# Patient Record
Sex: Female | Born: 1980 | Race: Black or African American | Hispanic: No | Marital: Single | Smoking: Former smoker
Health system: Southern US, Community
[De-identification: ages and names within clinical notes are randomized; demographics above are authoritative.]

## PROBLEM LIST (undated history)

## (undated) DIAGNOSIS — C801 Malignant (primary) neoplasm, unspecified: Secondary | ICD-10-CM

## (undated) DIAGNOSIS — N938 Other specified abnormal uterine and vaginal bleeding: Secondary | ICD-10-CM

## (undated) DIAGNOSIS — D649 Anemia, unspecified: Secondary | ICD-10-CM

## (undated) DIAGNOSIS — R1900 Intra-abdominal and pelvic swelling, mass and lump, unspecified site: Secondary | ICD-10-CM

## (undated) DIAGNOSIS — F32A Depression, unspecified: Secondary | ICD-10-CM

## (undated) DIAGNOSIS — R590 Localized enlarged lymph nodes: Secondary | ICD-10-CM

## (undated) HISTORY — PX: TONSILLECTOMY: SUR1361

---

## 2014-03-04 ENCOUNTER — Emergency Department (HOSPITAL_COMMUNITY)
Admission: EM | Admit: 2014-03-04 | Discharge: 2014-03-04 | Disposition: A | Discharge status: Home / Self Care | Payer: Managed Care, Other (non HMO) | Attending: Emergency Medicine | Admitting: Emergency Medicine

## 2014-03-04 ENCOUNTER — Emergency Department (HOSPITAL_COMMUNITY): Payer: Managed Care, Other (non HMO)

## 2014-03-04 ENCOUNTER — Encounter (HOSPITAL_COMMUNITY): Payer: Self-pay | Admitting: Emergency Medicine

## 2014-03-04 DIAGNOSIS — Z720 Tobacco use: Secondary | ICD-10-CM | POA: Diagnosis not present

## 2014-03-04 DIAGNOSIS — S39012A Strain of muscle, fascia and tendon of lower back, initial encounter: Secondary | ICD-10-CM | POA: Insufficient documentation

## 2014-03-04 DIAGNOSIS — W500XXA Accidental hit or strike by another person, initial encounter: Secondary | ICD-10-CM | POA: Diagnosis not present

## 2014-03-04 DIAGNOSIS — R52 Pain, unspecified: Secondary | ICD-10-CM

## 2014-03-04 DIAGNOSIS — S3992XA Unspecified injury of lower back, initial encounter: Secondary | ICD-10-CM | POA: Diagnosis present

## 2014-03-04 DIAGNOSIS — Y9361 Activity, american tackle football: Secondary | ICD-10-CM | POA: Diagnosis not present

## 2014-03-04 DIAGNOSIS — Y92321 Football field as the place of occurrence of the external cause: Secondary | ICD-10-CM | POA: Insufficient documentation

## 2014-03-04 MED ORDER — CYCLOBENZAPRINE HCL 10 MG PO TABS: 10.0000 mg | ORAL_TABLET | Freq: Two times a day (BID) | ORAL | Status: DC | PRN

## 2014-03-04 NOTE — ED Provider Notes (Signed)
CSN: [id number]     Arrival date & time 03/04/14  0148 History   First MD Initiated Contact with Patient 03/04/14 [phone]     Chief Complaint  Patient presents with  . Back Pain      HPI Patient states that she was on the sideline football game with her sons yesterday.  She was struck and knocked down.  She initially had no pain.  About an hour or 2 later she developed some burning sensation in her low back with some pain going down right leg.  She denies any weakness or bowel or bladder incontinence. History reviewed. No pertinent past medical history. Past Surgical History  Procedure Laterality Date  . Cesarean section     No family history on file. History  Substance Use Topics  . Smoking status: Current Every Day Smoker -- 0.50 packs/day  . Smokeless tobacco: Not on file  . Alcohol Use: No   OB History   Grav Para Term Preterm Abortions TAB SAB Ect Mult Living                 Review of Systems  All other systems reviewed and are negative  Allergies  Review of patient's allergies indicates no known allergies.  Home Medications   Prior to Admission medications   Medication Sig Start Date End Date Taking? Authorizing Provider  cetirizine (ZYRTEC) 10 MG tablet Take 10 mg by mouth daily as needed for allergies.   Yes Historical Provider, MD  ibuprofen (ADVIL,MOTRIN) 200 MG tablet Take 800 mg by mouth every 6 (six) hours as needed for moderate pain.   Yes Historical Provider, MD  cyclobenzaprine (FLEXERIL) 10 MG tablet Take 1 tablet (10 mg total) by mouth 2 (two) times daily as needed for muscle spasms. 03/04/14   Dot Lanes, MD   BP 128/84  Pulse 64  Temp(Src) 98.4 F (36.9 C) (Oral)  Resp 16  SpO2 100%  LMP 02/18/2014 Physical Exam Physical Exam  Nursing note and vitals reviewed. Constitutional: She is oriented to person, place, and time. She appears well-developed and well-nourished. No distress.  HENT:  Head: Normocephalic and atraumatic.  Eyes: Pupils are  equal, round, and reactive to light.  Neck: Normal range of motion.  Cardiovascular: Normal rate and intact distal pulses.   Pulmonary/Chest: No respiratory distress.  Abdominal: Normal appearance. She exhibits no distension.  Musculoskeletal: Paraspinous muscular tenderness noted in the lumbar region. Neurological: She is alert and oriented to person, place, and time. No cranial nerve deficit.  no weakness in lower extremities.  Straight leg raises are negative. Skin: Skin is warm and dry. No rash noted.  Psychiatric: She has a normal mood and affect. Her behavior is normal.   ED Course  Procedures (including critical care time) Labs Review Labs Reviewed - No data to display  Imaging Review Dg Lumbar Spine Complete  03/04/2014   CLINICAL DATA:  Patient fell straight backwards yesterday. Back pain with radiation down the right leg. Walks with a limp.  EXAM: LUMBAR SPINE - COMPLETE 4+ VIEW  COMPARISON:  None.  FINDINGS: Sacralization of L5. Go normal alignment of the lumbar spine and facet joints. No vertebral compression deformities. Intervertebral disc space heights are preserved. No focal bone lesion or bone destruction. Bone cortex and trabecular architecture appear intact. Intrauterine device demonstrated in the pelvis.  IMPRESSION: Normal alignment of the lumbar spine. No displaced fractures identified.   Electronically Signed   By: Lucienne Capers M.D.   On: 03/04/2014 04:20  MDM   Final diagnoses:  Lumbar strain, initial encounter        Dot Lanes, MD 03/05/14 2102

## 2014-03-04 NOTE — ED Notes (Signed)
MD/PA's made aware of pt needing to be seen.

## 2014-03-04 NOTE — Discharge Instructions (Signed)
Back Pain, Adult Low back pain is very common. About 1 in 5 people have back pain.The cause of low back pain is rarely dangerous. The pain often gets better over time.About half of people with a sudden onset of back pain feel better in just 2 weeks. About 8 in 10 people feel better by 6 weeks.  CAUSES Some common causes of back pain include:  Strain of the muscles or ligaments supporting the spine.  Wear and tear (degeneration) of the spinal discs.  Arthritis.  Direct injury to the back. DIAGNOSIS Most of the time, the direct cause of low back pain is not known.However, back pain can be treated effectively even when the exact cause of the pain is unknown.Answering your caregiver's questions about your overall health and symptoms is one of the most accurate ways to make sure the cause of your pain is not dangerous. If your caregiver needs more information, he or she may order lab work or imaging tests (X-rays or MRIs).However, even if imaging tests show changes in your back, this usually does not require surgery. HOME CARE INSTRUCTIONS For many people, back pain returns.Since low back pain is rarely dangerous, it is often a condition that people can learn to manageon their own.   Remain active. It is stressful on the back to sit or stand in one place. Do not sit, drive, or stand in one place for more than 30 minutes at a time. Take short walks on level surfaces as soon as pain allows.Try to increase the length of time you walk each day.  Do not stay in bed.Resting more than 1 or 2 days can delay your recovery.  Do not avoid exercise or work.Your body is made to move.It is not dangerous to be active, even though your back may hurt.Your back will likely heal faster if you return to being active before your pain is gone.  Pay attention to your body when you bend and lift. Many people have less discomfortwhen lifting if they bend their knees, keep the load close to their bodies,and  avoid twisting. Often, the most comfortable positions are those that put less stress on your recovering back.  Find a comfortable position to sleep. Use a firm mattress and lie on your side with your knees slightly bent. If you lie on your back, put a pillow under your knees.  Only take over-the-counter or prescription medicines as directed by your caregiver. Over-the-counter medicines to reduce pain and inflammation are often the most helpful.Your caregiver may prescribe muscle relaxant drugs.These medicines help dull your pain so you can more quickly return to your normal activities and healthy exercise.  Put ice on the injured area.  Put ice in a plastic bag.  Place a towel between your skin and the bag.  Leave the ice on for 15-20 minutes, 03-04 times a day for the first 2 to 3 days. After that, ice and heat may be alternated to reduce pain and spasms.  Ask your caregiver about trying back exercises and gentle massage. This may be of some benefit.  Avoid feeling anxious or stressed.Stress increases muscle tension and can worsen back pain.It is important to recognize when you are anxious or stressed and learn ways to manage it.Exercise is a great option. SEEK MEDICAL CARE IF:  You have pain that is not relieved with rest or medicine.  You have pain that does not improve in 1 week.  You have new symptoms.  You are generally not feeling well. SEEK   IMMEDIATE MEDICAL CARE IF:   You have pain that radiates from your back into your legs.  You develop new bowel or bladder control problems.  You have unusual weakness or numbness in your arms or legs.  You develop nausea or vomiting.  You develop abdominal pain.  You feel faint. Document Released: 04/27/2005 Document Revised: 10/27/2011 Document Reviewed: 08/29/2013 [employer] Patient Information 2015 [employer]. This information is not intended to replace advice given to you by your health care provider. Make sure you  discuss any questions you have with your health care provider.  

## 2014-03-04 NOTE — ED Notes (Signed)
Pt reports falling backwards yesterday.  Reports low back pain with a "knot".  Pt ambulatory with a limp

## 2014-10-28 ENCOUNTER — Encounter (HOSPITAL_COMMUNITY): Payer: Self-pay | Admitting: *Deleted

## 2014-10-28 ENCOUNTER — Emergency Department (HOSPITAL_COMMUNITY)
Admission: EM | Admit: 2014-10-28 | Discharge: 2014-10-28 | Disposition: A | Discharge status: Home / Self Care | Payer: Managed Care, Other (non HMO) | Attending: Emergency Medicine | Admitting: Emergency Medicine

## 2014-10-28 ENCOUNTER — Emergency Department (HOSPITAL_COMMUNITY): Payer: Managed Care, Other (non HMO)

## 2014-10-28 DIAGNOSIS — Z79899 Other long term (current) drug therapy: Secondary | ICD-10-CM | POA: Insufficient documentation

## 2014-10-28 DIAGNOSIS — S4991XA Unspecified injury of right shoulder and upper arm, initial encounter: Secondary | ICD-10-CM | POA: Insufficient documentation

## 2014-10-28 DIAGNOSIS — Z720 Tobacco use: Secondary | ICD-10-CM | POA: Insufficient documentation

## 2014-10-28 DIAGNOSIS — W51XXXA Accidental striking against or bumped into by another person, initial encounter: Secondary | ICD-10-CM | POA: Insufficient documentation

## 2014-10-28 DIAGNOSIS — M25511 Pain in right shoulder: Secondary | ICD-10-CM

## 2014-10-28 DIAGNOSIS — Y9361 Activity, american tackle football: Secondary | ICD-10-CM | POA: Insufficient documentation

## 2014-10-28 DIAGNOSIS — Y92321 Football field as the place of occurrence of the external cause: Secondary | ICD-10-CM | POA: Insufficient documentation

## 2014-10-28 DIAGNOSIS — Y998 Other external cause status: Secondary | ICD-10-CM | POA: Insufficient documentation

## 2014-10-28 MED ORDER — TRAMADOL HCL 50 MG PO TABS: 50.0000 mg | ORAL_TABLET | Freq: Four times a day (QID) | ORAL | Status: DC | PRN

## 2014-10-28 MED ORDER — DICLOFENAC SODIUM 1 % TD GEL: 2.0000 g | Freq: Four times a day (QID) | TRANSDERMAL | Status: DC

## 2014-10-28 MED ORDER — DIAZEPAM 5 MG PO TABS: 5.0000 mg | ORAL_TABLET | Freq: Two times a day (BID) | ORAL | Status: DC

## 2014-10-28 MED ORDER — KETOROLAC TROMETHAMINE 60 MG/2ML IM SOLN
60.0000 mg | Freq: Once | INTRAMUSCULAR | Status: AC
  Administered 2014-10-28: 60 mg via INTRAMUSCULAR
  Filled 2014-10-28: qty 2

## 2014-10-28 NOTE — ED Provider Notes (Signed)
CSN: [id number]     Arrival date & time 10/28/14  1922 History   First MD Initiated Contact with Patient 10/28/14 2016     Chief Complaint  Patient presents with  . Shoulder Injury     (Consider location/radiation/quality/duration/timing/severity/associated sxs/prior Treatment) HPI Comments: 34 year old female with no sick and past medical history presents to the emergency department for further evaluation of right shoulder pain. Patient reports that she was playing football with her children 2 days ago when she was tackled to the ground. She reports falling on her back; she does not recall whether she fell more on her right side or right shoulder. She states that her shoulder began hurting her yesterday and the pain has been constant. She describes the pain as a throbbing, tightening pain. She will intermittently feel paresthesias down her right arm. She denies any complete numbness or weakness in her right upper extremity. She has been taking Tylenol and ibuprofen without relief of symptoms. No history of prior injury to right shoulder.  Patient is a 34 y.o. female presenting with shoulder injury. The history is provided by the patient. No language interpreter was used.  Shoulder Injury Associated symptoms include arthralgias and myalgias. Pertinent negatives include no numbness or weakness.    History reviewed. No pertinent past medical history. Past Surgical History  Procedure Laterality Date  . Cesarean section     No family history on file. History  Substance Use Topics  . Smoking status: Current Every Day Smoker -- 0.50 packs/day  . Smokeless tobacco: Not on file  . Alcohol Use: No   OB History    No data available      Review of Systems  Musculoskeletal: Positive for myalgias and arthralgias.  Neurological: Negative for weakness and numbness.       Positive for paresthesias  All other systems reviewed and are negative.   Allergies  Review of patient's allergies  indicates no known allergies.  Home Medications   Prior to Admission medications   Medication Sig Start Date End Date Taking? Authorizing Provider  cetirizine (ZYRTEC) 10 MG tablet Take 10 mg by mouth daily as needed for allergies.    Historical Provider, MD  cyclobenzaprine (FLEXERIL) 10 MG tablet Take 1 tablet (10 mg total) by mouth 2 (two) times daily as needed for muscle spasms. 03/04/14   Leonard Schwartz, MD  diazepam (VALIUM) 5 MG tablet Take 1 tablet (5 mg total) by mouth 2 (two) times daily. 10/28/14   Antonietta Breach, PA-C  diclofenac sodium (VOLTAREN) 1 % GEL Apply 2 g topically 4 (four) times daily. 10/28/14   Antonietta Breach, PA-C  ibuprofen (ADVIL,MOTRIN) 200 MG tablet Take 800 mg by mouth every 6 (six) hours as needed for moderate pain.    Historical Provider, MD  traMADol (ULTRAM) 50 MG tablet Take 1 tablet (50 mg total) by mouth every 6 (six) hours as needed. 10/28/14   Antonietta Breach, PA-C   BP 138/82 mmHg  Pulse 93  Temp(Src) 98.4 F (36.9 C) (Oral)  Resp 18  Ht 6' (1.829 m)  Wt 209 lb 9 oz (95.057 kg)  BMI 28.42 kg/m2  SpO2 100%  LMP 10/28/2014   Physical Exam  Constitutional: She is oriented to person, place, and time. She appears well-developed and well-nourished. No distress.  Nontoxic/nonseptic appearing.  HENT:  Head: Normocephalic and atraumatic.  Eyes: Conjunctivae and EOM are normal. No scleral icterus.  Neck: Normal range of motion.  Cardiovascular: Normal rate, regular rhythm and intact distal pulses.  Distal radial pulse 2+ in the right upper extremity. Capillary refill brisk in all digits of right hand.  Pulmonary/Chest: Effort normal. No respiratory distress.  Musculoskeletal:       Right shoulder: She exhibits decreased range of motion (Decreased active range of motion of the right shoulder past 90 secondary to discomfort), tenderness, pain and spasm. She exhibits no bony tenderness, no swelling, no crepitus, no deformity, normal pulse and normal strength.        Cervical back: Normal.       Arms: Tenderness to palpation to posterior right shoulder with spasm. No crepitus or deformity noted. No bony tenderness.  Neurological: She is alert and oriented to person, place, and time. She exhibits normal muscle tone. Coordination normal.  Sensation to light touch intact in bilateral upper extremities. Patient has 5/5 grip strength and strength against resistance in all major muscle groups in the bilateral upper extremities. GCS 15.  Skin: Skin is warm and dry. No rash noted. She is not diaphoretic. No erythema. No pallor.  Psychiatric: She has a normal mood and affect. Her behavior is normal.  Nursing note and vitals reviewed.   ED Course  Procedures (including critical care time) Labs Review Labs Reviewed - No data to display  Imaging Review Dg Shoulder Right  10/28/2014   CLINICAL DATA:  Tackled playing football with right shoulder pain. Initial encounter.  EXAM: RIGHT SHOULDER - 2+ VIEW  COMPARISON:  None.  FINDINGS: There is no evidence of fracture or dislocation. Soft tissues are unremarkable.  IMPRESSION: Negative.   Electronically Signed   By: Monte Fantasia M.D.   On: 10/28/2014 20:01     EKG Interpretation None      MDM   Final diagnoses:  Right shoulder pain    34 year old female presents to the emergency department for further evaluation of right shoulder pain or playing tackle football 2 days ago. Patient is neurovascularly intact. X-ray negative for fracture, dislocation, or bony deformity. Patient exhibits good passive range of motion of her right shoulder joint. There is a muscle spasm appreciated on exam. Symptoms consistent with MSK etiology. Will manage as outpatient with Voltaren gel and Valium. Short course of Tramadol given for pain. Orthopedic referral given should symptoms persist. Return precautions discussed and provided. Patient agreeable to plan with no unaddressed concerns. Patient discharged in good condition.   Filed  Vitals:   10/28/14 1926 10/28/14 2045  BP: 138/82 128/78  Pulse: 93 70  Temp: 98.4 F (36.9 C) 98.4 F (36.9 C)  TempSrc: Oral Oral  Resp: 18 18  Height: 6' (1.829 m)   Weight: 209 lb 9 oz (95.057 kg)   SpO2: 100% 100%     Antonietta Breach, PA-C 10/28/14 2049  Dorie Rank, MD 10/29/14 1226

## 2014-10-28 NOTE — Discharge Instructions (Signed)
Recommend the use of Voltaren gel and Valium as prescribed. You may take tramadol as needed for severe pain. Alternate ice and heat to areas of injury 3-4 times per day for 15-20 minutes each time. Recommend frequent stretching of your right shoulder. Follow-up with orthopedics if symptoms persist.  Shoulder Sprain A shoulder sprain is the result of damage to the tough, fiber-like tissues (ligaments) that help hold your shoulder in place. The ligaments may be stretched or torn. Besides the main shoulder joint (the ball and socket), there are several smaller joints that connect the bones in this area. A sprain usually involves one of those joints. Most often it is the acromioclavicular (or AC) joint. That is the joint that connects the collarbone (clavicle) and the shoulder blade (scapula) at the top point of the shoulder blade (acromion). A shoulder sprain is a mild form of what is called a shoulder separation. Recovering from a shoulder sprain may take some time. For some, pain lingers for several months. Most people recover without long term problems. CAUSES   A shoulder sprain is usually caused by some kind of trauma. This might be:  Falling on an outstretched arm.  Being hit hard on the shoulder.  Twisting the arm.  Shoulder sprains are more likely to occur in people who:  Play sports.  Have balance or coordination problems. SYMPTOMS   Pain when you move your shoulder.  Limited ability to move the shoulder.  Swelling and tenderness on top of the shoulder.  Redness or warmth in the shoulder.  Bruising.  A change in the shape of the shoulder. DIAGNOSIS  Your healthcare provider may:  Ask about your symptoms.  Ask about recent activity that might have caused those symptoms.  Examine your shoulder. You may be asked to do simple exercises to test movement. The other shoulder will be examined for comparison.  Order some tests that provide a look inside the body. They can show  the extent of the injury. The tests could include:  X-rays.  CT (computed tomography) scan.  MRI (magnetic resonance imaging) scan. RISKS AND COMPLICATIONS  Loss of full shoulder motion.  Ongoing shoulder pain. TREATMENT  How long it takes to recover from a shoulder sprain depends on how severe it was. Treatment options may include:  Rest. You should not use the arm or shoulder until it heals.  Ice. For 2 or 3 days after the injury, put an ice pack on the shoulder up to 4 times a day. It should stay on for 15 to 20 minutes each time. Wrap the ice in a towel so it does not touch your skin.  Over-the-counter medicine to relieve pain.  A sling or brace. This will keep the arm still while the shoulder is healing.  Physical therapy or rehabilitation exercises. These will help you regain strength and motion. Ask your healthcare provider when it is OK to begin these exercises.  Surgery. The need for surgery is rare with a sprained shoulder, but some people may need surgery to keep the joint in place and reduce pain. HOME CARE INSTRUCTIONS   Ask your healthcare provider about what you should and should not do while your shoulder heals.  Make sure you know how to apply ice to the correct area of your shoulder.  Talk with your healthcare provider about which medications should be used for pain and swelling.  If rehabilitation therapy will be needed, ask your healthcare provider to refer you to a therapist. If it is  not recommended, then ask about at-home exercises. Find out when exercise should begin. SEEK MEDICAL CARE IF:  Your pain, swelling, or redness at the joint increases. SEEK IMMEDIATE MEDICAL CARE IF:   You have a fever.  You cannot move your arm or shoulder. Document Released: 09/13/2008 Document Revised: 07/20/2011 Document Reviewed: 09/13/2008 [facility] Patient Information 2015 [place], [place]. This information is not intended to replace advice given to you by your health  care provider. Make sure you discuss any questions you have with your health care provider.

## 2014-10-28 NOTE — ED Notes (Signed)
The pt is c/o rt shoulder pain since Friday when she was playing football with her children.  lmp now

## 2014-10-28 NOTE — ED Notes (Signed)
Pt. Left with all belongings and refused wheelchair 

## 2015-01-04 ENCOUNTER — Emergency Department (HOSPITAL_COMMUNITY)
Admission: EM | Admit: 2015-01-04 | Discharge: 2015-01-04 | Disposition: A | Discharge status: Home / Self Care | Payer: Managed Care, Other (non HMO) | Attending: Emergency Medicine | Admitting: Emergency Medicine

## 2015-01-04 ENCOUNTER — Encounter (HOSPITAL_COMMUNITY): Payer: Self-pay | Admitting: Emergency Medicine

## 2015-01-04 ENCOUNTER — Emergency Department (HOSPITAL_COMMUNITY): Payer: Managed Care, Other (non HMO)

## 2015-01-04 DIAGNOSIS — Y9389 Activity, other specified: Secondary | ICD-10-CM | POA: Insufficient documentation

## 2015-01-04 DIAGNOSIS — S93501A Unspecified sprain of right great toe, initial encounter: Secondary | ICD-10-CM

## 2015-01-04 DIAGNOSIS — Z79899 Other long term (current) drug therapy: Secondary | ICD-10-CM | POA: Insufficient documentation

## 2015-01-04 DIAGNOSIS — Z720 Tobacco use: Secondary | ICD-10-CM | POA: Insufficient documentation

## 2015-01-04 DIAGNOSIS — Y9289 Other specified places as the place of occurrence of the external cause: Secondary | ICD-10-CM | POA: Insufficient documentation

## 2015-01-04 DIAGNOSIS — W010XXA Fall on same level from slipping, tripping and stumbling without subsequent striking against object, initial encounter: Secondary | ICD-10-CM | POA: Insufficient documentation

## 2015-01-04 DIAGNOSIS — Y998 Other external cause status: Secondary | ICD-10-CM | POA: Insufficient documentation

## 2015-01-04 MED ORDER — NAPROXEN 500 MG PO TABS: 500.0000 mg | ORAL_TABLET | Freq: Two times a day (BID) | ORAL | Status: DC

## 2015-01-04 MED ORDER — TRAMADOL HCL 50 MG PO TABS: 50.0000 mg | ORAL_TABLET | Freq: Four times a day (QID) | ORAL | Status: DC | PRN

## 2015-01-04 MED ORDER — IBUPROFEN 800 MG PO TABS
800.0000 mg | ORAL_TABLET | Freq: Once | ORAL | Status: AC
  Administered 2015-01-04: 800 mg via ORAL
  Filled 2015-01-04: qty 2

## 2015-01-04 NOTE — ED Notes (Signed)
Pt. tripped and injured her right foot today , presents with pain and swelling , pain increases with movement / walking .

## 2015-01-04 NOTE — ED Provider Notes (Signed)
CSN: [id number]     Arrival date & time 01/04/15  0059 History  This chart was scribed for Delora Fuel, MD by Eustaquio Maize, ED Scribe. This patient was seen in room B16C/B16C and the patient's care was started at 1:38 AM.  Chief Complaint  Patient presents with  . Foot Injury   The history is provided by the patient. No language interpreter was used.     HPI Comments: Caitlin Maddox is a 34 y.o. female who presents to the Emergency Department complaining of sudden onset, constant, 9/10, right great toe pain that occurred earlier today after tripping on an object. Pt is not able to bear weight directly onto right foot. She denies numbness, weakness, tingling, or any other associated symptoms.   History reviewed. No pertinent past medical history. Past Surgical History  Procedure Laterality Date  . Cesarean section     No family history on file. Social History  Substance Use Topics  . Smoking status: Current Every Day Smoker -- 0.00 packs/day    Types: Cigarettes  . Smokeless tobacco: None  . Alcohol Use: No   OB History    No data available     Review of Systems  All other systems reviewed and are negative.  Allergies  Review of patient's allergies indicates no known allergies.  Home Medications   Prior to Admission medications   Medication Sig Start Date End Date Taking? Authorizing Provider  cetirizine (ZYRTEC) 10 MG tablet Take 10 mg by mouth daily as needed for allergies.    Historical Provider, MD  cyclobenzaprine (FLEXERIL) 10 MG tablet Take 1 tablet (10 mg total) by mouth 2 (two) times daily as needed for muscle spasms. 03/04/14   Leonard Schwartz, MD  diazepam (VALIUM) 5 MG tablet Take 1 tablet (5 mg total) by mouth 2 (two) times daily. 10/28/14   Antonietta Breach, PA-C  diclofenac sodium (VOLTAREN) 1 % GEL Apply 2 g topically 4 (four) times daily. 10/28/14   Antonietta Breach, PA-C  ibuprofen (ADVIL,MOTRIN) 200 MG tablet Take 800 mg by mouth every 6 (six) hours as needed for  moderate pain.    Historical Provider, MD  traMADol (ULTRAM) 50 MG tablet Take 1 tablet (50 mg total) by mouth every 6 (six) hours as needed. 10/28/14   Antonietta Breach, PA-C   Triage Vitals: BP 166/107 mmHg  Pulse 85  Temp(Src) 97.6 F (36.4 C) (Oral)  Resp 14  SpO2 100%  LMP 12/16/2014   Physical Exam  Constitutional: She is oriented to person, place, and time. She appears well-developed and well-nourished. No distress.  HENT:  Head: Normocephalic and atraumatic.  Eyes: EOM are normal. Pupils are equal, round, and reactive to light.  Neck: Normal range of motion. Neck supple. No JVD present.  Cardiovascular: Normal rate, regular rhythm and normal heart sounds.  Exam reveals no gallop and no friction rub.   No murmur heard. Pulmonary/Chest: Effort normal and breath sounds normal. She has no wheezes. She has no rales. She exhibits no tenderness.  Abdominal: Soft. Bowel sounds are normal. She exhibits no distension and no mass. There is no tenderness.  Musculoskeletal: Normal range of motion. She exhibits tenderness. She exhibits no edema.  Moderate tenderness right first MTP joint No swelling  Lymphadenopathy:    She has no cervical adenopathy.  Neurological: She is alert and oriented to person, place, and time. No cranial nerve deficit. She exhibits normal muscle tone. Coordination normal.  Skin: Skin is warm and dry. No rash noted.  Psychiatric:  She has a normal mood and affect. Her behavior is normal. Judgment and thought content normal.  Nursing note and vitals reviewed.   ED Course  Procedures (including critical care time)  DIAGNOSTIC STUDIES: Oxygen Saturation is 100% on RA, normal by my interpretation.    COORDINATION OF CARE: 1:41 AM-Discussed treatment plan which includes RICE therapy, Rx Naproxen, and crutches with pt at bedside and pt agreed to plan.   Imaging Review Dg Foot 2 Views Right  01/04/2015   CLINICAL DATA:  Right foot pain after injury. Tripped over a root  today. Pain at the first metatarsal phalangeal joint area.  EXAM: RIGHT FOOT - 2 VIEW  COMPARISON:  None.  FINDINGS: No fracture or dislocation. The alignment and joint spaces are maintained. Small well corticated densities about the medial first metatarsal appear chronic. There is no focal soft tissue abnormality.  IMPRESSION: No acute fracture or dislocation of the right foot.   Electronically Signed   By: Jeb Levering M.D.   On: 01/04/2015 01:38   I have personally reviewed and evaluated these images as part of my medical decision-making.   MDM   Final diagnoses:  Fall from slip, trip, or stumble, initial encounter  Sprain of right great toe, initial encounter    Toe injury which clinically appears to be a sprain. X-rays confirm no evidence of fracture. She is placed on a postop shoe and given crutches for comfort and discharged with prescriptions for naproxen and tramadol. Follow up with orthopedics as needed.   I personally performed the services described in this documentation, which was scribed in my presence. The recorded information has been reviewed and is accurate.       Delora Fuel, MD 58/83/25 [id number]

## 2015-01-04 NOTE — Discharge Instructions (Signed)
Wear post-op shoe, use crutches as needed.  Turf Toe Turf toe is a condition of pain at the base of the big toe, located at the ball of the foot. The condition is usually caused from either jamming or extending the toe beyond normal limits (hyperextension). This is the result of pushing off repeatedly when running or jumping. The main problem is pain at the base of the toe, but there may also be stiffness and swelling. The name turf toe comes from the fact that this injury is especially common among athletes who play on hard surfaces, such as artificial turf and basketball courts. Hard surfaces combined with running and jumping makes this a common sports injury. DIAGNOSIS  The diagnosis of turftoeisnotdifficult. It is made by examination. X-rays may be taken to make sure there is nobreak in the bone (fracture). Not doing surgery (conservative treatment) solves the problem most of the time. Conservative treatment includes the following home care instructions. HOME CARE INSTRUCTIONS   Apply ice to the sore area for 15-20 minutes, 03-04 times per day while awake, for the first 4 days. Put the ice in a plastic bag and place a towel between the bag of ice and your skin. Use ice if possible following any activities, even after the first four days.  Keep your leg elevated when possible to lessen swelling and discomfort in the toe.  Use crutches with non-weight bearing on the affected foot for ten days, or as needed for pain. Then you may walk as the pain allows, or as instructed. Start gradually with weight bearing on the affected foot. Shoes with stiff soles will generally be helpful in limiting pain for the first 1 to 2 weeks.  Continue to use crutches or a cane until you can stand on your foot without causing pain.  Only take over-the-counter or prescription medicines for pain, discomfort, or fever as directed by your caregiver. SEEK IMMEDIATE MEDICAL CARE IF:  1. You have an increase in bruising,  swelling, or pain in your toe. 2. Pain relief is not obtained with medications. Turf toe can return, and problems may be slow to improve. This is more common if you return to athletic activities too soon and do not allow the problem to fully recover. Surgery is rarely needed, but in certain cases it may be necessary. If a bone spur forms and severely limits motion of the toe joint, surgery to remove the spur and improve motion of the big toe may be helpful. Document Released: 10/17/2001 Document Revised: 07/20/2011 Document Reviewed: 10/02/2008 [facility] Patient Information 2015 [place], [place]. This information is not intended to replace advice given to you by your health care provider. Make sure you discuss any questions you have with your health care provider.  Crutch Use Crutches are used to take weight off one of your legs or feet when you stand or walk. It is important to use crutches that fit properly. When fitted properly:  Each crutch should be 2-3 finger widths below the armpit.  Your weight should be supported by your hand, and not by resting the armpit on the crutch.  RISKS AND COMPLICATIONS Damage to the nerves that extend from your armpit to your hand and arm. To prevent this from happening, make sure your crutches fit properly and do not put pressure on your armpit when using them. HOW TO USE YOUR CRUTCHES If you have been instructed to use partial weight bearing, apply (bear) the amount of weight as your health care provider suggests.  Do not bear weight in an amount that causes pain to the area of injury. Walking 3. Step with the crutches. 4. Swing the healthy leg slightly ahead of the crutches. Going Up Steps If there is no handrail: 1. Step up with the healthy leg. 2. Step up with the crutches and injured leg. 3. Continue in this way. If there is a handrail: 1. Hold both crutches in one hand. 2. Place your free hand on the handrail. 3. While putting your weight on your  arms, lift your healthy leg to the step. 4. Bring the crutches and the injured leg up to that step. 5. Continue in this way. Going Down Steps Be very careful, as going down stairs with crutches is very challenging. If there is no handrail: 1. Step down with the injured leg and crutches. 2. Step down with the healthy leg. If there is a handrail: 1. Place your hand on the handrail. 2. Hold both crutches with your free hand. 3. Lower your injured leg and crutch to the step below you. Make sure to keep the crutch tips in the center of the step, never on the edge. 4. Lower your healthy leg to that step. 5. Continue in this way. Standing Up 1. Hold the injured leg forward. 2. Grab the armrest with one hand and the top of the crutches with the other hand. 3. Using these supports, pull yourself up to a standing position. Sitting Down 1. Hold the injured leg forward. 2. Grab the armrest with one hand and the top of the crutches with the other hand. 3. Lower yourself to a sitting position. SEEK MEDICAL CARE IF:  You still feel unsteady on your feet.  You develop new pain, for example in your armpits, back, shoulder, wrist, or hip.  You develop any numbness or tingling. SEEK IMMEDIATE MEDICAL CARE IF: You fall. Document Released: 04/24/2000 Document Revised: 05/02/2013 Document Reviewed: 01/02/2013 [facility] Patient Information 2015 [place], [place]. This information is not intended to replace advice given to you by your health care provider. Make sure you discuss any questions you have with your health care provider.  Naproxen and naproxen sodium oral immediate-release tablets What is this medicine? NAPROXEN (na PROX en) is a non-steroidal anti-inflammatory drug (NSAID). It is used to reduce swelling and to treat pain. This medicine may be used for dental pain, headache, or painful monthly periods. It is also used for painful joint and muscular problems such as arthritis, tendinitis, bursitis,  and gout. This medicine may be used for other purposes; ask your health care provider or pharmacist if you have questions. COMMON BRAND NAME(S): Aflaxen, Aleve, Aleve Arthritis, All Day Relief, Anaprox, Anaprox DS, Naprosyn What should I tell my health care provider before I take this medicine? They need to know if you have any of these conditions: -asthma -cigarette smoker -drink more than 3 alcohol containing drinks a day -heart disease or circulation problems such as heart failure or leg edema (fluid retention) -high blood pressure -kidney disease -liver disease -stomach bleeding or ulcers -an unusual or allergic reaction to naproxen, aspirin, other NSAIDs, other medicines, foods, dyes, or preservatives -pregnant or trying to get pregnant -breast-feeding How should I use this medicine? Take this medicine by mouth with a glass of water. Follow the directions on the prescription label. Take it with food if your stomach gets upset. Try to not lie down for at least 10 minutes after you take it. Take your medicine at regular intervals. Do not take your  medicine more often than directed. Long-term, continuous use may increase the risk of heart attack or stroke. A special MedGuide will be given to you by the pharmacist with each prescription and refill. Be sure to read this information carefully each time. Talk to your pediatrician regarding the use of this medicine in children. Special care may be needed. Overdosage: If you think you have taken too much of this medicine contact a poison control center or emergency room at once. NOTE: This medicine is only for you. Do not share this medicine with others. What if I miss a dose? If you miss a dose, take it as soon as you can. If it is almost time for your next dose, take only that dose. Do not take double or extra doses. What may interact with this medicine? -alcohol -aspirin -cidofovir -diuretics -lithium -methotrexate -other drugs for  inflammation like ketorolac or prednisone -pemetrexed -probenecid -warfarin This list may not describe all possible interactions. Give your health care provider a list of all the medicines, herbs, non-prescription drugs, or dietary supplements you use. Also tell them if you smoke, drink alcohol, or use illegal drugs. Some items may interact with your medicine. What should I watch for while using this medicine? Tell your doctor or health care professional if your pain does not get better. Talk to your doctor before taking another medicine for pain. Do not treat yourself. This medicine does not prevent heart attack or stroke. In fact, this medicine may increase the chance of a heart attack or stroke. The chance may increase with longer use of this medicine and in people who have heart disease. If you take aspirin to prevent heart attack or stroke, talk with your doctor or health care professional. Do not take other medicines that contain aspirin, ibuprofen, or naproxen with this medicine. Side effects such as stomach upset, nausea, or ulcers may be more likely to occur. Many medicines available without a prescription should not be taken with this medicine. This medicine can cause ulcers and bleeding in the stomach and intestines at any time during treatment. Do not smoke cigarettes or drink alcohol. These increase irritation to your stomach and can make it more susceptible to damage from this medicine. Ulcers and bleeding can happen without warning symptoms and can cause death. You may get drowsy or dizzy. Do not drive, use machinery, or do anything that needs mental alertness until you know how this medicine affects you. Do not stand or sit up quickly, especially if you are an older patient. This reduces the risk of dizzy or fainting spells. This medicine can cause you to bleed more easily. Try to avoid damage to your teeth and gums when you brush or floss your teeth. What side effects may I notice from  receiving this medicine? Side effects that you should report to your doctor or health care professional as soon as possible: -black or bloody stools, blood in the urine or vomit -blurred vision -chest pain -difficulty breathing or wheezing -nausea or vomiting -severe stomach pain -skin rash, skin redness, blistering or peeling skin, hives, or itching -slurred speech or weakness on one side of the body -swelling of eyelids, throat, lips -unexplained weight gain or swelling -unusually weak or tired -yellowing of eyes or skin Side effects that usually do not require medical attention (report to your doctor or health care professional if they continue or are bothersome): -constipation -headache -heartburn This list may not describe all possible side effects. Call your doctor for medical advice  about side effects. You may report side effects to FDA at 1-800-FDA-1088. Where should I keep my medicine? Keep out of the reach of children. Store at room temperature between 15 and 30 degrees C (59 and 86 degrees F). Keep container tightly closed. Throw away any unused medicine after the expiration date. NOTE: This sheet is a summary. It may not cover all possible information. If you have questions about this medicine, talk to your doctor, pharmacist, or health care provider.  2015, Elsevier/Gold Standard. (2009-04-29 20:10:16)  Tramadol tablets What is this medicine? TRAMADOL (TRA ma dole) is a pain reliever. It is used to treat moderate to severe pain in adults. This medicine may be used for other purposes; ask your health care provider or pharmacist if you have questions. COMMON BRAND NAME(S): Ultram What should I tell my health care provider before I take this medicine? They need to know if you have any of these conditions: -brain tumor -depression -drug abuse or addiction -head injury -if you frequently drink alcohol containing drinks -kidney disease or trouble passing urine -liver  disease -lung disease, asthma, or breathing problems -seizures or epilepsy -suicidal thoughts, plans, or attempt; a previous suicide attempt by you or a family member -an unusual or allergic reaction to tramadol, codeine, other medicines, foods, dyes, or preservatives -pregnant or trying to get pregnant -breast-feeding How should I use this medicine? Take this medicine by mouth with a full glass of water. Follow the directions on the prescription label. If the medicine upsets your stomach, take it with food or milk. Do not take more medicine than you are told to take. Talk to your pediatrician regarding the use of this medicine in children. Special care may be needed. Overdosage: If you think you have taken too much of this medicine contact a poison control center or emergency room at once. NOTE: This medicine is only for you. Do not share this medicine with others. What if I miss a dose? If you miss a dose, take it as soon as you can. If it is almost time for your next dose, take only that dose. Do not take double or extra doses. What may interact with this medicine? Do not take this medicine with any of the following medications: -MAOIs like Carbex, Eldepryl, Marplan, Nardil, and Parnate This medicine may also interact with the following medications: -alcohol or medicines that contain alcohol -antihistamines -benzodiazepines -bupropion -carbamazepine or oxcarbazepine -clozapine -cyclobenzaprine -digoxin -furazolidone -linezolid -medicines for depression, anxiety, or psychotic disturbances -medicines for migraine headache like almotriptan, eletriptan, frovatriptan, naratriptan, rizatriptan, sumatriptan, zolmitriptan -medicines for pain like pentazocine, buprenorphine, butorphanol, meperidine, nalbuphine, and propoxyphene -medicines for sleep -muscle relaxants -naltrexone -phenobarbital -phenothiazines like perphenazine, thioridazine, chlorpromazine, mesoridazine, fluphenazine,  prochlorperazine, promazine, and trifluoperazine -procarbazine -warfarin This list may not describe all possible interactions. Give your health care provider a list of all the medicines, herbs, non-prescription drugs, or dietary supplements you use. Also tell them if you smoke, drink alcohol, or use illegal drugs. Some items may interact with your medicine. What should I watch for while using this medicine? Tell your doctor or health care professional if your pain does not go away, if it gets worse, or if you have new or a different type of pain. You may develop tolerance to the medicine. Tolerance means that you will need a higher dose of the medicine for pain relief. Tolerance is normal and is expected if you take this medicine for a long time. Do not suddenly stop taking your medicine because  you may develop a severe reaction. Your body becomes used to the medicine. This does NOT mean you are addicted. Addiction is a behavior related to getting and using a drug for a non-medical reason. If you have pain, you have a medical reason to take pain medicine. Your doctor will tell you how much medicine to take. If your doctor wants you to stop the medicine, the dose will be slowly lowered over time to avoid any side effects. You may get drowsy or dizzy. Do not drive, use machinery, or do anything that needs mental alertness until you know how this medicine affects you. Do not stand or sit up quickly, especially if you are an older patient. This reduces the risk of dizzy or fainting spells. Alcohol can increase or decrease the effects of this medicine. Avoid alcoholic drinks. You may have constipation. Try to have a bowel movement at least every 2 to 3 days. If you do not have a bowel movement for 3 days, call your doctor or health care professional. Your mouth may get dry. Chewing sugarless gum or sucking hard candy, and drinking plenty of water may help. Contact your doctor if the problem does not go away or  is severe. What side effects may I notice from receiving this medicine? Side effects that you should report to your doctor or health care professional as soon as possible: -allergic reactions like skin rash, itching or hives, swelling of the face, lips, or tongue -breathing difficulties, wheezing -confusion -itching -light headedness or fainting spells -redness, blistering, peeling or loosening of the skin, including inside the mouth -seizures Side effects that usually do not require medical attention (report to your doctor or health care professional if they continue or are bothersome): -constipation -dizziness -drowsiness -headache -nausea, vomiting This list may not describe all possible side effects. Call your doctor for medical advice about side effects. You may report side effects to FDA at 1-800-FDA-1088. Where should I keep my medicine? Keep out of the reach of children. Store at room temperature between 15 and 30 degrees C (59 and 86 degrees F). Keep container tightly closed. Throw away any unused medicine after the expiration date. NOTE: This sheet is a summary. It may not cover all possible information. If you have questions about this medicine, talk to your doctor, pharmacist, or health care provider.  2015, Elsevier/Gold Standard. (2010-01-08 11:55:44)

## 2015-10-02 ENCOUNTER — Emergency Department (HOSPITAL_COMMUNITY)
Admission: EM | Admit: 2015-10-02 | Discharge: 2015-10-02 | Disposition: A | Discharge status: Home / Self Care | Payer: Managed Care, Other (non HMO) | Attending: Emergency Medicine | Admitting: Emergency Medicine

## 2015-10-02 ENCOUNTER — Encounter (HOSPITAL_COMMUNITY): Payer: Self-pay | Admitting: Emergency Medicine

## 2015-10-02 DIAGNOSIS — G479 Sleep disorder, unspecified: Secondary | ICD-10-CM | POA: Insufficient documentation

## 2015-10-02 DIAGNOSIS — K0889 Other specified disorders of teeth and supporting structures: Secondary | ICD-10-CM

## 2015-10-02 DIAGNOSIS — Z79899 Other long term (current) drug therapy: Secondary | ICD-10-CM | POA: Insufficient documentation

## 2015-10-02 DIAGNOSIS — F17210 Nicotine dependence, cigarettes, uncomplicated: Secondary | ICD-10-CM | POA: Insufficient documentation

## 2015-10-02 MED ORDER — PENICILLIN V POTASSIUM 500 MG PO TABS: 500.0000 mg | ORAL_TABLET | Freq: Four times a day (QID) | ORAL | Status: DC

## 2015-10-02 NOTE — ED Provider Notes (Signed)
CSN: [id number]     Arrival date & time 10/02/15  [id number] History   First MD Initiated Contact with Patient 10/02/15 [phone]     Chief Complaint  Patient presents with  . Dental Pain     (Consider location/radiation/quality/duration/timing/severity/associated sxs/prior Treatment) HPI Comments: Patient presents to the emergency department with chief complaint of right upper dental pain. She states that one of her molars broke a couple weeks ago. She states that she began having pain this morning. She states pain is severe. She is tried taking Tylenol and ibuprofen with no relief. She denies any fevers. She does not have a dentist. There are no modifying factors.  The history is provided by the patient. No language interpreter was used.    History reviewed. No pertinent past medical history. Past Surgical History  Procedure Laterality Date  . Cesarean section     No family history on file. Social History  Substance Use Topics  . Smoking status: Current Every Day Smoker -- 0.50 packs/day    Types: Cigarettes  . Smokeless tobacco: None  . Alcohol Use: No   OB History    No data available     Review of Systems  Constitutional: Negative for fever and chills.  HENT: Positive for dental problem. Negative for drooling.   Neurological: Negative for speech difficulty.  Psychiatric/Behavioral: Positive for sleep disturbance.      Allergies  Review of patient's allergies indicates no known allergies.  Home Medications   Prior to Admission medications   Medication Sig Start Date End Date Taking? Authorizing Provider  cetirizine (ZYRTEC) 10 MG tablet Take 10 mg by mouth daily as needed for allergies.    Historical Provider, MD  diclofenac sodium (VOLTAREN) 1 % GEL Apply 2 g topically 4 (four) times daily. 10/28/14   Antonietta Breach, PA-C  naproxen (NAPROSYN) 500 MG tablet Take 1 tablet (500 mg total) by mouth 2 (two) times daily. [id number]   Delora Fuel, MD  traMADol (ULTRAM) 50 MG tablet Take  1 tablet (50 mg total) by mouth every 6 (six) hours as needed. [id number]   Delora Fuel, MD   BP 99991111 mmHg  Pulse 86  Temp(Src) 98.6 F (37 C) (Oral)  Resp 18  Ht 6' (1.829 m)  Wt 96.616 kg  BMI 28.88 kg/m2  SpO2 98%  LMP 09/06/2015 Physical Exam Physical Exam  Constitutional: Pt appears well-developed and well-nourished.  HENT:  Head: Normocephalic.  Right Ear: Tympanic membrane, external ear and ear canal normal.  Left Ear: Tympanic membrane, external ear and ear canal normal.  Nose: Nose normal. Right sinus exhibits no maxillary sinus tenderness and no frontal sinus tenderness. Left sinus exhibits no maxillary sinus tenderness and no frontal sinus tenderness.  Mouth/Throat: Uvula is midline, oropharynx is clear and moist and mucous membranes are normal. No oral lesions. No uvula swelling or lacerations. No oropharyngeal exudate, posterior oropharyngeal edema, posterior oropharyngeal erythema or tonsillar abscesses.  Poor dentition No gingival swelling, fluctuance or induration No gross abscess  No sublingual edema, tenderness to palpation, or sign of Ludwig's angina, or deep space infection Pain at right upper premolar  Eyes: Conjunctivae are normal. Pupils are equal, round, and reactive to light. Right eye exhibits no discharge. Left eye exhibits no discharge.  Neck: Normal range of motion. Neck supple.  No stridor Handling secretions without difficulty No nuchal rigidity No cervical lymphadenopathy Cardiovascular: Normal rate, regular rhythm and normal heart sounds.   Pulmonary/Chest: Effort normal. No respiratory distress.  Equal chest rise  Abdominal: Soft. Bowel sounds are normal. Pt exhibits no distension. There is no tenderness.  Lymphadenopathy: Pt has no cervical adenopathy.  Neurological: Pt is alert and oriented x 4  Skin: Skin is warm and dry.  Psychiatric: Pt has a normal mood and affect.  Nursing note and vitals reviewed.   ED Course  Dental Date/Time:  10/02/2015 9:03 AM Performed by: Montine Circle Authorized by: Montine Circle Consent: Verbal consent obtained. Risks and benefits: risks, benefits and alternatives were discussed Consent given by: patient Patient understanding: patient states understanding of the procedure being performed Patient consent: the patient's understanding of the procedure matches consent given Procedure consent: procedure consent matches procedure scheduled Relevant documents: relevant documents present and verified Test results: test results available and properly labeled Site marked: the operative site was marked Imaging studies: imaging studies available Required items: required blood products, implants, devices, and special equipment available Patient identity confirmed: verbally with patient Preparation: Patient was prepped and draped in the usual sterile fashion. Local anesthesia used: yes Anesthesia: local infiltration Local anesthetic: bupivacaine 0.25% with epinephrine Patient sedated: no   (including critical care time)   MDM   Final diagnoses:  Pain, dental    Patient with dentalgia.  No abscess requiring immediate incision and drainage.  Exam not concerning for Ludwig's angina or pharyngeal abscess.  Will treat with penicillin and dental block. Pt instructed to follow-up with dentist.  Discussed return precautions. Pt safe for discharge.     Montine Circle, PA-C 10/02/15 [id number]  Tanna Furry, MD 10/14/15 Berniece Salines

## 2015-10-02 NOTE — ED Notes (Signed)
Patient states dental pain R upper molar.  Patient states broken tooth for a while, and has been taking tylenol and ibuprofen with no relief.   Patient states no dentist.

## 2015-10-02 NOTE — Discharge Instructions (Signed)

## 2015-10-09 DIAGNOSIS — G5690 Unspecified mononeuropathy of unspecified upper limb: Secondary | ICD-10-CM | POA: Insufficient documentation

## 2017-02-14 ENCOUNTER — Encounter (HOSPITAL_COMMUNITY): Payer: Self-pay | Admitting: *Deleted

## 2017-02-14 ENCOUNTER — Emergency Department (HOSPITAL_COMMUNITY): Payer: Self-pay

## 2017-02-14 ENCOUNTER — Emergency Department (HOSPITAL_COMMUNITY)
Admission: EM | Admit: 2017-02-14 | Discharge: 2017-02-15 | Disposition: A | Discharge status: Home / Self Care | Payer: Self-pay | Attending: Physician Assistant | Admitting: Physician Assistant

## 2017-02-14 DIAGNOSIS — M25562 Pain in left knee: Secondary | ICD-10-CM | POA: Insufficient documentation

## 2017-02-14 DIAGNOSIS — F17210 Nicotine dependence, cigarettes, uncomplicated: Secondary | ICD-10-CM | POA: Insufficient documentation

## 2017-02-14 DIAGNOSIS — K047 Periapical abscess without sinus: Secondary | ICD-10-CM | POA: Insufficient documentation

## 2017-02-14 DIAGNOSIS — M25561 Pain in right knee: Secondary | ICD-10-CM | POA: Insufficient documentation

## 2017-02-14 DIAGNOSIS — Z79899 Other long term (current) drug therapy: Secondary | ICD-10-CM | POA: Insufficient documentation

## 2017-02-14 LAB — I-STAT CHEM 8, ED
BUN: 6 mg/dL (ref 6–20)
Calcium, Ion: 1.16 mmol/L (ref 1.15–1.40)
Chloride: 106 mmol/L (ref 101–111)
Creatinine, Ser: 0.8 mg/dL (ref 0.44–1.00)
Glucose, Bld: 99 mg/dL (ref 65–99)
HCT: 30 % — ABNORMAL LOW (ref 36.0–46.0)
Hemoglobin: 10.2 g/dL — ABNORMAL LOW (ref 12.0–15.0)
Potassium: 3.6 mmol/L (ref 3.5–5.1)
Sodium: 140 mmol/L (ref 135–145)
TCO2: 25 mmol/L (ref 22–32)

## 2017-02-14 MED ORDER — IOPAMIDOL (ISOVUE-300) INJECTION 61%
INTRAVENOUS | Status: AC
  Administered 2017-02-14: 75 mL via INTRAVENOUS
  Filled 2017-02-14: qty 75

## 2017-02-14 MED ORDER — HYDROCODONE-ACETAMINOPHEN 5-325 MG PO TABS
1.0000 | ORAL_TABLET | Freq: Once | ORAL | Status: AC
  Administered 2017-02-14: 1 via ORAL
  Filled 2017-02-14: qty 1

## 2017-02-14 MED ORDER — CLINDAMYCIN HCL 150 MG PO CAPS
300.0000 mg | ORAL_CAPSULE | Freq: Once | ORAL | Status: AC
  Administered 2017-02-15: 300 mg via ORAL
  Filled 2017-02-14: qty 2

## 2017-02-14 NOTE — ED Triage Notes (Signed)
Pt tripping and falling Friday night. Having bilateral knee pain and right shoulder pain since. Is ambulatory at triage. Reports waking up this am with right side facial swelling and sinus congestion. Denies recent dental pain. Airway intact at triage.

## 2017-02-14 NOTE — Discharge Instructions (Signed)
Take antibiotics as directed. Please take all of your antibiotics until finished.  You can take Tylenol or Ibuprofen as directed for pain. You can take pain medication for severe or breakthrough pain.   You can apply warm compresses to the area.   You will need to follow-up with the referred dentist or one from the dental resource guide. Call and arrange an appointment.   The exam and treatment you received today has been provided on an emergency basis only. This is not a substitute for complete medical or dental care. If your problem worsens or new symptoms (problems) appear, and you are unable to arrange prompt follow-up care with your dentist, call or return to this location. If you do not have a dentist, please follow-up with one on the list provided  CALL YOUR DENTIST OR RETURN IMMEDIATELY IF you develop a fever, rash, difficulty breathing or swallowing, neck or facial swelling, or other potentially serious concerns.

## 2017-02-14 NOTE — ED Provider Notes (Signed)
[facility] DEPT Provider Note   CSN: [id number] Arrival date & time: 02/14/17  [place]     History   Chief Complaint Chief Complaint  Patient presents with  . Fall  . Facial Swelling    HPI Caitlin Maddox is a 36 y.o. female who presents with facial swelling that began this morning. Patient also reports that she had a mechanical fall 2 days ago and has had some persistent bilateral knee pain. Patient reports that she was walking down her front porch, when she missed a step causing her to fall forward landing on her right knees. Patient states that she did not hit her face, hit her head. She denies any loss of consciousness. Patient states that she was able to get up immediately after the incident. Patient states that she has been able to ambulate since the incident and airway underneath without clicks. Patient states that this morning, she woke up and started noticing some right-sided facial swelling, nasal congestion, watery discharge from her eyes. Patient states that she has taken Tylenol with no improvement in symptoms. Patient states that she is still able to drink, eat and swallow without any difficulty. She denies any oral angioedema. She denies any difficulty breathing. Patient states that she has no history of allergies and has not been exposed to any new or unusual foods. Patient states that she has overall poor dentition but denies any dental pain. Patient denies any fever, chest pain, difficulty breathing, trouble swallowing, neck swelling, oral angioedema, nausea/vomiting.  The history is provided by the patient.    History reviewed. No pertinent past medical history.  There are no active problems to display for this patient.   Past Surgical History:  Procedure Laterality Date  . CESAREAN SECTION      OB History    No data available       Home Medications    Prior to Admission medications   Medication Sig Start Date End Date Taking? Authorizing Provider    cetirizine (ZYRTEC) 10 MG tablet Take 10 mg by mouth daily as needed for allergies.    [provider]  clindamycin (CLEOCIN) 150 MG capsule Take 3 capsules (450 mg total) by mouth every 6 (six) hours. 02/14/17 02/21/17  Volanda Napoleon, PA-C  diclofenac sodium (VOLTAREN) 1 % GEL Apply 2 g topically 4 (four) times daily. 10/28/14   Antonietta Breach, PA-C  HYDROcodone-acetaminophen (NORCO/VICODIN) 5-325 MG tablet Take 1-2 tablets by mouth every 6 (six) hours as needed. 02/15/17   Volanda Napoleon, PA-C  naproxen (NAPROSYN) 500 MG tablet Take 1 tablet (500 mg total) by mouth 2 (two) times daily. 12/20/55   Delora Fuel, MD  penicillin v potassium (VEETID) 500 MG tablet Take 1 tablet (500 mg total) by mouth 4 (four) times daily. 10/02/15   Montine Circle, PA-C  traMADol (ULTRAM) 50 MG tablet Take 1 tablet (50 mg total) by mouth every 6 (six) hours as needed. 2/62/03   Delora Fuel, MD    Family History History reviewed. No pertinent family history.  Social History Social History  Substance Use Topics  . Smoking status: Current Every Day Smoker    Packs/day: 0.50    Types: Cigarettes  . Smokeless tobacco: Not on file  . Alcohol use No     Allergies   Patient has no known allergies.   Review of Systems Review of Systems  Constitutional: Negative for chills and fever.  HENT: Positive for facial swelling. Negative for dental problem and trouble swallowing.  Respiratory: Negative for cough and shortness of breath.   Cardiovascular: Negative for chest pain.  Gastrointestinal: Negative for abdominal pain, nausea and vomiting.  Musculoskeletal: Negative for neck pain.  Neurological: Negative for weakness and numbness.     Physical Exam Updated Vital Signs BP (!) 134/91   Pulse 76   Temp 98.4 F (36.9 C) (Oral)   Resp 16   Ht 6\' 1"  (1.854 m)   Wt 97.5 kg (215 lb)   LMP 02/14/2017   SpO2 98%   BMI 28.37 kg/m   Physical Exam  Constitutional: She is oriented to person,  place, and time. She appears well-developed and well-nourished.  HENT:  Head: Normocephalic and atraumatic.    Mouth/Throat: Oropharynx is clear and moist and mucous membranes are normal.  Right-sided facial swelling noted to the right cheek. No overlying warmth, erythema. Poor dentition chronically. She has some diffuse gum swelling but no gingival erythema. No discrete collection of fluctuance. No neck swelling. No trismus. Uvula is midline. No evidence of peritonsillar abscess.  Eyes: Pupils are equal, round, and reactive to light. Conjunctivae, EOM and lids are normal.  Neck: Full passive range of motion without pain.  Cardiovascular: Normal rate, regular rhythm, normal heart sounds and normal pulses.  Exam reveals no gallop and no friction rub.   No murmur heard. Pulmonary/Chest: Effort normal and breath sounds normal.  No evidence of respiratory distress. Able to speak in full sentences without difficulty.  Musculoskeletal: Normal range of motion.  Tenderness palpation to anterior aspect of the right knee. No overlying warmth, erythema, tenderness, ecchymosis. Negative anterior and posterior drawer test. No instability noted on varus or valgus stress. Tenderness palpation to anterior aspect of the left knee. No overlying warmth, erythema, tenderness, ecchymosis. Negative anterior and posterior drawer test. No instability noted on varus or valgus stress.  Neurological: She is alert and oriented to person, place, and time.  Skin: Skin is warm and dry. Capillary refill takes less than 2 seconds.  Psychiatric: She has a normal mood and affect. Her speech is normal.  Nursing note and vitals reviewed.    ED Treatments / Results  Labs (all labs ordered are listed, but only abnormal results are displayed) Labs Reviewed  I-STAT CHEM 8, ED - Abnormal; Notable for the following:       Result Value   Hemoglobin 10.2 (*)    HCT 30.0 (*)    All other components within normal limits    EKG   EKG Interpretation None       Radiology Ct Maxillofacial W Contrast  Result Date: 02/14/2017 CLINICAL DATA:  Right infraorbital facial swelling EXAM: CT MAXILLOFACIAL WITH CONTRAST TECHNIQUE: Multidetector CT imaging of the maxillofacial structures was performed with intravenous contrast. Multiplanar CT image reconstructions were also generated. CONTRAST:  39mL ISOVUE-300 IOPAMIDOL (ISOVUE-300) INJECTION 61% COMPARISON:  None. FINDINGS: Osseous: --Complex facial fracture types: No LeFort, zygomaticomaxillary complex or nasoorbitoethmoidal fracture. --Simple fracture types: None. --Mandible, hard palate and teeth: There is a periapical lucency at the root of tooth 4, which also as a large carie at its surface. There are numerous dental caries throughout the mouth. The cortex at the root of tooth 4 is discontinuous. There are similar findings to a lesser degree at the root of tooth 5. Orbits: The globes appear intact. Normal appearance of the intra- and extraconal fat. Symmetric extraocular muscles. Sinuses: No fluid levels or advanced mucosal thickening. Soft tissues: There is inflammatory fat infiltration of the right facial soft tissues, directly overlying  the periapical lucency described above. No abscess or drainable fluid collection. Limited intracranial: Normal. IMPRESSION: 1. Odontogenic cellulitis of the lower right face secondary to periapical lucency of tooth 4 with anterior osseous dehiscence. 2. No abscess or drainable fluid collection. 3. Extensive dental disease with numerous caries and periapical lucencies. Electronically Signed   By: Ulyses Jarred M.D.   On: 02/14/2017 23:38    Procedures Procedures (including critical care time)  Medications Ordered in ED Medications  HYDROcodone-acetaminophen (NORCO/VICODIN) 5-325 MG per tablet 1 tablet (1 tablet Oral Given 02/14/17 2258)  iopamidol (ISOVUE-300) 61 % injection (75 mLs Intravenous Contrast Given 02/14/17 2316)  clindamycin (CLEOCIN)  capsule 300 mg (300 mg Oral Given 02/15/17 0024)     Initial Impression / Assessment and Plan / ED Course  I have reviewed the triage vital signs and the nursing notes.  Pertinent labs & imaging results that were available during my care of the patient were reviewed by me and considered in my medical decision making (see chart for details).     36 year old female who presents with facial swelling that began this morning. Also presents with bilateral knee pain after a fall 2 days ago. Has been able to work. No fevers, oral angioedema, difficulty breathing. Patient is afebrile, non-toxic appearing, sitting comfortably on examination table. Vital signs reviewed and stable. No evidence of respiratory distress. Consider dental abscess. Low suspicion for cellulitis given appearance. Do not suspect symptoms are a result of trauma of falling given duration. History/physical exam are not concerning for PTA or Ludwig angina. Will obtain CT maxillofacial for evaluation. Low suspicion for fracture vs dislocation of bilateral knees. Given that patinet has been ambulating and bearing weight, do not suspect sprain. Offerred XR evaluation of bilateral knees. Patient declines at this time. Analgesics provided in the department.  CT reviewed. Patient has a periapical dental abscess. Discussed results with patients. Will plan to start on antibiotics. Will provide outpatient referral for dentists for patient to follow-up with. Vitals stable. Provided patient with a list of clinic resources to use if he does not have a PCP. Instructed to call them today to arrange follow-up in the next 24-48 hours. Strict return precautions discussed. Patient expresses understanding and agreement to plan.   Final Clinical Impressions(s) / ED Diagnoses   Final diagnoses:  Dental abscess    New Prescriptions Discharge Medication List as of 02/15/2017 12:01 AM    START taking these medications   Details  clindamycin (CLEOCIN) 150 MG  capsule Take 3 capsules (450 mg total) by mouth every 6 (six) hours., Starting Sun 02/14/2017, Until Sun 02/21/2017, Print    HYDROcodone-acetaminophen (NORCO/VICODIN) 5-325 MG tablet Take 1-2 tablets by mouth every 6 (six) hours as needed., Starting Mon 02/15/2017, Print         Volanda Napoleon, PA-C 02/15/17 2323    Macarthur Critchley, MD 02/17/17 [phone]

## 2017-02-15 MED ORDER — CLINDAMYCIN HCL 150 MG PO CAPS: 450.0000 mg | ORAL_CAPSULE | Freq: Four times a day (QID) | ORAL | 0 refills | Status: AC

## 2017-02-15 MED ORDER — HYDROCODONE-ACETAMINOPHEN 5-325 MG PO TABS: 1.0000 | ORAL_TABLET | Freq: Four times a day (QID) | ORAL | 0 refills | Status: DC | PRN

## 2017-02-16 ENCOUNTER — Emergency Department (HOSPITAL_COMMUNITY)
Admission: EM | Admit: 2017-02-16 | Discharge: 2017-02-16 | Disposition: A | Discharge status: Home / Self Care | Payer: Self-pay | Attending: Emergency Medicine | Admitting: Emergency Medicine

## 2017-02-16 ENCOUNTER — Emergency Department (HOSPITAL_COMMUNITY): Payer: Self-pay

## 2017-02-16 ENCOUNTER — Encounter (HOSPITAL_COMMUNITY): Payer: Self-pay | Admitting: Emergency Medicine

## 2017-02-16 DIAGNOSIS — L03211 Cellulitis of face: Secondary | ICD-10-CM | POA: Insufficient documentation

## 2017-02-16 DIAGNOSIS — F17210 Nicotine dependence, cigarettes, uncomplicated: Secondary | ICD-10-CM | POA: Insufficient documentation

## 2017-02-16 LAB — CBC WITH DIFFERENTIAL/PLATELET
Basophils Absolute: 0 10*3/uL (ref 0.0–0.1)
Basophils Relative: 0 %
Eosinophils Absolute: 0.1 10*3/uL (ref 0.0–0.7)
Eosinophils Relative: 1 %
HCT: 26.9 % — ABNORMAL LOW (ref 36.0–46.0)
Hemoglobin: 8.3 g/dL — ABNORMAL LOW (ref 12.0–15.0)
Lymphocytes Relative: 28 %
Lymphs Abs: 2.6 10*3/uL (ref 0.7–4.0)
MCH: 23.6 pg — ABNORMAL LOW (ref 26.0–34.0)
MCHC: 30.9 g/dL (ref 30.0–36.0)
MCV: 76.4 fL — ABNORMAL LOW (ref 78.0–100.0)
Monocytes Absolute: 0.6 10*3/uL (ref 0.1–1.0)
Monocytes Relative: 7 %
Neutro Abs: 6.1 10*3/uL (ref 1.7–7.7)
Neutrophils Relative %: 64 %
Platelets: 163 10*3/uL (ref 150–400)
RBC: 3.52 MIL/uL — ABNORMAL LOW (ref 3.87–5.11)
RDW: 19.6 % — ABNORMAL HIGH (ref 11.5–15.5)
WBC: 9.5 10*3/uL (ref 4.0–10.5)

## 2017-02-16 LAB — RAPID HIV SCREEN (HIV 1/2 AB+AG)
HIV 1/2 Antibodies: NONREACTIVE
HIV-1 P24 Antigen - HIV24: NONREACTIVE

## 2017-02-16 LAB — I-STAT CG4 LACTIC ACID, ED: Lactic Acid, Venous: 0.92 mmol/L (ref 0.5–1.9)

## 2017-02-16 LAB — BASIC METABOLIC PANEL
Anion gap: 8 (ref 5–15)
BUN: 7 mg/dL (ref 6–20)
CO2: 24 mmol/L (ref 22–32)
Calcium: 8.7 mg/dL — ABNORMAL LOW (ref 8.9–10.3)
Chloride: 103 mmol/L (ref 101–111)
Creatinine, Ser: 0.8 mg/dL (ref 0.44–1.00)
GFR calc Af Amer: >60 mL/min (ref >60)
GFR calc non Af Amer: >60 mL/min (ref >60)
Glucose, Bld: 81 mg/dL (ref 65–99)
Potassium: 4 mmol/L (ref 3.5–5.1)
Sodium: 135 mmol/L (ref 135–145)

## 2017-02-16 MED ORDER — MORPHINE SULFATE (PF) 4 MG/ML IV SOLN
4.0000 mg | Freq: Once | INTRAVENOUS | Status: AC
  Administered 2017-02-16: 4 mg via INTRAVENOUS
  Filled 2017-02-16: qty 1

## 2017-02-16 MED ORDER — HYDROCODONE-ACETAMINOPHEN 5-325 MG PO TABS: 1.0000 | ORAL_TABLET | Freq: Four times a day (QID) | ORAL | 0 refills | Status: DC | PRN

## 2017-02-16 MED ORDER — PIPERACILLIN-TAZOBACTAM 3.375 G IVPB 30 MIN
3.3750 g | Freq: Once | INTRAVENOUS | Status: AC
  Administered 2017-02-16: 3.375 g via INTRAVENOUS
  Filled 2017-02-16: qty 50

## 2017-02-16 MED ORDER — DEXAMETHASONE SODIUM PHOSPHATE 10 MG/ML IJ SOLN
4.0000 mg | Freq: Once | INTRAMUSCULAR | Status: AC
  Administered 2017-02-16: 4 mg via INTRAVENOUS
  Filled 2017-02-16: qty 1

## 2017-02-16 MED ORDER — AMOXICILLIN-POT CLAVULANATE 875-125 MG PO TABS: 1.0000 | ORAL_TABLET | Freq: Two times a day (BID) | ORAL | 0 refills | Status: DC

## 2017-02-16 MED ORDER — IOPAMIDOL (ISOVUE-300) INJECTION 61%
INTRAVENOUS | Status: AC
  Filled 2017-02-16: qty 75

## 2017-02-16 NOTE — Discharge Instructions (Signed)
It was my pleasure taking care of you today!   Please take all of your antibiotics until finished!  Follow up with dentistry. Please see attached guide if you do not have a dentist.   Return to ER immediately for fever, difficulty breathing, worsening symptoms, any additional concerns.

## 2017-02-16 NOTE — ED Triage Notes (Signed)
Pt st's she was seen here 2 days ago with a dental abscess and was put on antibiotics for same.  Pt st's right side of face is more swollen and now pain is radiating into right ear and neck

## 2017-02-16 NOTE — ED Provider Notes (Signed)
[facility] DEPT Provider Note   CSN: [id number] Arrival date & time: 02/16/17  1517     History   Chief Complaint Chief Complaint  Patient presents with  . Abscess    HPI Caitlin Maddox is a 36 y.o. female.  The history is provided by medical records and the patient. No language interpreter was used.   Caitlin Maddox is a 36 y.o. female with no pertinent PMH who presents to the Emergency Department complaining of facial swelling and dental pain x 3 days. She was seen in the emergency department where CT was obtained showing odontogenic cellulitis of the right lower face with no abscess or drainable fluid collection. She was started on clindamycin which she has been taking 3 times daily as directed since ER discharge. She states that this morning she awoke and felt like the swelling had worsened. She described the sensation as a "golfball in my throat". She had been taking pain medication with adequate relief but has now run out. No other medication prior to arrival for symptoms. He shouldn't denies fever, chills, difficulty swallowing, cough, shortness of breath.   History reviewed. No pertinent past medical history.  There are no active problems to display for this patient.   Past Surgical History:  Procedure Laterality Date  . CESAREAN SECTION      OB History    No data available       Home Medications    Prior to Admission medications   Medication Sig Start Date End Date Taking? Authorizing Provider  cetirizine (ZYRTEC) 10 MG tablet Take 10 mg by mouth daily as needed for allergies.   Yes [provider]  clindamycin (CLEOCIN) 150 MG capsule Take 3 capsules (450 mg total) by mouth every 6 (six) hours. 02/14/17 02/21/17 Yes Providence Lanius A, PA-C  amoxicillin-clavulanate (AUGMENTIN) 875-125 MG tablet Take 1 tablet by mouth every 12 (twelve) hours. 02/16/17   Ward, Ozella Almond, PA-C  diclofenac sodium (VOLTAREN) 1 % GEL Apply 2 g topically 4 (four) times  daily. Patient not taking: Reported on 02/16/2017 10/28/14   Antonietta Breach, PA-C  HYDROcodone-acetaminophen (NORCO) 5-325 MG tablet Take 1 tablet by mouth every 6 (six) hours as needed for moderate pain. 02/16/17   Ward, Ozella Almond, PA-C  naproxen (NAPROSYN) 500 MG tablet Take 1 tablet (500 mg total) by mouth 2 (two) times daily. Patient not taking: Reported on 99/07/5699 7/79/39   Delora Fuel, MD  penicillin v potassium (VEETID) 500 MG tablet Take 1 tablet (500 mg total) by mouth 4 (four) times daily. Patient not taking: Reported on 02/16/2017 10/02/15   Montine Circle, PA-C  traMADol (ULTRAM) 50 MG tablet Take 1 tablet (50 mg total) by mouth every 6 (six) hours as needed. Patient not taking: Reported on 03/0/0923 3/00/76   Delora Fuel, MD    Family History No family history on file.  Social History Social History  Substance Use Topics  . Smoking status: Current Every Day Smoker    Packs/day: 0.50    Types: Cigarettes  . Smokeless tobacco: Never Used  . Alcohol use No     Allergies   Patient has no known allergies.   Review of Systems Review of Systems  HENT: Positive for dental problem and facial swelling.   Skin: Positive for color change (redness to face).  All other systems reviewed and are negative.    Physical Exam Updated Vital Signs BP (!) 133/95   Pulse 69   Temp 98.2 F (36.8 C) (Oral)  Resp 15   Ht 6\' 1"  (1.854 m)   Wt 97.5 kg (215 lb)   LMP 02/14/2017   SpO2 100%   BMI 28.37 kg/m   Physical Exam  Constitutional: She is oriented to person, place, and time. She appears well-developed and well-nourished. No distress.  HENT:  Head: Normocephalic.  + facial swelling and erythema to right check. Poor oral dentition. Pain along lower right gum line. Tenderness to right sublingual palate. Airway patent. Midline uvula. No trismus. OP moist and clear.  Neck: Neck supple.  No tenderness.  Cardiovascular: Normal rate, regular rhythm and normal heart sounds.    No murmur heard. Pulmonary/Chest: Effort normal and breath sounds normal. No respiratory distress.  Abdominal: Soft. She exhibits no distension. There is no tenderness.  Neurological: She is alert and oriented to person, place, and time.  Skin: Skin is warm and dry.  Nursing note and vitals reviewed.    ED Treatments / Results  Labs (all labs ordered are listed, but only abnormal results are displayed) Labs Reviewed  CBC WITH DIFFERENTIAL/PLATELET - Abnormal; Notable for the following:       Result Value   RBC 3.52 (*)    Hemoglobin 8.3 (*)    HCT 26.9 (*)    MCV 76.4 (*)    MCH 23.6 (*)    RDW 19.6 (*)    All other components within normal limits  BASIC METABOLIC PANEL - Abnormal; Notable for the following:    Calcium 8.7 (*)    All other components within normal limits  RAPID HIV SCREEN (HIV 1/2 AB+AG)  I-STAT CG4 LACTIC ACID, ED    EKG  EKG Interpretation None       Radiology Ct Soft Tissue Neck W Contrast  Result Date: 02/16/2017 CLINICAL DATA:  Lower facial pain and swelling on the right despite antibiotics. EXAM: CT NECK WITH CONTRAST TECHNIQUE: Multidetector CT imaging of the neck was performed using the standard protocol following the bolus administration of intravenous contrast. CONTRAST:  75 cc Isovue 300 intravenous COMPARISON:  Face CT 02/14/2017 FINDINGS: Pharynx and larynx: Negative for mass or swelling. Lingual tonsil thickening. Salivary glands: Negative for inflammation or mass. Thyroid: Normal Lymph nodes: Mild symmetric nodal prominence which appears reactive Vascular: Major vessels are patent. Limited intracranial: Negative Visualized orbits: Negative Mastoids and visualized paranasal sinuses: Clear Skeleton: The teeth are diffusely devitalized with large erosions and diffuse periapical lucency. No acute or aggressive finding. Upper chest: Negative Other: Subcutaneous fat stranding in the right face has migrated somewhat inferiorly compared to prior. No  abscess or progressive swelling. No floor of mouth inflammation or soft tissue gas. IMPRESSION: Re- demonstrated right facial cellulitis without progression or abscess. No floor of mouth or other deep space involvement. Electronically Signed   By: Monte Fantasia M.D.   On: 02/16/2017 20:52   Ct Maxillofacial W Contrast  Result Date: 02/14/2017 CLINICAL DATA:  Right infraorbital facial swelling EXAM: CT MAXILLOFACIAL WITH CONTRAST TECHNIQUE: Multidetector CT imaging of the maxillofacial structures was performed with intravenous contrast. Multiplanar CT image reconstructions were also generated. CONTRAST:  21mL ISOVUE-300 IOPAMIDOL (ISOVUE-300) INJECTION 61% COMPARISON:  None. FINDINGS: Osseous: --Complex facial fracture types: No LeFort, zygomaticomaxillary complex or nasoorbitoethmoidal fracture. --Simple fracture types: None. --Mandible, hard palate and teeth: There is a periapical lucency at the root of tooth 4, which also as a large carie at its surface. There are numerous dental caries throughout the mouth. The cortex at the root of tooth 4 is discontinuous. There are  similar findings to a lesser degree at the root of tooth 5. Orbits: The globes appear intact. Normal appearance of the intra- and extraconal fat. Symmetric extraocular muscles. Sinuses: No fluid levels or advanced mucosal thickening. Soft tissues: There is inflammatory fat infiltration of the right facial soft tissues, directly overlying the periapical lucency described above. No abscess or drainable fluid collection. Limited intracranial: Normal. IMPRESSION: 1. Odontogenic cellulitis of the lower right face secondary to periapical lucency of tooth 4 with anterior osseous dehiscence. 2. No abscess or drainable fluid collection. 3. Extensive dental disease with numerous caries and periapical lucencies. Electronically Signed   By: Ulyses Jarred M.D.   On: 02/14/2017 23:38    Procedures Procedures (including critical care time)  Medications  Ordered in ED Medications  iopamidol (ISOVUE-300) 61 % injection (not administered)  morphine 4 MG/ML injection 4 mg (4 mg Intravenous Given 02/16/17 1936)  piperacillin-tazobactam (ZOSYN) IVPB 3.375 g (0 g Intravenous Stopped 02/16/17 2012)  dexamethasone (DECADRON) injection 4 mg (4 mg Intravenous Given 02/16/17 2130)     Initial Impression / Assessment and Plan / ED Course  I have reviewed the triage vital signs and the nursing notes.  Pertinent labs & imaging results that were available during my care of the patient were reviewed by me and considered in my medical decision making (see chart for details).    Caitlin Maddox is a 36 y.o. female who presents to ED for persistent facial swelling. Patient was seen in ED on 10/07 and started on clindamycin for dental infection causing facial cellulitis. Despite medication compliance, she awoke this morning feeling more swollen. Given subjective worsening along with sublingual tenderness on exam, CT soft tissue was obtained to assess for ludwig's / deep space infection. CT shows right facial cellulitis without progression or abscess. No deep space infection. Airway is patent and she is managing secretions well. Tolerating PO without difficulty. Dose of IV zosyn given in ED and will switch ABX to Augmentin. Stressed importance of dentistry follow up. Discussed return precautions at length including development of fever, oral swelling, difficulty breathing/swallowing, worsening symptoms or any additional concerns. All questions answered.   Patient discussed with Dr. Darl Householder who agrees with treatment plan.    Final Clinical Impressions(s) / ED Diagnoses   Final diagnoses:  Facial cellulitis    New Prescriptions New Prescriptions   AMOXICILLIN-CLAVULANATE (AUGMENTIN) 875-125 MG TABLET    Take 1 tablet by mouth every 12 (twelve) hours.   HYDROCODONE-ACETAMINOPHEN (NORCO) 5-325 MG TABLET    Take 1 tablet by mouth every 6 (six) hours as needed for moderate  pain.     Ward, Ozella Almond, PA-C 02/16/17 2229    Drenda Freeze, MD 02/16/17 [phone]

## 2017-11-24 ENCOUNTER — Encounter (HOSPITAL_COMMUNITY): Payer: Self-pay

## 2017-11-24 ENCOUNTER — Emergency Department (HOSPITAL_COMMUNITY)
Admission: EM | Admit: 2017-11-24 | Discharge: 2017-11-25 | Disposition: A | Discharge status: Home / Self Care | Payer: Self-pay | Attending: Emergency Medicine | Admitting: Emergency Medicine

## 2017-11-24 ENCOUNTER — Emergency Department (HOSPITAL_COMMUNITY): Payer: Self-pay

## 2017-11-24 DIAGNOSIS — Y999 Unspecified external cause status: Secondary | ICD-10-CM | POA: Insufficient documentation

## 2017-11-24 DIAGNOSIS — S9031XA Contusion of right foot, initial encounter: Secondary | ICD-10-CM | POA: Insufficient documentation

## 2017-11-24 DIAGNOSIS — W2203XA Walked into furniture, initial encounter: Secondary | ICD-10-CM | POA: Insufficient documentation

## 2017-11-24 DIAGNOSIS — Y92009 Unspecified place in unspecified non-institutional (private) residence as the place of occurrence of the external cause: Secondary | ICD-10-CM | POA: Insufficient documentation

## 2017-11-24 DIAGNOSIS — Z79899 Other long term (current) drug therapy: Secondary | ICD-10-CM | POA: Insufficient documentation

## 2017-11-24 DIAGNOSIS — F17210 Nicotine dependence, cigarettes, uncomplicated: Secondary | ICD-10-CM | POA: Insufficient documentation

## 2017-11-24 DIAGNOSIS — Y939 Activity, unspecified: Secondary | ICD-10-CM | POA: Insufficient documentation

## 2017-11-24 MED ORDER — KETOROLAC TROMETHAMINE 30 MG/ML IJ SOLN
30.0000 mg | Freq: Once | INTRAMUSCULAR | Status: AC
  Administered 2017-11-24: 30 mg via INTRAMUSCULAR
  Filled 2017-11-24: qty 1

## 2017-11-24 NOTE — ED Triage Notes (Signed)
Pt reports that she has had R foot pain for the past two days, worse with pressure, pain is on top near big toe, denies injury, denies hx of GOUT

## 2017-11-24 NOTE — ED Provider Notes (Signed)
[facility] EMERGENCY DEPARTMENT Provider Note   CSN: [id number] Arrival date & time: 11/24/17  1949     History   Chief Complaint Chief Complaint  Patient presents with  . Foot Pain    HPI Caitlin Maddox is a 37 y.o. female who presents to ED for evaluation of 2-day history of right great toe MTP pain.  States that symptoms began after she bumped her foot on something at home.  She is having pain with movement and walking on the foot.  No prior history of similar symptoms.  No improvement with ibuprofen and Tylenol.  No history of gout.  Known history of infected joint.  Denies any drainage from area, fever, prior fracture, dislocations or procedures in the area.  HPI  History reviewed. No pertinent past medical history.  There are no active problems to display for this patient.   Past Surgical History:  Procedure Laterality Date  . CESAREAN SECTION       OB History   None      Home Medications    Prior to Admission medications   Medication Sig Start Date End Date Taking? Authorizing Provider  amoxicillin-clavulanate (AUGMENTIN) 875-125 MG tablet Take 1 tablet by mouth every 12 (twelve) hours. 02/16/17   Ward, Ozella Almond, PA-C  cetirizine (ZYRTEC) 10 MG tablet Take 10 mg by mouth daily as needed for allergies.    [provider]  diclofenac sodium (VOLTAREN) 1 % GEL Apply 2 g topically 4 (four) times daily. Patient not taking: Reported on 02/16/2017 10/28/14   Antonietta Breach, PA-C  HYDROcodone-acetaminophen (NORCO) 5-325 MG tablet Take 1 tablet by mouth every 6 (six) hours as needed for moderate pain. 02/16/17   Ward, Ozella Almond, PA-C  naproxen (NAPROSYN) 500 MG tablet Take 1 tablet (500 mg total) by mouth 2 (two) times daily. 11/25/17   Jaxsun Ciampi, PA-C  penicillin v potassium (VEETID) 500 MG tablet Take 1 tablet (500 mg total) by mouth 4 (four) times daily. Patient not taking: Reported on 02/16/2017 10/02/15   Montine Circle, PA-C    traMADol (ULTRAM) 50 MG tablet Take 1 tablet (50 mg total) by mouth every 6 (six) hours as needed. 11/25/17   Delia Heady, PA-C    Family History No family history on file.  Social History Social History   Tobacco Use  . Smoking status: Current Every Day Smoker    Packs/day: 0.50    Types: Cigarettes  . Smokeless tobacco: Never Used  Substance Use Topics  . Alcohol use: No  . Drug use: No     Allergies   Patient has no known allergies.   Review of Systems Review of Systems  Constitutional: Negative for chills and fever.  Musculoskeletal: Positive for arthralgias and gait problem. Negative for joint swelling and myalgias.  Skin: Negative for wound.  Neurological: Negative for weakness and numbness.     Physical Exam Updated Vital Signs BP 107/82 (BP Location: Right Arm)   Pulse 81   Temp 98 F (36.7 C) (Oral)   Resp 20   LMP 11/22/2017   SpO2 99%   Physical Exam  Constitutional: She appears well-developed and well-nourished. No distress.  HENT:  Head: Normocephalic and atraumatic.  Eyes: Conjunctivae and EOM are normal. No scleral icterus.  Neck: Normal range of motion.  Pulmonary/Chest: Effort normal. No respiratory distress.  Musculoskeletal: She exhibits tenderness. She exhibits no edema or deformity.  Skin abrasion noted to right great toe MTP joint area.  No warmth or  erythema of joint noted.  Full passive range of motion of the joint without difficulty.  2+ DP pulse noted.  Full active range of motion of major of digits noted.  Able to perform strength against resistance of right great toe.  Neurological: She is alert.  Skin: No rash noted. She is not diaphoretic.  Psychiatric: She has a normal mood and affect.  Nursing note and vitals reviewed.    ED Treatments / Results  Labs (all labs ordered are listed, but only abnormal results are displayed) Labs Reviewed - No data to display  EKG None  Radiology Dg Foot Complete Right  Result Date:  11/24/2017 CLINICAL DATA:  Injury of the medial right foot 2 days ago, unable to bear weight. EXAM: RIGHT FOOT COMPLETE - 3+ VIEW COMPARISON:  01/04/2015 FINDINGS: Fragmented spurring of the medial head of the first metatarsal, not appreciably changed from 01/04/2015. Small adjacent bony bunion. No fracture is identified. Lisfranc joint alignment normal. Small os peroneus. IMPRESSION: 1. Small bony bunion with chronic bony spur like fragmentation medially along the first metatarsal head. No acute findings. If pain persists despite conservative therapy, MRI may be warranted for further characterization. Electronically Signed   By: Van Clines M.D.   On: 11/24/2017 21:28    Procedures Procedures (including critical care time)  Medications Ordered in ED Medications  ketorolac (TORADOL) 30 MG/ML injection 30 mg (30 mg Intramuscular Given 11/24/17 2343)     Initial Impression / Assessment and Plan / ED Course  I have reviewed the triage vital signs and the nursing notes.  Pertinent labs & imaging results that were available during my care of the patient were reviewed by me and considered in my medical decision making (see chart for details).     37 year old female presents to ED for evaluation of 2-day history of right great toe MTP pain.  Symptoms began after she bumped her foot on something at home.  No improvement with ibuprofen and Tylenol.  On physical exam there is a skin abrasion noted to the MTP joint area of the right great toe.  Reports pain with range of motion although able to perform.  Area is neurovascularly intact.  X-ray shows small bony bunion with no acute findings.  Suggested additional imaging if conservative measures do not improve the symptoms.  Doubt infectious or vascular cause of symptoms.  Will give anti-inflammatories, short course of pain medication, Ace wrap and follow-up with foot specialist for further evaluation if symptoms persist.  Advised to return to ED for any  severe or worsening symptoms. [place] PMP reviewed with no discrepancies.  Portions of this note were generated with [employer]. Dictation errors may occur despite best attempts at proofreading.  Final Clinical Impressions(s) / ED Diagnoses   Final diagnoses:  Contusion of right foot, initial encounter    ED Discharge Orders        Ordered    naproxen (NAPROSYN) 500 MG tablet  2 times daily     11/25/17 0006    traMADol (ULTRAM) 50 MG tablet  Every 6 hours PRN     11/25/17 0006       Delia Heady, PA-C 11/25/17 0006    Fredia Sorrow, MD 11/25/17 0105

## 2017-11-25 MED ORDER — NAPROXEN 500 MG PO TABS: 500.0000 mg | ORAL_TABLET | Freq: Two times a day (BID) | ORAL | 0 refills | Status: DC

## 2017-11-25 MED ORDER — TRAMADOL HCL 50 MG PO TABS: 50.0000 mg | ORAL_TABLET | Freq: Four times a day (QID) | ORAL | 0 refills | Status: DC | PRN

## 2017-11-25 NOTE — Discharge Instructions (Addendum)
Return to ED for worsening symptoms including increased redness or warmth of your joint, fever, additional injuries, numbness in your leg.

## 2018-06-11 ENCOUNTER — Other Ambulatory Visit: Payer: Self-pay

## 2018-06-11 ENCOUNTER — Emergency Department (HOSPITAL_COMMUNITY)
Admission: EM | Admit: 2018-06-11 | Discharge: 2018-06-11 | Disposition: A | Discharge status: Home / Self Care | Payer: Self-pay | Attending: Emergency Medicine | Admitting: Emergency Medicine

## 2018-06-11 ENCOUNTER — Encounter (HOSPITAL_COMMUNITY): Payer: Self-pay | Admitting: *Deleted

## 2018-06-11 DIAGNOSIS — M79601 Pain in right arm: Secondary | ICD-10-CM | POA: Insufficient documentation

## 2018-06-11 DIAGNOSIS — Z79899 Other long term (current) drug therapy: Secondary | ICD-10-CM | POA: Insufficient documentation

## 2018-06-11 DIAGNOSIS — F17210 Nicotine dependence, cigarettes, uncomplicated: Secondary | ICD-10-CM | POA: Insufficient documentation

## 2018-06-11 MED ORDER — NAPROXEN 500 MG PO TABS: 500.0000 mg | ORAL_TABLET | Freq: Two times a day (BID) | ORAL | 0 refills | Status: DC

## 2018-06-11 NOTE — ED Provider Notes (Signed)
[facility] EMERGENCY DEPARTMENT Provider Note   CSN: [id number] Arrival date & time: 06/11/18  9563     History   Chief Complaint Chief Complaint  Patient presents with  . Arm Pain    HPI Caitlin Maddox is a 38 y.o. female.  HPI   Patient is a 38 year old female with no significant past medical history presenting for right arm pain.  Reports that the pain is primarily in her anterior right shoulder as well as her posterior right elbow.  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  She does not recall any particular overuse injury, however does report that she does a lot of lifting daily.  She denies any erythema or edema of the arm.  Denies fever or chills.  She denies any numbness.  She does report that she has pain radiating down the arm to the fourth and fifth digits of the right hand.  She reports that she had to leave work due to her symptoms.  She did take an Aleve this morning prior to leaving work.  History reviewed. No pertinent past medical history.  There are no active problems to display for this patient.   Past Surgical History:  Procedure Laterality Date  . CESAREAN SECTION       OB History   No obstetric history on file.      Home Medications    Prior to Admission medications   Medication Sig Start Date End Date Taking? Authorizing Provider  amoxicillin-clavulanate (AUGMENTIN) 875-125 MG tablet Take 1 tablet by mouth every 12 (twelve) hours. 02/16/17   Ward, Ozella Almond, PA-C  cetirizine (ZYRTEC) 10 MG tablet Take 10 mg by mouth daily as needed for allergies.    [provider]  diclofenac sodium (VOLTAREN) 1 % GEL Apply 2 g topically 4 (four) times daily. Patient not taking: Reported on 02/16/2017 10/28/14   Antonietta Breach, PA-C  HYDROcodone-acetaminophen (NORCO) 5-325 MG tablet Take 1 tablet by mouth every 6 (six) hours as needed for moderate pain. 02/16/17   Ward, Ozella Almond, PA-C  naproxen (NAPROSYN)  500 MG tablet Take 1 tablet (500 mg total) by mouth 2 (two) times daily. 11/25/17   Khatri, Hina, PA-C  penicillin v potassium (VEETID) 500 MG tablet Take 1 tablet (500 mg total) by mouth 4 (four) times daily. Patient not taking: Reported on 02/16/2017 10/02/15   Montine Circle, PA-C  traMADol (ULTRAM) 50 MG tablet Take 1 tablet (50 mg total) by mouth every 6 (six) hours as needed. 11/25/17   Delia Heady, PA-C    Family History History reviewed. No pertinent family history.  Social History Social History   Tobacco Use  . Smoking status: Current Every Day Smoker    Packs/day: 0.50    Types: Cigarettes  . Smokeless tobacco: Never Used  Substance Use Topics  . Alcohol use: No  . Drug use: No     Allergies   Clindamycin/lincomycin   Review of Systems Review of Systems  Musculoskeletal: Positive for arthralgias and myalgias. Negative for back pain and joint swelling.  Skin: Negative for color change and wound.  Neurological: Negative for weakness and numbness.     Physical Exam Updated Vital Signs BP (!) 144/93 (BP Location: Right Arm)   Pulse 72   Temp 98.4 F (36.9 C) (Oral)   Resp 20   Ht 6' (1.829 m)   Wt 95.3 kg   LMP 06/11/2018   SpO2 99%   BMI  28.48 kg/m   Physical Exam Vitals signs and nursing note reviewed.  Constitutional:      General: She is not in acute distress.    Appearance: She is well-developed. She is not diaphoretic.     Comments: Sitting comfortably in bed.  HENT:     Head: Normocephalic and atraumatic.  Eyes:     General:        Right eye: No discharge.        Left eye: No discharge.     Conjunctiva/sclera: Conjunctivae normal.     Comments: EOMs normal to gross examination.  Neck:     Musculoskeletal: Normal range of motion.  Cardiovascular:     Rate and Rhythm: Normal rate and regular rhythm.     Comments: Intact, 2+ radial pulse. Pulmonary:     Comments: Patient converses comfortably without audible wheeze or stridor. Abdominal:      General: There is no distension.  Musculoskeletal:     Comments: Right shoulder with tenderness to palpation over biceps tendon insertion sites and AC joint. Decreased ROM throughout due to pain. Positive empty can test, positive Neer's. No swelling, erythema or ecchymosis present. No step-off, crepitus, or deformity appreciated. 5/5 muscle strength of RUE. 2+ radial pulse, sensation intact and all compartments soft. Positive Tinel's sign of right cubital tunnel.    Skin:    General: Skin is warm and dry.  Neurological:     Mental Status: She is alert.     Comments: Cranial nerves intact to gross observation. Patient moves extremities without difficulty.  Psychiatric:        Behavior: Behavior normal.        Thought Content: Thought content normal.        Judgment: Judgment normal.      ED Treatments / Results  Labs (all labs ordered are listed, but only abnormal results are displayed) Labs Reviewed - No data to display  EKG None  Radiology No results found.  Procedures Procedures (including critical care time)  Medications Ordered in ED Medications - No data to display   Initial Impression / Assessment and Plan / ED Course  I have reviewed the triage vital signs and the nursing notes.  Pertinent labs & imaging results that were available during my care of the patient were reviewed by me and considered in my medical decision making (see chart for details).     Patient is well-appearing and neurovascularly intact in the right upper extremity.  Differential diagnosis includes AC joint arthropathy, biceps tendinopathy, rotator cuff tendinopathy, cubital tunnel syndrome, cervical radiculopathy.  No specific inciting injury, but symptoms are suggestive of overuse.  Patient has no red flag signs, midline neck tenderness, fevers, chills, or evidence of swelling of the arm.  Suspect that this is overuse injury.  Will treat with anti-inflammatories, rest, range of motion, and  ice.  Patient struck to follow-up with her employee health clinic at work.  Return precautions given for any increasing pain, erythema or edema of the right arm.  Patient is in understanding and agrees with plan of care.  Final Clinical Impressions(s) / ED Diagnoses   Final diagnoses:  Right arm pain    ED Discharge Orders         Ordered    naproxen (NAPROSYN) 500 MG tablet  2 times daily     06/11/18 0941           Albesa Seen, PA-C 06/11/18 0944    Daleen Bo, MD 06/11/18 1900

## 2018-06-11 NOTE — ED Triage Notes (Signed)
PT reports RT arm pain started during the night. Pt did go to work but the arm pain worse at work. Pt is a CNA and gives direct Pt care.

## 2018-06-11 NOTE — Discharge Instructions (Signed)
It was my pleasure taking care of you today!  Naproxen every 12 hours for at least five consecutive days.   You are prescribed naproxen, a non-steroidal anti-inflammatory agent (NSAID) for pain. You may take 500 mg every 12 hours as needed for pain. If still requiring this medication around the clock for acute pain after 10 days, please see your primary healthcare provider.  Women who are pregnant, breastfeeding, or planning on becoming pregnant should not take non-steroidal anti-inflammatories such as Advil and Aleve. Tylenol is a safe over the counter pain reliever in pregnant women.  You may combine this medication with Tylenol, 650 mg every 6 hours, so you are receiving something for pain every 3 hours.  This is not a long-term medication unless under the care and direction of your primary provider. Taking this medication long-term and not under the supervision of a healthcare provider could increase the risk of stomach ulcers, kidney problems, and cardiovascular problems such as high blood pressure.   Wear shoulder sling for no more than 3 days, then begin performing gentle range of motion exercises.   Call the orthopedist listed today or tomorrow to schedule a follow up appointment for recheck of ongoing shoulder pain in 1-2 weeks that can be canceled with a 24-48 hour notice if complete resolution of pain.   Call the orthopedist listed if symptoms are not improved in one week.   Return to the ER for new or worsening symptoms, any additional concerns.  COLD THERAPY DIRECTIONS:  Ice or gel packs can be used to reduce both pain and swelling. Ice is the most helpful within the first 24 to 48 hours after an injury or flareup from overusing a muscle or joint.  Ice is effective, has very few side effects, and is safe for most people to use.   If you expose your skin to cold temperatures for too long or without the proper protection, you can damage your skin or nerves. Watch for signs of skin  damage due to cold.   HOME CARE INSTRUCTIONS  Follow these tips to use ice and cold packs safely.  Place a dry or damp towel between the ice and skin. A damp towel will cool the skin more quickly, so you may need to shorten the time that the ice is used.  For a more rapid response, add gentle compression to the ice.  Ice for no more than 10 to 20 minutes at a time. The bonier the area you are icing, the less time it will take to get the benefits of ice.  Check your skin after 5 minutes to make sure there are no signs of a poor response to cold or skin damage.  Rest 20 minutes or more in between uses.  Once your skin is numb, you can end your treatment. You can test numbness by very lightly touching your skin. The touch should be so light that you do not see the skin dimple from the pressure of your fingertip. When using ice, most people will feel these normal sensations in this order: cold, burning, aching, and numbness.    Please also follow-up with your employee health clinic to discuss any further time off if needed.

## 2018-10-28 ENCOUNTER — Other Ambulatory Visit: Payer: Self-pay

## 2018-10-28 ENCOUNTER — Emergency Department (HOSPITAL_COMMUNITY)
Admission: EM | Admit: 2018-10-28 | Discharge: 2018-10-28 | Disposition: A | Discharge status: Home / Self Care | Payer: Self-pay | Attending: Emergency Medicine | Admitting: Emergency Medicine

## 2018-10-28 ENCOUNTER — Emergency Department (HOSPITAL_COMMUNITY): Payer: Self-pay

## 2018-10-28 ENCOUNTER — Encounter (HOSPITAL_COMMUNITY): Payer: Self-pay | Admitting: Emergency Medicine

## 2018-10-28 DIAGNOSIS — Y9301 Activity, walking, marching and hiking: Secondary | ICD-10-CM | POA: Insufficient documentation

## 2018-10-28 DIAGNOSIS — Y998 Other external cause status: Secondary | ICD-10-CM | POA: Insufficient documentation

## 2018-10-28 DIAGNOSIS — F17210 Nicotine dependence, cigarettes, uncomplicated: Secondary | ICD-10-CM | POA: Insufficient documentation

## 2018-10-28 DIAGNOSIS — S5001XA Contusion of right elbow, initial encounter: Secondary | ICD-10-CM | POA: Insufficient documentation

## 2018-10-28 DIAGNOSIS — Y9289 Other specified places as the place of occurrence of the external cause: Secondary | ICD-10-CM | POA: Insufficient documentation

## 2018-10-28 DIAGNOSIS — W228XXA Striking against or struck by other objects, initial encounter: Secondary | ICD-10-CM | POA: Insufficient documentation

## 2018-10-28 MED ORDER — IBUPROFEN 400 MG PO TABS
600.0000 mg | ORAL_TABLET | Freq: Once | ORAL | Status: AC
  Administered 2018-10-28: 600 mg via ORAL
  Filled 2018-10-28: qty 1

## 2018-10-28 MED ORDER — IBUPROFEN 600 MG PO TABS: 600.0000 mg | ORAL_TABLET | Freq: Four times a day (QID) | ORAL | 0 refills | Status: DC | PRN

## 2018-10-28 NOTE — ED Notes (Signed)
Pt verbalized understanding of discharge and follow up instructions. Ace bandage placed with ice and sling for comfort. Pt verbalized understanding of device. Pt ambulatory to lobby with steady gait.

## 2018-10-28 NOTE — ED Notes (Signed)
Patient transported to X-ray 

## 2018-10-28 NOTE — ED Triage Notes (Signed)
Pt states she fell down her stairs last night. She states her elbow caught her fall.  Pain is 8/10 on right elbow. Pt endorses  taking tylenol with mild relief.

## 2018-10-28 NOTE — Discharge Instructions (Addendum)
If your pain continues after one week of conservative treatment, please follow up with ortho.

## 2018-10-28 NOTE — ED Provider Notes (Signed)
[facility] EMERGENCY DEPARTMENT Provider Note   CSN: [id number] Arrival date & time: 10/28/18  1041    History   Chief Complaint Chief Complaint  Patient presents with  . Arm Injury    HPI Caitlin Maddox is a 38 y.o. female presenting today stating right elbow pain after mechanical fall last night.  Pt states she was walking down concrete stairs that had metal lipping and her shoe slipped on the step.   She states she hit her right elbow against the brick wall.  No other injury reported.   Pt states her pain is moderate, achy/throbbing, non-radiating, constant, worse with flexion/extension.   Denies numbness, shoulder/wrist pain.     HPI  History reviewed. No pertinent past medical history.  There are no active problems to display for this patient.   Past Surgical History:  Procedure Laterality Date  . CESAREAN SECTION       OB History   No obstetric history on file.      Home Medications    Prior to Admission medications   Medication Sig Start Date End Date Taking? Authorizing Provider  amoxicillin-clavulanate (AUGMENTIN) 875-125 MG tablet Take 1 tablet by mouth every 12 (twelve) hours. 02/16/17   Ward, Ozella Almond, PA-C  cetirizine (ZYRTEC) 10 MG tablet Take 10 mg by mouth daily as needed for allergies.    [provider]  HYDROcodone-acetaminophen (NORCO) 5-325 MG tablet Take 1 tablet by mouth every 6 (six) hours as needed for moderate pain. 02/16/17   Ward, Ozella Almond, PA-C  ibuprofen (ADVIL) 600 MG tablet Take 1 tablet (600 mg total) by mouth every 6 (six) hours as needed. 10/28/18   Santasia Rew K, PA-C  naproxen (NAPROSYN) 500 MG tablet Take 1 tablet (500 mg total) by mouth 2 (two) times daily. 06/11/18   Langston Masker B, PA-C  traMADol (ULTRAM) 50 MG tablet Take 1 tablet (50 mg total) by mouth every 6 (six) hours as needed. 11/25/17   Delia Heady, PA-C    Family History No family history on file.  Social History Social History    Tobacco Use  . Smoking status: Current Every Day Smoker    Packs/day: 0.50    Types: Cigarettes  . Smokeless tobacco: Never Used  Substance Use Topics  . Alcohol use: No  . Drug use: No     Allergies   Clindamycin/lincomycin   Review of Systems Review of Systems  Constitutional: Negative for fever.  Musculoskeletal: Negative for back pain, gait problem and neck pain.       Right elbow pain   Skin: Negative for wound.  Neurological: Negative for numbness.     Physical Exam Updated Vital Signs BP 133/86 (BP Location: Left Arm)   Pulse 89   Temp 98.3 F (36.8 C) (Oral)   Resp 18   Ht 6' (1.829 m)   Wt 97.5 kg   SpO2 100%   BMI 29.16 kg/m   Physical Exam Vitals signs and nursing note reviewed.  Constitutional:      General: She is not in acute distress.    Appearance: Normal appearance. She is not diaphoretic.  HENT:     Head: Normocephalic and atraumatic.  Eyes:     General:        Right eye: No discharge.        Left eye: No discharge.  Pulmonary:     Effort: Pulmonary effort is normal. No respiratory distress.  Musculoskeletal:  General: Swelling (very mild swelling to lateral elbow), tenderness and signs of injury present.     Comments: Pt reports ttp to right radial head w/out apparent deformity;  Pain with flexion of elbow;  No pain with supination/pronation/extension;  No pain to shoulder, humerus, forearm or wrist.   Distal PMS intact;  Skin p/w/d.  No wounds noted.    Skin:    General: Skin is warm.     Capillary Refill: Capillary refill takes less than 2 seconds.  Neurological:     Mental Status: She is alert and oriented to person, place, and time.     Coordination: Coordination normal.  Psychiatric:        Mood and Affect: Mood normal.        Behavior: Behavior normal.      ED Treatments / Results  Labs (all labs ordered are listed, but only abnormal results are displayed) Labs Reviewed - No data to display  EKG None   Radiology Dg Elbow Complete Right  Result Date: 10/28/2018 CLINICAL DATA:  Right elbow pain after falling down stairs last night. EXAM: RIGHT ELBOW - COMPLETE 3+ VIEW COMPARISON:  None. FINDINGS: There is no evidence of fracture, dislocation, or joint effusion. There is no evidence of arthropathy or other focal bone abnormality. Soft tissues are unremarkable. IMPRESSION: Normal examination. Electronically Signed   By: Claudie Revering M.D.   On: 10/28/2018 11:51    Procedures Procedures (including critical care time)  Medications Ordered in ED Medications  ibuprofen (ADVIL) tablet 600 mg (600 mg Oral Given 10/28/18 1122)     Initial Impression / Assessment and Plan / ED Course  I have reviewed the triage vital signs and the nursing notes.  Pertinent labs & imaging results that were available during my care of the patient were reviewed by me and considered in my medical decision making (see chart for details).  Pts xray unremarkable for fracture.   I have provided her with ace bandage, sling and ice pack for comfort.  Will send home with NSAIDS and follow up if pain does not resolve after 1 week of conservative treatment.  Pt understands and is agreeable to plan.   Final Clinical Impressions(s) / ED Diagnoses   Final diagnoses:  Contusion of right elbow, initial encounter    ED Discharge Orders         Ordered    ibuprofen (ADVIL) 600 MG tablet  Every 6 hours PRN     10/28/18 1202           Jazariah Teall K, PA-C 10/28/18 1202    Drenda Freeze, MD 10/28/18 [phone]

## 2019-05-31 ENCOUNTER — Ambulatory Visit
Admission: EM | Admit: 2019-05-31 | Discharge: 2019-05-31 | Disposition: A | Discharge status: Home / Self Care | Payer: Self-pay | Attending: Emergency Medicine | Admitting: Emergency Medicine

## 2019-05-31 DIAGNOSIS — Z8719 Personal history of other diseases of the digestive system: Secondary | ICD-10-CM

## 2019-05-31 DIAGNOSIS — K0889 Other specified disorders of teeth and supporting structures: Secondary | ICD-10-CM

## 2019-05-31 DIAGNOSIS — R220 Localized swelling, mass and lump, head: Secondary | ICD-10-CM

## 2019-05-31 DIAGNOSIS — F17210 Nicotine dependence, cigarettes, uncomplicated: Secondary | ICD-10-CM

## 2019-05-31 DIAGNOSIS — K029 Dental caries, unspecified: Secondary | ICD-10-CM

## 2019-05-31 MED ORDER — IBUPROFEN 600 MG PO TABS: 600.0000 mg | ORAL_TABLET | Freq: Four times a day (QID) | ORAL | 0 refills | Status: DC | PRN

## 2019-05-31 MED ORDER — AMOXICILLIN-POT CLAVULANATE 875-125 MG PO TABS: 1.0000 | ORAL_TABLET | Freq: Two times a day (BID) | ORAL | 0 refills | Status: DC

## 2019-05-31 NOTE — ED Provider Notes (Signed)
[name]    CSN: [id number] Arrival date & time: 05/31/19  [id number]      History   Chief Complaint Chief Complaint  Patient presents with  . Facial Swelling    HPI Caitlin Maddox is a 39 y.o. female with history of dental decay, caries, dental abscess presenting for right cheek swelling since yesterday.  Patient did note pain in affected area since Monday.  Patient denies tooth pain, pain with chewing, fever, change in vision or hearing, sinus pressure, nasal congestion or rhinorrhea.  Has taken Tylenol with some relief.  Does not currently have a dentist.   History reviewed. No pertinent past medical history.  There are no problems to display for this patient.   Past Surgical History:  Procedure Laterality Date  . CESAREAN SECTION      OB History   No obstetric history on file.      Home Medications    Prior to Admission medications   Medication Sig Start Date End Date Taking? Authorizing Provider  amoxicillin-clavulanate (AUGMENTIN) 875-125 MG tablet Take 1 tablet by mouth every 12 (twelve) hours. 05/31/19   Hall-Potvin, [place], PA-C  ibuprofen (ADVIL) 600 MG tablet Take 1 tablet (600 mg total) by mouth every 6 (six) hours as needed. 05/31/19   Hall-Potvin, [place], PA-C    Family History History reviewed. No pertinent family history.  Social History Social History   Tobacco Use  . Smoking status: Current Every Day Smoker    Packs/day: 0.50    Types: Cigarettes  . Smokeless tobacco: Never Used  Substance Use Topics  . Alcohol use: No  . Drug use: No     Allergies   Clindamycin/lincomycin   Review of Systems As per HPI   Physical Exam Triage Vital Signs ED Triage Vitals  Enc Vitals Group     BP      Pulse      Resp      Temp      Temp src      SpO2      Weight      Height      Head Circumference      Peak Flow      Pain Score      Pain Loc      Pain Edu?      Excl. in [place]?    No data found.  Updated Vital Signs BP  (!) 153/92 (BP Location: Left Arm)   Pulse 80   Temp 98.3 F (36.8 C) (Oral)   Resp 16   LMP 05/31/2019   SpO2 100%   Visual Acuity Right Eye Distance:   Left Eye Distance:   Bilateral Distance:    Right Eye Near:   Left Eye Near:    Bilateral Near:     Physical Exam Constitutional:      General: She is not in acute distress.    Appearance: She is not ill-appearing.  HENT:     Head: Normocephalic and atraumatic.     Right Ear: Tympanic membrane, ear canal and external ear normal.     Left Ear: Tympanic membrane, ear canal and external ear normal.     Nose: Nose normal.     Mouth/Throat:     Mouth: Mucous membranes are moist.     Pharynx: Oropharynx is clear.     Comments: Poor dentition with several areas of dental decay into gumline.  Right upper gumline edematous, tender over front molar.  No erythema, discharge, fluctuance appreciated.  Eyes:     General: No scleral icterus.    Conjunctiva/sclera: Conjunctivae normal.     Pupils: Pupils are equal, round, and reactive to light.  Cardiovascular:     Rate and Rhythm: Normal rate.  Pulmonary:     Effort: Pulmonary effort is normal.  Musculoskeletal:     Cervical back: No tenderness.  Lymphadenopathy:     Cervical: No cervical adenopathy.  Skin:    Coloration: Skin is not jaundiced or pale.     Findings: No bruising or rash.     Comments: Right nasolabial fold mildly edematous as compared to left.  Tender to palpation with discrete induration.  No fluctuance, erythema, warmth appreciated.  Neurological:     Mental Status: She is alert and oriented to person, place, and time.      UC Treatments / Results  Labs (all labs ordered are listed, but only abnormal results are displayed) Labs Reviewed - No data to display  EKG   Radiology No results found.  Procedures Procedures (including critical care time)  Medications Ordered in UC Medications - No data to display  Initial Impression / Assessment and Plan  / UC Course  I have reviewed the triage vital signs and the nursing notes.  Pertinent labs & imaging results that were available during my care of the patient were reviewed by me and considered in my medical decision making (see chart for details).     I have reviewed the triage vital signs and the nursing notes.  All pertinent labs & imaging results that were available during my care of the patient were reviewed by me and considered in my medical decision making (see chart for details).  Patient afebrile, nontoxic.  H&P concerning for forming dental abscess.  Will start Augmentin today, encourage hot compresses, pain management as outlined below.  Provided low cost community dental resources and instructed patient follow-up in 1 to 2 weeks.  Return precautions discussed, patient verbalized understanding and is agreeable to plan. Final Clinical Impressions(s) / UC Diagnoses   Final diagnoses:  Right facial swelling  Dental decay  History of dental abscess  Tooth pain     Discharge Instructions     Low-Cost Community Dental Resources:  [place] Department ([facility]) Phone number: [phone]  [place] Department Phone number: [phone]  - [facility] Address: [address], [place], [place], [address] Phone:[phone] Ext.[phone] *Schedule: 2nd and 4thThursday of the month at 6:30a.m.(simple extraction only - no wisdom teeth or surgery) *First come/First serve bases - First 10 clients served  - [facility] [place], and [employer] residents only) Address: [address], [place], [place], [address] Phone:[phone]  - [facility] Address: [address], [place], [place], [address] Phone: [phone]  [facility] Address: [address] [place], [place], [address] Phone: [phone]  - Dr. Donn Pierini Address: [address], [place], [place], [address] Phone: [phone]  [facility] Department Address: [address], [place], [place] [address] Phone: [phone]    ED Prescriptions    Medication Sig Dispense Auth. Provider   amoxicillin-clavulanate (AUGMENTIN) 875-125 MG tablet Take 1 tablet by mouth every 12 (twelve) hours. 14 tablet Hall-Potvin, [place], PA-C   ibuprofen (ADVIL) 600 MG tablet Take 1 tablet (600 mg total) by mouth every 6 (six) hours as needed. 30 tablet Hall-Potvin, [place], PA-C     PDMP not reviewed this encounter.   Hall-Potvin,  [place], PA-C 05/31/19 [phone]

## 2019-05-31 NOTE — ED Triage Notes (Signed)
Pt c/o rt side of face pain since Monday and rt cheek swelling since yesterday. Denies dental pain. States gets COVID tested twice a week, had a negative on Monday.

## 2019-05-31 NOTE — Discharge Instructions (Signed)
Low-Cost Museum/gallery exhibitions officer Resources:  [place] Department ([facility]) Phone number: [phone]  [place] Department Phone number: [phone]  - [facility]* Address: [address], [place], [place], [address] Phone:[phone] Ext.[phone] *Schedule: 2nd and 4thThursday of the month at 6:30a.m.(simple extraction only - no wisdom teeth or surgery) *First come/First serve bases - First 10 clients served  - [facility] [place], [place], and [employer] residents only) Address: [address], [place], [place], [address] Phone:[phone]  - [facility] Address: [address] [place], [place], [place], [address] Phone: [phone]  [facility] Address: [address], [place], [place], [address] Phone: [phone]  - Dr. Donn Pierini Address: [address], [place], [place], [address] Phone: [phone]  [facility] Department Address: [address] [place], [place] [address] Phone: [phone]

## 2020-10-04 ENCOUNTER — Emergency Department (HOSPITAL_COMMUNITY): Payer: Self-pay

## 2020-10-04 ENCOUNTER — Other Ambulatory Visit: Payer: Self-pay

## 2020-10-04 ENCOUNTER — Inpatient Hospital Stay (HOSPITAL_COMMUNITY)
Admission: EM | Admit: 2020-10-04 | Discharge: 2020-10-05 | DRG: 812 | Disposition: A | Discharge status: Home / Self Care | Payer: Self-pay | Attending: Obstetrics and Gynecology | Admitting: Obstetrics and Gynecology

## 2020-10-04 ENCOUNTER — Encounter (HOSPITAL_COMMUNITY): Payer: Self-pay

## 2020-10-04 DIAGNOSIS — D259 Leiomyoma of uterus, unspecified: Secondary | ICD-10-CM | POA: Diagnosis present

## 2020-10-04 DIAGNOSIS — Z881 Allergy status to other antibiotic agents status: Secondary | ICD-10-CM

## 2020-10-04 DIAGNOSIS — A599 Trichomoniasis, unspecified: Secondary | ICD-10-CM

## 2020-10-04 DIAGNOSIS — R102 Pelvic and perineal pain: Secondary | ICD-10-CM

## 2020-10-04 DIAGNOSIS — Z98891 History of uterine scar from previous surgery: Secondary | ICD-10-CM

## 2020-10-04 DIAGNOSIS — D509 Iron deficiency anemia, unspecified: Secondary | ICD-10-CM

## 2020-10-04 DIAGNOSIS — N920 Excessive and frequent menstruation with regular cycle: Secondary | ICD-10-CM | POA: Diagnosis present

## 2020-10-04 DIAGNOSIS — D649 Anemia, unspecified: Secondary | ICD-10-CM | POA: Diagnosis present

## 2020-10-04 DIAGNOSIS — Z20822 Contact with and (suspected) exposure to covid-19: Secondary | ICD-10-CM | POA: Diagnosis present

## 2020-10-04 DIAGNOSIS — N939 Abnormal uterine and vaginal bleeding, unspecified: Secondary | ICD-10-CM

## 2020-10-04 DIAGNOSIS — D500 Iron deficiency anemia secondary to blood loss (chronic): Principal | ICD-10-CM | POA: Diagnosis present

## 2020-10-04 DIAGNOSIS — F17210 Nicotine dependence, cigarettes, uncomplicated: Secondary | ICD-10-CM | POA: Diagnosis present

## 2020-10-04 DIAGNOSIS — Z975 Presence of (intrauterine) contraceptive device: Secondary | ICD-10-CM

## 2020-10-04 LAB — PREPARE RBC (CROSSMATCH)

## 2020-10-04 LAB — WET PREP, GENITAL
Sperm: NONE SEEN
Yeast Wet Prep HPF POC: NONE SEEN

## 2020-10-04 LAB — CBC
HCT: 20.4 % — ABNORMAL LOW (ref 36.0–46.0)
Hemoglobin: 5.2 g/dL — CL (ref 12.0–15.0)
MCH: 17.7 pg — ABNORMAL LOW (ref 26.0–34.0)
MCHC: 25.5 g/dL — ABNORMAL LOW (ref 30.0–36.0)
MCV: 69.6 fL — ABNORMAL LOW (ref 80.0–100.0)
Platelets: 253 10*3/uL (ref 150–400)
RBC: 2.93 MIL/uL — ABNORMAL LOW (ref 3.87–5.11)
RDW: 23 % — ABNORMAL HIGH (ref 11.5–15.5)
WBC: 7.6 10*3/uL (ref 4.0–10.5)
nRBC: 0 % (ref 0.0–0.2)

## 2020-10-04 LAB — I-STAT BETA HCG BLOOD, ED (MC, WL, AP ONLY): I-stat hCG, quantitative: <5 m[IU]/mL (ref <5)

## 2020-10-04 LAB — BASIC METABOLIC PANEL
Anion gap: 6 (ref 5–15)
BUN: 6 mg/dL (ref 6–20)
CO2: 25 mmol/L (ref 22–32)
Calcium: 9.4 mg/dL (ref 8.9–10.3)
Chloride: 107 mmol/L (ref 98–111)
Creatinine, Ser: 0.78 mg/dL (ref 0.44–1.00)
GFR, Estimated: >60 mL/min (ref >60)
Glucose, Bld: 85 mg/dL (ref 70–99)
Potassium: 3.7 mmol/L (ref 3.5–5.1)
Sodium: 138 mmol/L (ref 135–145)

## 2020-10-04 LAB — RESP PANEL BY RT-PCR (FLU A&B, COVID) ARPGX2
Influenza A by PCR: NEGATIVE
Influenza B by PCR: NEGATIVE
SARS Coronavirus 2 by RT PCR: NEGATIVE

## 2020-10-04 LAB — ABO/RH: ABO/RH(D): A POS

## 2020-10-04 LAB — TYPE AND SCREEN

## 2020-10-04 MED ORDER — FENTANYL CITRATE (PF) 100 MCG/2ML IJ SOLN
50.0000 ug | Freq: Once | INTRAMUSCULAR | Status: AC
  Administered 2020-10-04: 50 ug via INTRAVENOUS
  Filled 2020-10-04: qty 2

## 2020-10-04 MED ORDER — ONDANSETRON 4 MG PO TBDP
4.0000 mg | ORAL_TABLET | Freq: Once | ORAL | Status: AC
  Administered 2020-10-04: 4 mg via ORAL
  Filled 2020-10-04: qty 1

## 2020-10-04 MED ORDER — PRENATAL MULTIVITAMIN CH
1.0000 | ORAL_TABLET | Freq: Every day | ORAL | Status: DC
  Administered 2020-10-05: 1 via ORAL
  Filled 2020-10-04: qty 1

## 2020-10-04 MED ORDER — ONDANSETRON HCL 4 MG/2ML IJ SOLN
4.0000 mg | Freq: Once | INTRAMUSCULAR | Status: AC
  Administered 2020-10-04: 4 mg via INTRAVENOUS
  Filled 2020-10-04: qty 2

## 2020-10-04 MED ORDER — METRONIDAZOLE 500 MG PO TABS
2000.0000 mg | ORAL_TABLET | Freq: Once | ORAL | Status: AC
  Administered 2020-10-04: 2000 mg via ORAL
  Filled 2020-10-04: qty 4

## 2020-10-04 MED ORDER — SODIUM CHLORIDE 0.9 % IV SOLN
10.0000 mL/h | Freq: Once | INTRAVENOUS | Status: AC
  Administered 2020-10-04: 10 mL/h via INTRAVENOUS

## 2020-10-04 MED ORDER — SODIUM CHLORIDE 0.9 % IV SOLN: INTRAVENOUS | Status: DC

## 2020-10-04 MED ORDER — IBUPROFEN 600 MG PO TABS
600.0000 mg | ORAL_TABLET | Freq: Four times a day (QID) | ORAL | Status: DC | PRN
  Administered 2020-10-04: 600 mg via ORAL
  Filled 2020-10-04: qty 1

## 2020-10-04 MED ORDER — TRAMADOL HCL 50 MG PO TABS
50.0000 mg | ORAL_TABLET | Freq: Four times a day (QID) | ORAL | Status: DC
  Administered 2020-10-04 – 2020-10-05 (×5): 50 mg via ORAL
  Filled 2020-10-04 (×5): qty 1

## 2020-10-04 MED ORDER — KETOROLAC TROMETHAMINE 15 MG/ML IJ SOLN
15.0000 mg | Freq: Once | INTRAMUSCULAR | Status: AC
  Administered 2020-10-04: 15 mg via INTRAVENOUS
  Filled 2020-10-04: qty 1

## 2020-10-04 NOTE — ED Provider Notes (Addendum)
[facility] EMERGENCY DEPARTMENT Provider Note   CSN: [id number] Arrival date & time: 10/04/20  7026     History Chief Complaint  Patient presents with  . Vaginal Bleeding  . Abdominal Pain    Caitlin Maddox is a 40 y.o. female.  Patient with history of copper IUD placed about 15 years ago, history of cesarean section, no other abdominal surgeries --presents the emergency department for vaginal bleeding and pelvic pain.  Patient states that her period started about 6 days ago.  She has heavy periods at baseline.  On Wednesday (two days ago) her bleeding was heavy and she developed pain that was different than typical periods.  She also notes that she is passing larger clots than usual and the blood is darker than usual.  Because of this, she wanted to be evaluated.  She had nausea and vomiting 2 days ago that has since resolved.  No urinary symptoms including dysuria.  No fevers or changes with bowel movements.        History reviewed. No pertinent past medical history.  There are no problems to display for this patient.   Past Surgical History:  Procedure Laterality Date  . CESAREAN SECTION       OB History   No obstetric history on file.     History reviewed. No pertinent family history.  Social History   Tobacco Use  . Smoking status: Current Every Day Smoker    Packs/day: 0.50    Types: Cigarettes  . Smokeless tobacco: Never Used  Substance Use Topics  . Alcohol use: No  . Drug use: No    Home Medications Prior to Admission medications   Medication Sig Start Date End Date Taking? Authorizing Provider  amoxicillin-clavulanate (AUGMENTIN) 875-125 MG tablet Take 1 tablet by mouth every 12 (twelve) hours. 05/31/19   Hall-Potvin, [place], PA-C  ibuprofen (ADVIL) 600 MG tablet Take 1 tablet (600 mg total) by mouth every 6 (six) hours as needed. 05/31/19   Hall-Potvin, [place], PA-C    Allergies    Clindamycin/lincomycin  Review of Systems    Review of Systems  Constitutional: Negative for fever.  HENT: Negative for rhinorrhea and sore throat.   Eyes: Negative for redness.  Respiratory: Negative for cough.   Cardiovascular: Negative for chest pain.  Gastrointestinal: Positive for nausea and vomiting (resolved). Negative for abdominal pain and diarrhea.  Genitourinary: Positive for pelvic pain and vaginal bleeding. Negative for dysuria, frequency, hematuria and urgency.  Musculoskeletal: Negative for myalgias.  Skin: Negative for rash.  Neurological: Negative for headaches.    Physical Exam Updated Vital Signs BP 115/67 (BP Location: Right Arm)   Pulse 70   Temp 98.8 F (37.1 C)   Resp 17   LMP 10/04/2020   SpO2 100%   Physical Exam Vitals and nursing note reviewed. Exam conducted with a chaperone present.  Constitutional:      General: She is not in acute distress.    Appearance: She is well-developed.  HENT:     Head: Normocephalic and atraumatic.     Right Ear: External ear normal.     Left Ear: External ear normal.     Nose: Nose normal.  Eyes:     Conjunctiva/sclera: Conjunctivae normal.  Cardiovascular:     Rate and Rhythm: Normal rate and regular rhythm.     Heart sounds: No murmur heard.   Pulmonary:     Effort: No respiratory distress.     Breath sounds: No wheezing, rhonchi or  rales.  Abdominal:     Palpations: Abdomen is soft.     Tenderness: There is abdominal tenderness in the suprapubic area. There is no guarding or rebound.  Genitourinary:    Vagina: Tenderness and bleeding present. No vaginal discharge.     Cervix: No friability.     Uterus: Tender.      Adnexa:        Right: Tenderness present. No fullness.         Left: No tenderness or fullness.       Comments: Patient is with dry dark blood in the vaginal vault, cleared away with several swabs.  There is no brisk bleeding at time of exam and blood present appears old.  IUD strings visualized through the cervical os.  Patient with  mild midline and right adnexal tenderness on bimanual exam. Musculoskeletal:     Cervical back: Normal range of motion and neck supple.     Right lower leg: No edema.     Left lower leg: No edema.  Skin:    General: Skin is warm and dry.     Findings: No rash.  Neurological:     General: No focal deficit present.     Mental Status: She is alert. Mental status is at baseline.     Motor: No weakness.  Psychiatric:        Mood and Affect: Mood normal.     ED Results / Procedures / Treatments   Labs (all labs ordered are listed, but only abnormal results are displayed) Labs Reviewed  WET PREP, GENITAL - Abnormal; Notable for the following components:      Result Value   Trich, Wet Prep PRESENT (*)    Clue Cells Wet Prep HPF POC PRESENT (*)    WBC, Wet Prep HPF POC MANY (*)    All other components within normal limits  CBC - Abnormal; Notable for the following components:   RBC 2.93 (*)    Hemoglobin 5.2 (*)    HCT 20.4 (*)    MCV 69.6 (*)    MCH 17.7 (*)    MCHC 25.5 (*)    RDW 23.0 (*)    All other components within normal limits  BASIC METABOLIC PANEL  I-STAT BETA HCG BLOOD, ED ([facility], [facility], [facility] ONLY)  TYPE AND SCREEN  ABO/RH  PREPARE RBC (CROSSMATCH)  GC/CHLAMYDIA PROBE AMP ([place]) NOT AT [facility]    EKG None  Radiology US PELVIC COMPLETE WITH TRANSVAGINAL  Result Date: 10/04/2020 CLINICAL DATA:  Pelvic pain EXAM: TRANSABDOMINAL AND TRANSVAGINAL ULTRASOUND OF PELVIS TECHNIQUE: Both transabdominal and transvaginal ultrasound examinations of the pelvis were performed. Transabdominal technique was performed for global imaging of the pelvis including uterus, ovaries, adnexal regions, and pelvic cul-de-sac. It was necessary to proceed with endovaginal exam following the transabdominal exam to visualize the uterus, endometrium, and ovaries/adnexa and due to suboptimal transabdominal imaging from bowel gas and inadequate acoustic window (patient emptied bladder prior to exam).  COMPARISON:  CT abdomen pelvis report from 08/29/2012, images not retrievable at the time of exam. FINDINGS: Uterus Measurements: 9.0 x 6.6 x 6.4 cm = volume: 198.8 mL. There are multiple uterine fibroids, largest measuring 5.3 x 4.8 x 5.2 cm in the subserosal uterine fundus on the right. Another fibroid measures 2.7 x 2.7 x 2.8 cm and appears submucosal in the central uterus and possibly in part intracavitary. There is an intrauterine device which appears to be down near the region of the cervix. Endometrium Thickness:  Difficult to measure due to the presence of central mass/fibroid. Right ovary Measurements: 3.8 x 2.4 x 3.0 cm = volume: 14.3 mL. Normal appearance/no adnexal mass. Small follicles noted. Left ovary Measurements: 3.5 x 2.0 x 3.2 cm = volume: 11.8 mL. Normal appearance/no adnexal mass. Small follicles noted. Other findings No abnormal free fluid. IMPRESSION: Intrauterine device is located down in the region of the cervix. Two uterine fibroids, one of which appears submucosal in possibly in part intracavitary measuring 2.8 cm. The other appears subserosal in the uterine fundus measuring up to 5.3 cm. Recommend follow-up with OB-GYN. Electronically Signed   By: Maurine Simmering   On: 10/04/2020 14:39    Procedures Procedures   Medications Ordered in ED Medications  0.9 %  sodium chloride infusion (has no administration in time range)  ketorolac (TORADOL) 15 MG/ML injection 15 mg (15 mg Intravenous Given 10/04/20 1223)  fentaNYL (SUBLIMAZE) injection 50 mcg (50 mcg Intravenous Given 10/04/20 1225)  ondansetron (ZOFRAN) injection 4 mg (4 mg Intravenous Given 10/04/20 1220)    ED Course  I have reviewed the triage vital signs and the nursing notes.  Pertinent labs & imaging results that were available during my care of the patient were reviewed by me and considered in my medical decision making (see chart for details).  Patient seen and examined. Work-up initiated. Medications ordered.   Vital  signs reviewed and are as follows: BP 120/67 (BP Location: Right Arm)   Pulse 81   Temp 98.8 F (37.1 C)   Resp 18   LMP 10/04/2020   SpO2 100%   Hemoglobin at 5.2.  Discussed risks and benefits of blood transfusion with patient and she agrees to proceed.  2 units ordered.   12:49 PM pelvic exam performed with RN chaperone.  Will obtain pelvic ultrasound and touch base with OB/GYN.  3:11 PM pelvic ultrasound shows several uterine fibroids.  Case discussed with Dr. Kennon Rounds of OB/GYN.  They will consult on patient.  CRITICAL CARE Performed by: Carlisle Cater PA-C Total critical care time: 40 minutes Critical care time was exclusive of separately billable procedures and treating other patients. Critical care was necessary to treat or prevent imminent or life-threatening deterioration. Critical care was time spent personally by me on the following activities: development of treatment plan with patient and/or surrogate as well as nursing, discussions with consultants, evaluation of patient's response to treatment, examination of patient, obtaining history from patient or surrogate, ordering and performing treatments and interventions, ordering and review of laboratory studies, ordering and review of radiographic studies, pulse oximetry and re-evaluation of patient's condition.  3:17 PM Pt updated on results including trichomonas. Flagyl ordered. COVID pending.     MDM Rules/Calculators/A&P                          Patient with heavy vaginal bleeding, uterine fibroids, anemia of 5.2.  Blood transfusion ordered.  OB/GYN consult pending.   Final Clinical Impression(s) / ED Diagnoses Final diagnoses:  Vaginal bleeding  Microcytic anemia    Rx / DC Orders ED Discharge Orders    None       Carlisle Cater, PA-C 10/04/20 1513    Carlisle Cater, PA-C 10/04/20 1517    Long, [facility], MD 10/05/20 [phone]

## 2020-10-04 NOTE — ED Notes (Signed)
Pelvic cart at the bedside 

## 2020-10-04 NOTE — ED Triage Notes (Signed)
Pt reports she is here today due to heavy vaginal bleeding since yesterday. Pt reports abd pain. Pt reports the pain is worse then usual.Pt reports she has an IUD.Pt reports nausea.

## 2020-10-04 NOTE — Plan of Care (Signed)
  Problem: Education: Goal: Knowledge of General Education information will improve Description: Including pain rating scale, medication(s)/side effects and non-pharmacologic comfort measures 10/04/2020 2254 by Kaylyn Lim, RN Outcome: Progressing 10/04/2020 2254 by Kaylyn Lim, RN Outcome: Progressing   Problem: Health Behavior/Discharge Planning: Goal: Ability to manage health-related needs will improve 10/04/2020 2254 by Kaylyn Lim, RN Outcome: Progressing 10/04/2020 2254 by Kaylyn Lim, RN Outcome: Progressing   Problem: Clinical Measurements: Goal: Ability to maintain clinical measurements within normal limits will improve 10/04/2020 2254 by Kaylyn Lim, RN Outcome: Progressing 10/04/2020 2254 by Kaylyn Lim, RN Outcome: Progressing Goal: Will remain free from infection 10/04/2020 2254 by Kaylyn Lim, RN Outcome: Progressing 10/04/2020 2254 by Kaylyn Lim, RN Outcome: Progressing Goal: Diagnostic test results will improve 10/04/2020 2254 by Kaylyn Lim, RN Outcome: Progressing 10/04/2020 2254 by Kaylyn Lim, RN Outcome: Progressing Goal: Respiratory complications will improve 10/04/2020 2254 by Kaylyn Lim, RN Outcome: Progressing 10/04/2020 2254 by Kaylyn Lim, RN Outcome: Progressing Goal: Cardiovascular complication will be avoided 10/04/2020 2254 by Kaylyn Lim, RN Outcome: Progressing 10/04/2020 2254 by Kaylyn Lim, RN Outcome: Progressing   Problem: Activity: Goal: Risk for activity intolerance will decrease 10/04/2020 2254 by Kaylyn Lim, RN Outcome: Progressing 10/04/2020 2254 by Kaylyn Lim, RN Outcome: Progressing   Problem: Coping: Goal: Level of anxiety will decrease 10/04/2020 2254 by Kaylyn Lim, RN Outcome: Progressing 10/04/2020 2254 by Kaylyn Lim, RN Outcome: Progressing   Problem: Pain Managment: Goal: General experience of comfort will improve 10/04/2020 2254 by Kaylyn Lim, RN Outcome: Progressing 10/04/2020 2254 by Kaylyn Lim,  RN Outcome: Progressing   Problem: Safety: Goal: Ability to remain free from injury will improve 10/04/2020 2254 by Kaylyn Lim, RN Outcome: Progressing 10/04/2020 2254 by Kaylyn Lim, RN Outcome: Progressing

## 2020-10-04 NOTE — H&P (Signed)
Caitlin Maddox is an 40 y.o. female P3004 who presented to the ED for the evaluation of heavy vaginal bleeding and pelvic pain. Patient reports a history of a monthly period lasting 6-9 days. Her periods have always been heavy with passage of large clots. She states her cycles became heavier following the insertion of her Paraguard IUD 15 years ago. Patient presented today because her menses was a darker color than usual. Patient is not currently bleeding right now. Patient has not been evaluated for this secondary to lack of insurance. Patient denies feeling lightheaded, chest pain or short of breath.  Pertinent Gynecological History: Menses: regular every month without intermenstrual spotting Contraception: IUD DES exposure: denies Blood transfusions: none Previous GYN Procedures: n/a  Last mammogram: n/a  OB History: G3, P3004   Menstrual History: Patient's last menstrual period was 10/04/2020.    History reviewed. No pertinent past medical history.  Past Surgical History:  Procedure Laterality Date  . CESAREAN SECTION      History reviewed. No pertinent family history.  Social History:  reports that she has been smoking cigarettes. She has been smoking about 0.50 packs per day. She has never used smokeless tobacco. She reports that she does not drink alcohol and does not use drugs.  Allergies:  Allergies  Allergen Reactions  . Clindamycin/Lincomycin Anaphylaxis    swelling    (Not in a hospital admission)   Review of Systems See pertinent in HPI. All other systems reviewed and non contributory Blood pressure 104/61, pulse (!) 59, temperature 98.8 F (37.1 C), resp. rate 20, last menstrual period 10/04/2020, SpO2 100 %. Physical Exam GENERAL: Well-developed, well-nourished female in no acute distress.  HEENT: Normocephalic, atraumatic. Sclerae anicteric.  NECK: Supple. Normal thyroid.  LUNGS: Clear to auscultation bilaterally.  HEART: Regular rate and rhythm. BREASTS:  Symmetric in size. No palpable masses or lymphadenopathy, skin changes, or nipple drainage. ABDOMEN: Soft, nontender, nondistended. No organomegaly. PELVIC: deferred EXTREMITIES: No cyanosis, clubbing, or edema, 2+ distal pulses.  Results for orders placed or performed during the hospital encounter of 10/04/20 (from the past 24 hour(s))  CBC     Status: Abnormal   Collection Time: 10/04/20 10:12 AM  Result Value Ref Range   WBC 7.6 4.0 - 10.5 K/uL   RBC 2.93 (L) 3.87 - 5.11 MIL/uL   Hemoglobin 5.2 (LL) 12.0 - 15.0 g/dL   HCT 20.4 (L) 36.0 - 46.0 %   MCV 69.6 (L) 80.0 - 100.0 fL   MCH 17.7 (L) 26.0 - 34.0 pg   MCHC 25.5 (L) 30.0 - 36.0 g/dL   RDW 23.0 (H) 11.5 - 15.5 %   Platelets 253 150 - 400 K/uL   nRBC 0.0 0.0 - 0.2 %  Basic metabolic panel     Status: None   Collection Time: 10/04/20 10:12 AM  Result Value Ref Range   Sodium 138 135 - 145 mmol/L   Potassium 3.7 3.5 - 5.1 mmol/L   Chloride 107 98 - 111 mmol/L   CO2 25 22 - 32 mmol/L   Glucose, Bld 85 70 - 99 mg/dL   BUN 6 6 - 20 mg/dL   Creatinine, Ser 0.78 0.44 - 1.00 mg/dL   Calcium 9.4 8.9 - 10.3 mg/dL   GFR, Estimated >60 >60 mL/min   Anion gap 6 5 - 15  ABO/Rh     Status: None   Collection Time: 10/04/20 10:12 AM  Result Value Ref Range   ABO/RH(D)      A POS Performed  at [facility], [place] [address], [place], [place] [address]   I-Stat Beta hCG blood, ED ([facility], [facility], [facility] only)     Status: None   Collection Time: 10/04/20 10:46 AM  Result Value Ref Range   I-stat hCG, quantitative <5.0 <5 mIU/mL   Comment 3          Type and screen     Status: None (Preliminary result)   Collection Time: 10/04/20 12:16 PM  Result Value Ref Range   ABO/RH(D) A POS    Antibody Screen NEG    Sample Expiration 10/07/2020,2359    Unit Number [id number]    Blood Component Type RBC LR PHER2    Unit division 00    Status of Unit ALLOCATED    Transfusion Status OK TO TRANSFUSE    Crossmatch Result      Compatible Performed  at [facility], [place] [address] [place], [place] [address]    Unit Number [id number]    Blood Component Type RED CELLS,LR    Unit division 00    Status of Unit ALLOCATED    Transfusion Status OK TO TRANSFUSE    Crossmatch Result Compatible   Prepare RBC (crossmatch)     Status: None   Collection Time: 10/04/20 12:42 PM  Result Value Ref Range   Order Confirmation      ORDER PROCESSED BY BLOOD BANK Performed at [facility], [place] [address] [place], [place] [address]   Wet prep, genital     Status: Abnormal   Collection Time: 10/04/20 12:48 PM   Specimen: PATH Cytology Cervicovaginal Ancillary Only  Result Value Ref Range   Yeast Wet Prep HPF POC NONE SEEN NONE SEEN   Trich, Wet Prep PRESENT (A) NONE SEEN   Clue Cells Wet Prep HPF POC PRESENT (A) NONE SEEN   WBC, Wet Prep HPF POC MANY (A) NONE SEEN   Sperm NONE SEEN     US PELVIC COMPLETE WITH TRANSVAGINAL  Result Date: 10/04/2020 CLINICAL DATA:  Pelvic pain EXAM: TRANSABDOMINAL AND TRANSVAGINAL ULTRASOUND OF PELVIS TECHNIQUE: Both transabdominal and transvaginal ultrasound examinations of the pelvis were performed. Transabdominal technique was performed for global imaging of the pelvis including uterus, ovaries, adnexal regions, and pelvic cul-de-sac. It was necessary to proceed with endovaginal exam following the transabdominal exam to visualize the uterus, endometrium, and ovaries/adnexa and due to suboptimal transabdominal imaging from bowel gas and inadequate acoustic window (patient emptied bladder prior to exam). COMPARISON:  CT abdomen pelvis report from 08/29/2012, images not retrievable at the time of exam. FINDINGS: Uterus Measurements: 9.0 x 6.6 x 6.4 cm = volume: 198.8 mL. There are multiple uterine fibroids, largest measuring 5.3 x 4.8 x 5.2 cm in the subserosal uterine fundus on the right. Another fibroid measures 2.7 x 2.7 x 2.8 cm and appears submucosal in the central uterus and possibly in part  intracavitary. There is an intrauterine device which appears to be down near the region of the cervix. Endometrium Thickness: Difficult to measure due to the presence of central mass/fibroid. Right ovary Measurements: 3.8 x 2.4 x 3.0 cm = volume: 14.3 mL. Normal appearance/no adnexal mass. Small follicles noted. Left ovary Measurements: 3.5 x 2.0 x 3.2 cm = volume: 11.8 mL. Normal appearance/no adnexal mass. Small follicles noted. Other findings No abnormal free fluid. IMPRESSION: Intrauterine device is located down in the region of the cervix. Two uterine fibroids, one of which appears submucosal in possibly in part intracavitary measuring 2.8 cm.  The other appears subserosal in the uterine fundus measuring up to 5.3 cm. Recommend follow-up with OB-GYN. Electronically Signed   By: Maurine Simmering   On: 10/04/2020 14:39    Assessment/Plan: 40 yo with menorrhagia admitted with anemia - Will transfuse 2 units pRBC - Discussed medical management with hormonal contraception following removal of the expired Paraguard IUD - Admit to inpatient  Caitlin Maddox 10/04/2020, 3:15 PM

## 2020-10-04 NOTE — ED Notes (Signed)
Tried to call report. Will call back in 30 minutes

## 2020-10-04 NOTE — Progress Notes (Signed)
NEW ADMISSION NOTE New Admission Note:    Arrival Method: [place] Mental Orientation: A&O x4 Telemetry: n/a  Assessment: Completed Skin: Intact IV: Clean, dry, intact Pain:7/10 (see pain assessment)  Tubes: none Safety Measures: Safety Fall Prevention Plan has been given, discussed and signed Admission: Completed [facility] Orientation: Patient has been orientated to the room, unit and staff.  Family: none present   Orders have been reviewed and implemented. Will continue to monitor the patient. Call light has been placed within reach and bed alarm has been activated.    Leanna Battles, RN, CMSRN, PCCN

## 2020-10-05 DIAGNOSIS — D500 Iron deficiency anemia secondary to blood loss (chronic): Principal | ICD-10-CM

## 2020-10-05 LAB — CBC
HCT: 20.1 % — ABNORMAL LOW (ref 36.0–46.0)
HCT: 25.4 % — ABNORMAL LOW (ref 36.0–46.0)
Hemoglobin: 5.8 g/dL — CL (ref 12.0–15.0)
Hemoglobin: 7.6 g/dL — ABNORMAL LOW (ref 12.0–15.0)
MCH: 20.3 pg — ABNORMAL LOW (ref 26.0–34.0)
MCH: 21.8 pg — ABNORMAL LOW (ref 26.0–34.0)
MCHC: 28.9 g/dL — ABNORMAL LOW (ref 30.0–36.0)
MCHC: 29.9 g/dL — ABNORMAL LOW (ref 30.0–36.0)
MCV: 70.3 fL — ABNORMAL LOW (ref 80.0–100.0)
MCV: 72.8 fL — ABNORMAL LOW (ref 80.0–100.0)
Platelets: 188 10*3/uL (ref 150–400)
Platelets: 180 10*3/uL (ref 150–400)
RBC: 2.86 MIL/uL — ABNORMAL LOW (ref 3.87–5.11)
RBC: 3.49 MIL/uL — ABNORMAL LOW (ref 3.87–5.11)
RDW: 22.7 % — ABNORMAL HIGH (ref 11.5–15.5)
RDW: 22.5 % — ABNORMAL HIGH (ref 11.5–15.5)
WBC: 6.8 10*3/uL (ref 4.0–10.5)
WBC: 6.9 10*3/uL (ref 4.0–10.5)
nRBC: 0.3 % — ABNORMAL HIGH (ref 0.0–0.2)
nRBC: 0.3 % — ABNORMAL HIGH (ref 0.0–0.2)

## 2020-10-05 LAB — TYPE AND SCREEN
ABO/RH(D): A POS
Unit division: 0

## 2020-10-05 LAB — PREPARE RBC (CROSSMATCH)

## 2020-10-05 MED ORDER — IBUPROFEN 600 MG PO TABS: 600.0000 mg | ORAL_TABLET | Freq: Four times a day (QID) | ORAL | 0 refills | Status: DC | PRN

## 2020-10-05 MED ORDER — ACETAMINOPHEN 325 MG PO TABS
650.0000 mg | ORAL_TABLET | Freq: Once | ORAL | Status: AC
Start: 2020-10-05 — End: 2020-10-05
  Administered 2020-10-05: 650 mg via ORAL
  Filled 2020-10-05: qty 2

## 2020-10-05 MED ORDER — MEGESTROL ACETATE 40 MG PO TABS: 40.0000 mg | ORAL_TABLET | Freq: Two times a day (BID) | ORAL | 5 refills | Status: DC

## 2020-10-05 MED ORDER — DIPHENHYDRAMINE HCL 25 MG PO CAPS
25.0000 mg | ORAL_CAPSULE | Freq: Once | ORAL | Status: AC
  Administered 2020-10-05: 25 mg via ORAL
  Filled 2020-10-05: qty 1

## 2020-10-05 MED ORDER — SODIUM CHLORIDE 0.9% FLUSH: 3.0000 mL | INTRAVENOUS | Status: DC | PRN

## 2020-10-05 MED ORDER — SODIUM CHLORIDE 0.9% IV SOLUTION: Freq: Once | INTRAVENOUS | Status: AC

## 2020-10-05 MED ORDER — FERROUS SULFATE 325 (65 FE) MG PO TABS: 325.0000 mg | ORAL_TABLET | Freq: Two times a day (BID) | ORAL | 1 refills | Status: DC

## 2020-10-05 NOTE — Progress Notes (Signed)
At 0653H, received orders for BT 2 units. Handed over to day shift RN.

## 2020-10-05 NOTE — Discharge Summary (Signed)
Physician Discharge Summary  Patient ID: Caitlin Maddox MRN: [id number] DOB/AGE: 40-Dec-1982 40 y.o.  Admit date: 10/04/2020 Discharge date: 10/05/2020  Admission Diagnoses:anemia secondary to iron deficiency due to menorrhagia  Discharge Diagnoses:  Active Problems:   Symptomatic anemia   Discharged Condition: good  Hospital Course: Patient admitted with anemia associated with menorrhagia. Patient received 4 units of packed red blood cells. She was not having vaginal bleeding at the time of admission. Patient ambulated, tolerated po and voided without complications. Patient stable for discharge. Discharge instructions reviewed with the patient. Patient discharge on ferrous sulfate and megace.  Consults: None   Discharge Exam: Blood pressure 113/73, pulse 60, temperature 98.5 F (36.9 C), temperature source Oral, resp. rate 16, last menstrual period 10/04/2020, SpO2 100 %. General appearance: alert, cooperative and no distress Resp: clear to auscultation bilaterally Cardio: regular rate and rhythm GI: soft, non-tender; bowel sounds normal; no masses,  no organomegaly  Disposition:    Allergies as of 10/05/2020      Reactions   Clindamycin/lincomycin Anaphylaxis   swelling      Medication List    STOP taking these medications   amoxicillin-clavulanate 875-125 MG tablet Commonly known as: AUGMENTIN     TAKE these medications   cetirizine 10 MG tablet Commonly known as: ZYRTEC Take 10 mg by mouth daily as needed for allergies.   ferrous sulfate 325 (65 FE) MG tablet Commonly known as: FerrouSul Take 1 tablet (325 mg total) by mouth 2 (two) times daily.   ibuprofen 600 MG tablet Commonly known as: ADVIL Take 1 tablet (600 mg total) by mouth every 6 (six) hours as needed (mild pain). What changed: reasons to take this   megestrol 40 MG tablet Commonly known as: MEGACE Take 1 tablet (40 mg total) by mouth 2 (two) times daily. Can increase to two tablets twice a day  in the event of heavy bleeding       Follow-up [facility] for [facility] at [facility] Follow up.   Specialty: Obstetrics and Gynecology Why: An appointment will be made for you to follow up in 2 weeks Contact information: [place] [address] [phone]              Signed: Mora Bellman 10/05/2020, 7:56 PM

## 2020-10-05 NOTE — Progress Notes (Signed)
Patient discharge to home.  Discharge instructions reviewed with patient and all questions answered.  PIV removed.  All personal belongings including cell phone with patient at time of discharge.  Patient's mother to met her at [place] RN

## 2020-10-05 NOTE — Discharge Instructions (Signed)
Abnormal Uterine Bleeding  Abnormal uterine bleeding is unusual bleeding from the uterus. It includes bleeding after sex, or bleeding or spotting between menstrual periods. It may also include bleeding that is heavier than normal, menstrual periods that last longer than usual, or bleeding that occurs after menopause. Abnormal uterine bleeding can affect teenagers, women in their reproductive years, pregnant women, and women who have reached menopause. Common causes of abnormal uterine bleeding include:  Pregnancy.  Growths of tissue (polyps).  Benign tumors or growths in the uterus (fibroids). These are not cancer.  Infection.  Cancer.  Too much or too little of some hormones in the body (hormonal imbalances). Any type of abnormal bleeding should be checked by a health care provider. Many cases are minor and simple to treat, but others may be more serious. Treatment will depend on the cause and severity of the bleeding. Follow these instructions at home: Medicines  Take over-the-counter and prescription medicines only as told by your health care provider.  Tell your health care provider about other medicines that you take. You may be asked to stop taking aspirin or medicines that contain aspirin. These medicines can make bleeding worse.  If you were prescribed iron pills, take them as told by your health care provider. Iron pills help to replace iron that your body loses because of this condition. Managing constipation In cases of severe bleeding, you may be asked to increase your iron intake to treat anemia. This may cause constipation. To prevent or treat constipation, you may need to:  Drink enough fluid to keep your urine pale yellow.  Take over-the-counter or prescription medicines.  Eat foods that are high in fiber, such as beans, whole grains, and fresh fruits and vegetables.  Limit foods that are high in fat and processed sugars, such as fried or sweet foods. General  instructions  Monitor your condition for any changes.  Do not use tampons, douche, or have sex until your health care provider says these things are okay.  Change your pads often.  Get regular exams. This includes pelvic exams and cervical cancer screenings. ? It is up to you to get the results of any tests that are done. Ask your health care provider, or the department that is doing the tests, when your results will be ready.  Keep all follow-up visits as told by your health care provider. This is important. Contact a health care provider if you:  Have bleeding that lasts for more than 1 week.  Feel dizzy at times.  Feel nauseous or you vomit.  Feel light-headed or weak.  Notice any other changes that show that your condition is getting worse. Get help right away if you:  Pass out.  Have bleeding that soaks through a pad every hour.  Have pain in the abdomen.  Have a fever or chills.  Become sweaty or weak.  Pass large blood clots from your vagina. Summary  Abnormal uterine bleeding is unusual bleeding from the uterus.  Any type of abnormal bleeding should be evaluated by a health care provider. Many cases are minor and simple to treat, but others may be more serious.  Treatment will depend on the cause of the bleeding.  Get help right away if you pass out, you have bleeding that soaks through a pad every hour, or you pass large blood clots from your vagina. This information is not intended to replace advice given to you by your health care provider. Make sure you discuss any questions you  have with your health care provider. Document Revised: 01/03/2020 Document Reviewed: 02/28/2019 Elsevier Patient Education  2021 [place].   Iron Deficiency Anemia, Adult Iron deficiency anemia is when you do not have enough red blood cells or hemoglobin in your blood. This happens because you have too little iron in your body. Hemoglobin carries oxygen to parts of the body.  Anemia can cause your body to not get enough oxygen. What are the causes?  Not eating enough foods that have iron in them.  The body not being able to take in iron well.  Needing more iron due to pregnancy or heavy menstrual periods, for females.  Cancer.  Bleeding in the bowels.  Many blood draws. What increases the risk?  Being pregnant.  Being a teenage girl going through a growth spurt. What are the signs or symptoms?  Pale skin, lips, and nails.  Weakness, dizziness, and getting tired easily.  Headache.  Feeling like you cannot breathe well when moving (shortness of breath).  Cold hands and feet.  Fast heartbeat or a heartbeat that is not regular.  Feeling grouchy (irritable) or breathing fast. These are more common in very bad anemia. Mild anemia may not cause any symptoms. How is this treated? This condition is treated by finding out why you do not have enough iron and then getting more iron. It may include:  Adding foods to your diet that have a lot of iron.  Taking iron pills (supplements). If you are pregnant or breastfeeding, you may need to take extra iron. Your diet often does not provide the amount of iron that you need.  Getting more vitamin C in your diet. Vitamin C helps your body take in iron. You may need to take iron pills with a glass of orange juice or vitamin C pills.  Medicines to make heavy menstrual periods lighter.  Surgery. You may need blood tests to see if treatment is working. If the treatment does not seem to be working, you may need more tests. Follow these instructions at home: Medicines  Take over-the-counter and prescription medicines only as told by your doctor. This includes iron pills and vitamins. ? Take iron pills when your stomach is empty. If you cannot handle this, take them with food. ? Do not drink milk or take antacids at the same time as your iron pills. ? Iron pills may turn your poop (stool)black.  If you cannot  handle taking iron pills by mouth, ask your doctor about getting iron through: ? An IV tube. ? A shot (injection) into a muscle. Eating and drinking  Talk with your doctor before changing the foods you eat. He or she may tell you to eat foods that have a lot of iron, such as: ? Liver. ? Low-fat (lean) beef. ? Breads and cereals that have iron added to them. ? Eggs. ? Dried fruit. ? Dark green, leafy vegetables.  Eat fresh fruits and vegetables that are high in vitamin C. They help your body use iron. Foods with a lot of vitamin C include: ? Oranges. ? Peppers. ? Tomatoes. ? Mangoes.  Drink enough fluid to keep your pee (urine) pale yellow.   Managing constipation If you are taking iron pills, they may cause trouble pooping (constipation). To prevent or treat trouble pooping, you may need to:  Take over-the-counter or prescription medicines.  Eat foods that are high in fiber. These include beans, whole grains, and fresh fruits and vegetables.  Limit foods that are high in  fat and sugar. These include fried or sweet foods. General instructions  Return to your normal activities as told by your doctor. Ask your doctor what activities are safe for you.  Keep yourself clean, and keep things clean around you.  Keep all follow-up visits as told by your doctor. This is important. Contact a doctor if:  You feel like you may vomit (nauseous), or you vomit.  You feel weak.  You are sweating for no reason.  You have trouble pooping, such as: ? Pooping less than 3 times a week. ? Straining to poop. ? Having poop that is hard, dry, or larger than normal. ? Feeling full or bloated. ? Pain in the lower belly. ? Not feeling better after pooping. Get help right away if:  You pass out (faint).  You have chest pain.  You have trouble breathing that: ? Is very bad. ? Gets worse with physical activity.  You have a fast heartbeat, or a heartbeat that does not feel regular.  You  get light-headed when getting up from sitting or lying down. These symptoms may be an emergency. Do not wait to see if the symptoms will go away. Get medical help right away. Call your local emergency services (911 in the [place] Do not drive yourself to the hospital. Summary  Iron deficiency anemia is when you have too little iron in your body.  This condition is treated by finding out why you do not have enough iron in your body and then getting more iron.  Take over-the-counter and prescription medicines only as told by your doctor.  Eat fresh fruits and vegetables that are high in vitamin C.  Get help right away if you cannot breathe well. This information is not intended to replace advice given to you by your health care provider. Make sure you discuss any questions you have with your health care provider. Document Revised: 01/03/2019 Document Reviewed: 01/03/2019 Elsevier Patient Education  [place].

## 2020-10-06 LAB — TYPE AND SCREEN
Antibody Screen: NEGATIVE
Unit division: 0
Unit division: 0
Unit division: 0

## 2020-10-06 LAB — BPAM RBC
Blood Product Expiration Date: 202206032359
Blood Product Expiration Date: 202206032359
Blood Product Expiration Date: 202206062359
Blood Product Expiration Date: 202206102359
ISSUE DATE / TIME: 202205271524
ISSUE DATE / TIME: 202205271706
ISSUE DATE / TIME: 202205281451
ISSUE DATE / TIME: 202205281723
Unit Type and Rh: 6200
Unit Type and Rh: 6200
Unit Type and Rh: 6200
Unit Type and Rh: 6200

## 2020-10-08 LAB — GC/CHLAMYDIA PROBE AMP (~~LOC~~) NOT AT ARMC
Chlamydia: NEGATIVE
Comment: NEGATIVE
Comment: NORMAL
Neisseria Gonorrhea: NEGATIVE

## 2021-01-28 ENCOUNTER — Ambulatory Visit
Admission: EM | Admit: 2021-01-28 | Discharge: 2021-01-28 | Disposition: A | Discharge status: Home / Self Care | Payer: PRIVATE HEALTH INSURANCE | Attending: Internal Medicine | Admitting: Internal Medicine

## 2021-01-28 ENCOUNTER — Ambulatory Visit (INDEPENDENT_AMBULATORY_CARE_PROVIDER_SITE_OTHER): Payer: PRIVATE HEALTH INSURANCE

## 2021-01-28 DIAGNOSIS — M25572 Pain in left ankle and joints of left foot: Secondary | ICD-10-CM

## 2021-01-28 MED ORDER — IBUPROFEN 600 MG PO TABS: 600.0000 mg | ORAL_TABLET | Freq: Four times a day (QID) | ORAL | 0 refills | Status: DC | PRN

## 2021-01-28 NOTE — Discharge Instructions (Signed)
Please take ibuprofen as needed for pain and inflammation.  Please do not take any additional ibuprofen, Advil, Aleve in addition to this medication.  Also use ice application and elevate extremity.  Follow-up if pain persists.

## 2021-01-28 NOTE — ED Provider Notes (Signed)
[name]    CSN: [id number] Arrival date & time: 01/28/21  1447      History   Chief Complaint Chief Complaint  Patient presents with   Ankle Pain    left   Leg Pain    left   Back Pain    lumbar    HPI Caitlin Maddox is a 40 y.o. female.   Patient presents with left-sided lateral ankle pain that started yesterday.  Patient states that pain radiates up entire leg into the back.  Denies any numbness or tingling.  Denies any recent or past injury to the lower extremity or back.  Has been wearing Ace wrap and taking Tylenol with no improvement in pain.  Is able to bear weight but has difficulty.  Also has associated swelling to left ankle.  Patient reports that she does work long shifts on concrete floor standing for long periods at a time.   Ankle Pain Leg Pain Back Pain Associated symptoms: leg pain    History reviewed. No pertinent past medical history.  Patient Active Problem List   Diagnosis Date Noted   Symptomatic anemia 10/04/2020    Past Surgical History:  Procedure Laterality Date   CESAREAN SECTION      OB History   No obstetric history on file.      Home Medications    Prior to Admission medications   Medication Sig Start Date End Date Taking? Authorizing Provider  cetirizine (ZYRTEC) 10 MG tablet Take 10 mg by mouth daily as needed for allergies.    [provider]  ferrous sulfate (FERROUSUL) 325 (65 FE) MG tablet Take 1 tablet (325 mg total) by mouth 2 (two) times daily. 10/05/20   Constant, Peggy, MD  ibuprofen (ADVIL) 600 MG tablet Take 1 tablet (600 mg total) by mouth every 6 (six) hours as needed (mild pain). 01/28/21   Odis Luster, FNP  megestrol (MEGACE) 40 MG tablet Take 1 tablet (40 mg total) by mouth 2 (two) times daily. Can increase to two tablets twice a day in the event of heavy bleeding 10/05/20   Constant, Vickii Chafe, MD    Family History History reviewed. No pertinent family history.  Social History Social  History   Tobacco Use   Smoking status: Former    Packs/day: 0.50    Types: Cigarettes    Quit date: 10/09/2020    Years since quitting: 0.3   Smokeless tobacco: Never  Substance Use Topics   Alcohol use: No   Drug use: No     Allergies   Clindamycin/lincomycin   Review of Systems Review of Systems Per HPI  Physical Exam Triage Vital Signs ED Triage Vitals  Enc Vitals Group     BP 01/28/21 1623 128/85     Pulse Rate 01/28/21 1623 83     Resp 01/28/21 1623 18     Temp 01/28/21 1623 98.2 F (36.8 C)     Temp Source 01/28/21 1623 Oral     SpO2 01/28/21 1623 98 %     Weight --      Height --      Head Circumference --      Peak Flow --      Pain Score 01/28/21 1626 8     Pain Loc --      Pain Edu? --      Excl. in [place]? --    No data found.  Updated Vital Signs BP 128/85 (BP Location: Right Arm)   Pulse  83   Temp 98.2 F (36.8 C) (Oral)   Resp 18   LMP 01/20/2021 (Exact Date)   SpO2 98%   Visual Acuity Right Eye Distance:   Left Eye Distance:   Bilateral Distance:    Right Eye Near:   Left Eye Near:    Bilateral Near:     Physical Exam Constitutional:      Appearance: Normal appearance.  HENT:     Head: Normocephalic and atraumatic.  Eyes:     Extraocular Movements: Extraocular movements intact.     Conjunctiva/sclera: Conjunctivae normal.  Pulmonary:     Effort: Pulmonary effort is normal.  Musculoskeletal:     Cervical back: Normal.     Thoracic back: Normal.     Lumbar back: Normal.     Right hip: Normal.     Left hip: Normal.     Right upper leg: Normal.     Left upper leg: Normal.     Right knee: Normal.     Left knee: Normal.     Right lower leg: Normal.     Left lower leg: Normal.     Right ankle: Normal.     Left ankle: Swelling present. Tenderness present. Normal pulse.     Right foot: Normal.     Left foot: Normal.     Comments: Tenderness to palpation with associated swelling to lateral malleolus of left ankle.  No tenderness  to palpation of left foot.  Neurovascular intact.  No tenderness to palpation to back or entirety of left leg.  Neurological:     General: No focal deficit present.     Mental Status: She is alert and oriented to person, place, and time. Mental status is at baseline.     Deep Tendon Reflexes: Reflexes are normal and symmetric.  Psychiatric:        Mood and Affect: Mood normal.        Behavior: Behavior normal.        Thought Content: Thought content normal.        Judgment: Judgment normal.     UC Treatments / Results  Labs (all labs ordered are listed, but only abnormal results are displayed) Labs Reviewed - No data to display  EKG   Radiology DG Ankle Complete Left  Result Date: 01/28/2021 CLINICAL DATA:  Left ankle pain. EXAM: LEFT ANKLE COMPLETE - 3+ VIEW COMPARISON:  None. FINDINGS: There is no evidence of fracture, dislocation, or joint effusion. Ankle mortise is preserved. Tiny plantar calcaneal spur. There is no evidence of arthropathy or other focal bone abnormality. Mild soft tissue edema. IMPRESSION: Mild soft tissue edema without acute osseous abnormality. Electronically Signed   By: Keith Rake M.D.   On: 01/28/2021 18:02    Procedures Procedures (including critical care time)  Medications Ordered in UC Medications - No data to display  Initial Impression / Assessment and Plan / UC Course  I have reviewed the triage vital signs and the nursing notes.  Pertinent labs & imaging results that were available during my care of the patient were reviewed by me and considered in my medical decision making (see chart for details).     X-ray was negative for any acute bony abnormality.  Did show soft tissue edema.  Etiology is unknown but can be caused from standing for long periods of time on concrete floor.  Ibuprofen to take as needed for pain and inflammation.  Advised patient to use ice application and elevate extremity.  Patient  to follow-up if pain persists.  Low  suspicion for DVT given patient's physical exam.Discussed strict return precautions. Patient verbalized understanding and is agreeable with plan.  Final Clinical Impressions(s) / UC Diagnoses   Final diagnoses:  Acute left ankle pain     Discharge Instructions      Please take ibuprofen as needed for pain and inflammation.  Please do not take any additional ibuprofen, Advil, Aleve in addition to this medication.  Also use ice application and elevate extremity.  Follow-up if pain persists.     ED Prescriptions     Medication Sig Dispense Auth. Provider   ibuprofen (ADVIL) 600 MG tablet Take 1 tablet (600 mg total) by mouth every 6 (six) hours as needed (mild pain). 30 tablet Odis Luster, FNP      PDMP not reviewed this encounter.   Odis Luster, [place] 01/28/21 [phone]

## 2021-01-28 NOTE — ED Triage Notes (Signed)
Onset last night of left ankle pain that radiates up the posterior aspect of her leg and thigh before stopping at her low back. Has been wearing  an ace wrap and taking tylenol without a decrease in sxs. Notes difficulty bearing weight. Denies n/t in LLE. Confirms swelling and tenderness to the touch. No falls or injuries.

## 2021-02-01 ENCOUNTER — Other Ambulatory Visit: Payer: Self-pay

## 2021-02-01 ENCOUNTER — Emergency Department (HOSPITAL_COMMUNITY): Payer: PRIVATE HEALTH INSURANCE

## 2021-02-01 ENCOUNTER — Inpatient Hospital Stay (HOSPITAL_COMMUNITY)
Admission: EM | Admit: 2021-02-01 | Discharge: 2021-02-05 | DRG: 603 | Disposition: A | Discharge status: Home / Self Care | Payer: PRIVATE HEALTH INSURANCE | Attending: Family Medicine | Admitting: Family Medicine

## 2021-02-01 ENCOUNTER — Encounter (HOSPITAL_COMMUNITY): Payer: Self-pay | Admitting: Emergency Medicine

## 2021-02-01 ENCOUNTER — Encounter: Payer: Self-pay | Admitting: Internal Medicine

## 2021-02-01 DIAGNOSIS — D259 Leiomyoma of uterus, unspecified: Secondary | ICD-10-CM | POA: Diagnosis present

## 2021-02-01 DIAGNOSIS — N920 Excessive and frequent menstruation with regular cycle: Secondary | ICD-10-CM | POA: Diagnosis present

## 2021-02-01 DIAGNOSIS — N938 Other specified abnormal uterine and vaginal bleeding: Secondary | ICD-10-CM | POA: Insufficient documentation

## 2021-02-01 DIAGNOSIS — L0390 Cellulitis, unspecified: Secondary | ICD-10-CM | POA: Diagnosis present

## 2021-02-01 DIAGNOSIS — Z79899 Other long term (current) drug therapy: Secondary | ICD-10-CM

## 2021-02-01 DIAGNOSIS — N939 Abnormal uterine and vaginal bleeding, unspecified: Secondary | ICD-10-CM | POA: Diagnosis present

## 2021-02-01 DIAGNOSIS — Z881 Allergy status to other antibiotic agents status: Secondary | ICD-10-CM

## 2021-02-01 DIAGNOSIS — L03317 Cellulitis of buttock: Secondary | ICD-10-CM | POA: Diagnosis not present

## 2021-02-01 DIAGNOSIS — L0231 Cutaneous abscess of buttock: Secondary | ICD-10-CM | POA: Diagnosis present

## 2021-02-01 DIAGNOSIS — Z20822 Contact with and (suspected) exposure to covid-19: Secondary | ICD-10-CM | POA: Diagnosis present

## 2021-02-01 DIAGNOSIS — L03115 Cellulitis of right lower limb: Secondary | ICD-10-CM | POA: Diagnosis present

## 2021-02-01 DIAGNOSIS — L539 Erythematous condition, unspecified: Secondary | ICD-10-CM | POA: Diagnosis not present

## 2021-02-01 DIAGNOSIS — D500 Iron deficiency anemia secondary to blood loss (chronic): Secondary | ICD-10-CM | POA: Diagnosis present

## 2021-02-01 HISTORY — DX: Anemia, unspecified: D64.9

## 2021-02-01 HISTORY — DX: Other specified abnormal uterine and vaginal bleeding: N93.8

## 2021-02-01 LAB — BASIC METABOLIC PANEL
Anion gap: 8 (ref 5–15)
BUN: <5 mg/dL — ABNORMAL LOW (ref 6–20)
CO2: 24 mmol/L (ref 22–32)
Calcium: 9.5 mg/dL (ref 8.9–10.3)
Chloride: 103 mmol/L (ref 98–111)
Creatinine, Ser: 0.83 mg/dL (ref 0.44–1.00)
GFR, Estimated: >60 mL/min (ref >60)
Glucose, Bld: 98 mg/dL (ref 70–99)
Potassium: 4 mmol/L (ref 3.5–5.1)
Sodium: 135 mmol/L (ref 135–145)

## 2021-02-01 LAB — CBC WITH DIFFERENTIAL/PLATELET
Abs Immature Granulocytes: 0.04 10*3/uL (ref 0.00–0.07)
Basophils Absolute: 0 10*3/uL (ref 0.0–0.1)
Basophils Relative: 0 %
Eosinophils Absolute: 0 10*3/uL (ref 0.0–0.5)
Eosinophils Relative: 0 %
HCT: 26.3 % — ABNORMAL LOW (ref 36.0–46.0)
Hemoglobin: 7.3 g/dL — ABNORMAL LOW (ref 12.0–15.0)
Immature Granulocytes: 0 %
Lymphocytes Relative: 14 %
Lymphs Abs: 1.5 10*3/uL (ref 0.7–4.0)
MCH: 21 pg — ABNORMAL LOW (ref 26.0–34.0)
MCHC: 27.8 g/dL — ABNORMAL LOW (ref 30.0–36.0)
MCV: 75.8 fL — ABNORMAL LOW (ref 80.0–100.0)
Monocytes Absolute: 1.2 10*3/uL — ABNORMAL HIGH (ref 0.1–1.0)
Monocytes Relative: 12 %
Neutro Abs: 7.5 10*3/uL (ref 1.7–7.7)
Neutrophils Relative %: 74 %
Platelets: 234 10*3/uL (ref 150–400)
RBC: 3.47 MIL/uL — ABNORMAL LOW (ref 3.87–5.11)
RDW: 17.4 % — ABNORMAL HIGH (ref 11.5–15.5)
WBC: 10.3 10*3/uL (ref 4.0–10.5)
nRBC: 0 % (ref 0.0–0.2)

## 2021-02-01 LAB — I-STAT BETA HCG BLOOD, ED (MC, WL, AP ONLY): I-stat hCG, quantitative: <5 m[IU]/mL (ref <5)

## 2021-02-01 LAB — RESP PANEL BY RT-PCR (FLU A&B, COVID) ARPGX2
Influenza A by PCR: NEGATIVE
Influenza B by PCR: NEGATIVE
SARS Coronavirus 2 by RT PCR: NEGATIVE

## 2021-02-01 MED ORDER — HYDROCODONE-ACETAMINOPHEN 5-325 MG PO TABS
1.0000 | ORAL_TABLET | ORAL | Status: DC | PRN
  Administered 2021-02-01 – 2021-02-02 (×3): 2 via ORAL
  Filled 2021-02-01 (×3): qty 2

## 2021-02-01 MED ORDER — IOHEXOL 300 MG/ML  SOLN
100.0000 mL | Freq: Once | INTRAMUSCULAR | Status: AC | PRN
  Administered 2021-02-01: 100 mL via INTRAVENOUS

## 2021-02-01 MED ORDER — SODIUM CHLORIDE 0.9 % IV SOLN
2.0000 g | Freq: Once | INTRAVENOUS | Status: AC
  Administered 2021-02-01: 2 g via INTRAVENOUS
  Filled 2021-02-01: qty 2

## 2021-02-01 MED ORDER — ACETAMINOPHEN 325 MG PO TABS
650.0000 mg | ORAL_TABLET | Freq: Once | ORAL | Status: AC
  Administered 2021-02-01: 650 mg via ORAL
  Filled 2021-02-01: qty 2

## 2021-02-01 MED ORDER — POLYETHYLENE GLYCOL 3350 17 G PO PACK: 17.0000 g | PACK | Freq: Every day | ORAL | Status: DC | PRN

## 2021-02-01 MED ORDER — ONDANSETRON HCL 4 MG/2ML IJ SOLN
4.0000 mg | Freq: Once | INTRAMUSCULAR | Status: AC
  Administered 2021-02-01: 4 mg via INTRAVENOUS
  Filled 2021-02-01: qty 2

## 2021-02-01 MED ORDER — DOCUSATE SODIUM 100 MG PO CAPS
100.0000 mg | ORAL_CAPSULE | Freq: Two times a day (BID) | ORAL | Status: DC
  Administered 2021-02-01 – 2021-02-05 (×8): 100 mg via ORAL
  Filled 2021-02-01 (×8): qty 1

## 2021-02-01 MED ORDER — ONDANSETRON HCL 4 MG/2ML IJ SOLN: 4.0000 mg | Freq: Four times a day (QID) | INTRAMUSCULAR | Status: DC | PRN

## 2021-02-01 MED ORDER — ACETAMINOPHEN 325 MG PO TABS
650.0000 mg | ORAL_TABLET | Freq: Four times a day (QID) | ORAL | Status: DC | PRN
  Administered 2021-02-01 – 2021-02-04 (×4): 650 mg via ORAL
  Filled 2021-02-01 (×4): qty 2

## 2021-02-01 MED ORDER — HYDROMORPHONE HCL 1 MG/ML IJ SOLN
1.0000 mg | Freq: Once | INTRAMUSCULAR | Status: AC
  Administered 2021-02-01: 1 mg via INTRAVENOUS
  Filled 2021-02-01: qty 1

## 2021-02-01 MED ORDER — MORPHINE SULFATE (PF) 4 MG/ML IV SOLN
4.0000 mg | Freq: Once | INTRAVENOUS | Status: AC
  Administered 2021-02-01: 4 mg via INTRAVENOUS
  Filled 2021-02-01: qty 1

## 2021-02-01 MED ORDER — SODIUM CHLORIDE 0.9 % IV BOLUS
1000.0000 mL | Freq: Once | INTRAVENOUS | Status: AC
  Administered 2021-02-01: 1000 mL via INTRAVENOUS

## 2021-02-01 MED ORDER — ACETAMINOPHEN 650 MG RE SUPP: 650.0000 mg | Freq: Four times a day (QID) | RECTAL | Status: DC | PRN

## 2021-02-01 MED ORDER — ONDANSETRON HCL 4 MG PO TABS: 4.0000 mg | ORAL_TABLET | Freq: Four times a day (QID) | ORAL | Status: DC | PRN

## 2021-02-01 MED ORDER — CEFAZOLIN SODIUM-DEXTROSE 1-4 GM/50ML-% IV SOLN
1.0000 g | Freq: Three times a day (TID) | INTRAVENOUS | Status: DC
  Administered 2021-02-01 – 2021-02-02 (×3): 1 g via INTRAVENOUS
  Filled 2021-02-01 (×3): qty 50

## 2021-02-01 MED ORDER — FERROUS SULFATE 325 (65 FE) MG PO TABS
325.0000 mg | ORAL_TABLET | Freq: Two times a day (BID) | ORAL | Status: DC
  Administered 2021-02-01 – 2021-02-05 (×8): 325 mg via ORAL
  Filled 2021-02-01 (×8): qty 1

## 2021-02-01 MED ORDER — HYDRALAZINE HCL 20 MG/ML IJ SOLN: 5.0000 mg | INTRAMUSCULAR | Status: DC | PRN

## 2021-02-01 MED ORDER — LACTATED RINGERS IV SOLN: INTRAVENOUS | Status: DC

## 2021-02-01 MED ORDER — BISACODYL 5 MG PO TBEC: 5.0000 mg | DELAYED_RELEASE_TABLET | Freq: Every day | ORAL | Status: DC | PRN

## 2021-02-01 MED ORDER — MORPHINE SULFATE (PF) 2 MG/ML IV SOLN
2.0000 mg | INTRAVENOUS | Status: DC | PRN
  Administered 2021-02-02 – 2021-02-05 (×10): 2 mg via INTRAVENOUS
  Filled 2021-02-01 (×10): qty 1

## 2021-02-01 MED ORDER — VANCOMYCIN HCL 2000 MG/400ML IV SOLN
2000.0000 mg | Freq: Once | INTRAVENOUS | Status: AC
  Administered 2021-02-01: 2000 mg via INTRAVENOUS
  Filled 2021-02-01: qty 400

## 2021-02-01 MED ORDER — IBUPROFEN 200 MG PO TABS
600.0000 mg | ORAL_TABLET | Freq: Four times a day (QID) | ORAL | Status: DC | PRN
  Administered 2021-02-01: 600 mg via ORAL
  Filled 2021-02-01: qty 3

## 2021-02-01 NOTE — H&P (Signed)
History and Physical    Caitlin Maddox AJG:[id number] DOB: 1980/11/22 DOA: 02/01/2021  PCP: Patient, No Pcp Per (Inactive) Consultants:  None Patient coming from:  Home - lives with kids; NOK: Mother, [phone]  Chief Complaint: Buttocks abscess  HPI: Caitlin Maddox is a 40 y.o. female without significant medical history presenting with buttocks abscess. She thought she had an abscess or bug bite on the back of her leg.  It has been there about a week but it blew up in size 2 days ago.  Has not noticed fever at home, maybe last night.  She hit it and it drained a little yesterday.  She denies intentionally trying to express fluid from it.  She was previously hospitalized in May for symptomatic anemia.  At that time her Hgb was 5.2 and she was transfused 4 units PRBC.  Her periods have been ok recently.  No more issues with anemia.  She is taking iron.     ED Course: Cellulitis of R buttock, thigh.  +fevers.  CT without abscess.  Ongoing pain, likely needs IV abx, does not look septic.  Review of Systems: As per HPI; otherwise review of systems reviewed and negative.   Ambulatory Status:  Ambulates without assistance  COVID Vaccine Status:   Complete plus booster  Past Medical History:  Diagnosis Date   Dysfunctional uterine bleeding    Symptomatic anemia     Past Surgical History:  Procedure Laterality Date   CESAREAN SECTION      Social History   Socioeconomic History   Marital status: Single    Spouse name: Not on file   Number of children: Not on file   Years of education: Not on file   Highest education level: Not on file  Occupational History   Occupation: CNA  Tobacco Use   Smoking status: Former    Packs/day: 0.50    Types: Cigarettes    Quit date: 10/09/2020    Years since quitting: 0.3   Smokeless tobacco: Never  Substance and Sexual Activity   Alcohol use: No   Drug use: No   Sexual activity: Not on file  Other Topics Concern   Not on file  Social  History Narrative   Not on file   Social Determinants of Health   Financial Resource Strain: Not on file  Food Insecurity: Not on file  Transportation Needs: Not on file  Physical Activity: Not on file  Stress: Not on file  Social Connections: Not on file  Intimate Partner Violence: Not on file    Allergies  Allergen Reactions   Clindamycin/Lincomycin Anaphylaxis and Swelling    History reviewed. No pertinent family history.  Prior to Admission medications   Medication Sig Start Date End Date Taking? Authorizing Provider  acetaminophen (TYLENOL) 500 MG tablet Take 500-1,000 mg by mouth every 6 (six) hours as needed for mild pain or headache.   Yes [provider]  ibuprofen (ADVIL) 200 MG tablet Take 600 mg by mouth every 6 (six) hours as needed for mild pain, headache or cramping.   Yes [provider]  megestrol (MEGACE) 40 MG tablet Take 1 tablet (40 mg total) by mouth 2 (two) times daily. Can increase to two tablets twice a day in the event of heavy bleeding Patient taking differently: Take 40 mg by mouth 2 (two) times daily as needed (for bleeding and may increase to 80 mg two times a day in the event of heavy bleeding). 10/05/20  Yes Constant, Vickii Chafe, MD  cetirizine (ZYRTEC) 10 MG tablet Take 10 mg by mouth daily as needed for allergies. Patient not taking: Reported on 02/01/2021    [provider]  ferrous sulfate (FERROUSUL) 325 (65 FE) MG tablet Take 1 tablet (325 mg total) by mouth 2 (two) times daily. Patient not taking: No sig reported 10/05/20   Constant, Peggy, MD  ibuprofen (ADVIL) 600 MG tablet Take 1 tablet (600 mg total) by mouth every 6 (six) hours as needed (mild pain). Patient not taking: No sig reported 01/28/21   Odis Luster, FNP    Physical Exam: Vitals:   02/01/21 1445 02/01/21 1545 02/01/21 1615 02/01/21 1628  BP: 111/73 123/70 123/65   Pulse: 82 88 93   Resp: (!) 8 16 13    Temp:    (!) 101 F (38.3 C)  TempSrc:       SpO2: 100% 99% 100%      General:  Appears calm and comfortable and is in NAD; diffusely warm to touch Eyes:  PERRL, EOMI, normal lids, iris ENT:  grossly normal hearing, lips & tongue, mmm; poor dentition Neck:  no LAD, masses or thyromegaly Cardiovascular:  RRR, no m/r/g. No LE edema.  Respiratory:   CTA bilaterally with no wheezes/rales/rhonchi.  Normal respiratory effort. Abdomen:  soft, NT, ND Skin:  R lower buttock/upper thigh with a 51mm ulceration that is draining serous and mildly purulent fluid with surrounding fluctuance and then a larger region of surrounding erythema.  It is quite TTP.      Musculoskeletal:  grossly normal tone BUE/BLE, good ROM, no bony abnormality Psychiatric:  grossly normal mood and affect, speech fluent and appropriate, AOx3 Neurologic:  CN 2-12 grossly intact, moves all extremities in coordinated fashion    Radiological Exams on Admission: Independently reviewed - see discussion in A/P where applicable  CT PELVIS W CONTRAST  Result Date: 02/01/2021 CLINICAL DATA:  Soft tissue infection suspected, thigh, xray done proximal posterior abscess; Soft tissue infection suspected, pelvis, no prior imaging EXAM: CT PELVIS WITH CONTRAST CT OF THE LOWER RIGHT EXTREMITY WITH CONTRAST TECHNIQUE: Multidetector CT imaging of the pelvis and lower right extremity was performed according to the standard protocol following intravenous contrast administration. CONTRAST:  113mL OMNIPAQUE IOHEXOL 300 MG/ML  SOLN COMPARISON:  None. FINDINGS: Bones/Joint/Cartilage No acute fracture or dislocation. No pelvic diastasis. Mild sclerotic changes associated with the mid right sacroiliac joint without associated erosion, likely degenerative. Hip joint spaces are maintained. No hip joint effusion is seen. Right femur intact and unremarkable. No erosion or periosteal elevation. Ligaments Suboptimally assessed by CT. Muscles and Tendons No acute musculotendinous abnormality of the  pelvis or right thigh. No intramuscular fluid collection. Soft tissues Prominent induration within the posterior subcutaneous soft tissues of the proximal right thigh overlying and just below the inferior right gluteus maximus muscle. There is overlying skin thickening. No well-defined or rim enhancing fluid collection. No soft tissue gas. No soft tissue ulceration or defect is evident. Soft tissues of the pelvis and right thigh appear otherwise unremarkable. Mildly prominent right inguinal lymph nodes are likely reactive. Enlarged leiomyomatous uterus with multiple fibroids which measure approximately 4.8 cm at the anterior uterine body and a 3.7 cm fibroid along the posterior uterine body which appears partially submucosal. IUD is present. No acute findings are seen within the pelvis. IMPRESSION: 1. Prominent induration within the posterior subcutaneous soft tissues of the proximal right thigh overlying and just below the inferior right gluteus maximus muscle. There is overlying skin thickening. No well-defined  or rim enhancing fluid collection. No soft tissue gas. Findings are most compatible with cellulitis. 2. Mildly prominent right inguinal lymph nodes, likely reactive. 3. No acute osseous abnormality of the pelvis or right femur. 4. Enlarged leiomyomatous uterus. Electronically Signed   By: Davina Poke D.O.   On: 02/01/2021 14:28   CT FEMUR RIGHT W CONTRAST  Result Date: 02/01/2021 CLINICAL DATA:  Soft tissue infection suspected, thigh, xray done proximal posterior abscess; Soft tissue infection suspected, pelvis, no prior imaging EXAM: CT PELVIS WITH CONTRAST CT OF THE LOWER RIGHT EXTREMITY WITH CONTRAST TECHNIQUE: Multidetector CT imaging of the pelvis and lower right extremity was performed according to the standard protocol following intravenous contrast administration. CONTRAST:  171mL OMNIPAQUE IOHEXOL 300 MG/ML  SOLN COMPARISON:  None. FINDINGS: Bones/Joint/Cartilage No acute fracture or  dislocation. No pelvic diastasis. Mild sclerotic changes associated with the mid right sacroiliac joint without associated erosion, likely degenerative. Hip joint spaces are maintained. No hip joint effusion is seen. Right femur intact and unremarkable. No erosion or periosteal elevation. Ligaments Suboptimally assessed by CT. Muscles and Tendons No acute musculotendinous abnormality of the pelvis or right thigh. No intramuscular fluid collection. Soft tissues Prominent induration within the posterior subcutaneous soft tissues of the proximal right thigh overlying and just below the inferior right gluteus maximus muscle. There is overlying skin thickening. No well-defined or rim enhancing fluid collection. No soft tissue gas. No soft tissue ulceration or defect is evident. Soft tissues of the pelvis and right thigh appear otherwise unremarkable. Mildly prominent right inguinal lymph nodes are likely reactive. Enlarged leiomyomatous uterus with multiple fibroids which measure approximately 4.8 cm at the anterior uterine body and a 3.7 cm fibroid along the posterior uterine body which appears partially submucosal. IUD is present. No acute findings are seen within the pelvis. IMPRESSION: 1. Prominent induration within the posterior subcutaneous soft tissues of the proximal right thigh overlying and just below the inferior right gluteus maximus muscle. There is overlying skin thickening. No well-defined or rim enhancing fluid collection. No soft tissue gas. Findings are most compatible with cellulitis. 2. Mildly prominent right inguinal lymph nodes, likely reactive. 3. No acute osseous abnormality of the pelvis or right femur. 4. Enlarged leiomyomatous uterus. Electronically Signed   By: Davina Poke D.O.   On: 02/01/2021 14:28    EKG: not done   Labs on Admission: I have personally reviewed the available labs and imaging studies at the time of the admission.  Pertinent labs:   Unremarkable BMP WBC  10.3 Hgb 7.3; 7.6 on 5/28 HCG <5   Assessment/Plan Principal Problem:   Cellulitis and abscess of buttock Active Problems:   Dysfunctional uterine bleeding   Cellulitis -Patient presenting with R upper thigh/lower buttocks infection -Currently with erythema to R upper thigh and buttocks -Normal WBC count, +fever without current concern for sepsis -She was given Vanc and Cefepime in the ER -By the cellulitis order set, this would actually be considered moderate non-purulent cellulitis and so Ancef monotherapy is appropriate -CT does not show apparent abscess, but if not improving she likely needs consideration of drainage -If improved tomorrow, she likely can be discharged; if not, she will likely need broadening of her antibiotics again   DUB -Significant fibroids on CT -Needs PCP/GYN f/u -Anemia is low but seemingly stable -Resume FeSO4 -Will recheck CBC in AM     Note: This patient has been tested and is negative for the novel coronavirus COVID-19. The patient has been fully vaccinated against COVID-19.  Level of care: Med-Surg DVT prophylaxis: SCDs Code Status:  Full - confirmed with patient Family Communication: None present; I spoke with the patient's mother by telephone at the time of admission while in the room with the patient. Disposition Plan:  The patient is from: home  Anticipated d/c is to: home without [facility] services   Anticipated d/c date will depend on clinical response to treatment, but possibly as early as tomorrow if she has excellent response to treatment  Patient is currently: acutely ill Consults called: None  Admission status:  It is my clinical opinion that referral for OBSERVATION is reasonable and necessary in this patient based on the above information provided. The aforementioned taken together are felt to place the patient at high risk for further clinical deterioration. However it is anticipated that the patient may be medically stable for  discharge from the hospital within 24 to 48 hours.    Karmen Bongo MD [facility]   How to contact the [facility] Attending or Consulting provider [place] or covering provider during after hours [employer], for this patient?  Check the care team in [facility] and look for a) attending/consulting [facility] provider listed and b) the [facility] team listed Log into [email or web address] and use [place]'s universal password to access. If you do not have the password, please contact the hospital operator. Locate the [facility] provider you are looking for under [facility] and page to a number that you can be directly reached. If you still have difficulty reaching the provider, please page the [facility] (Director on Call) for the Hospitalists listed on amion for assistance.   02/01/2021, 4:34 PM

## 2021-02-01 NOTE — ED Provider Notes (Signed)
[facility] EMERGENCY DEPARTMENT Provider Note   CSN: [id number] Arrival date & time: 02/01/21  0827     History Abscess   Caitlin Maddox is a 40 y.o. female here for possible abscess.  She stated 1 week ago there is small area the size of a quarter which she thought was a bug bite.  She did not feel or see a bug.  No history of IV drug use.  She noted the area gradually has increased since had worsening pain.  Associated fevers.  Minimal improvement with Tylenol last night for pain.  No prior history of abscesses.  No pain with bowel movements, rectal bleeding.  History of IBD.  No abdominal pain, rectal pain, GU or pelvic complaints.  No paresthesias, weakness, known sick contacts.  No known traumatic injuries.  Denies additional aggravating or alleviating factors.  Rates pain 9/10.   History obtained from patient and past medical records.  No interpreter used.  HPI     History reviewed. No pertinent past medical history.  Patient Active Problem List   Diagnosis Date Noted   Symptomatic anemia 10/04/2020    Past Surgical History:  Procedure Laterality Date   CESAREAN SECTION       OB History   No obstetric history on file.     No family history on file.  Social History   Tobacco Use   Smoking status: Former    Packs/day: 0.50    Types: Cigarettes    Quit date: 10/09/2020    Years since quitting: 0.3   Smokeless tobacco: Never  Substance Use Topics   Alcohol use: No   Drug use: No    Home Medications Prior to Admission medications   Medication Sig Start Date End Date Taking? Authorizing Provider  acetaminophen (TYLENOL) 500 MG tablet Take 500-1,000 mg by mouth every 6 (six) hours as needed for mild pain or headache.   Yes [provider]  ibuprofen (ADVIL) 200 MG tablet Take 600 mg by mouth every 6 (six) hours as needed for mild pain, headache or cramping.   Yes [provider]  megestrol (MEGACE) 40 MG tablet Take 1 tablet  (40 mg total) by mouth 2 (two) times daily. Can increase to two tablets twice a day in the event of heavy bleeding Patient taking differently: Take 40 mg by mouth 2 (two) times daily as needed (for bleeding and may increase to 80 mg two times a day in the event of heavy bleeding). 10/05/20  Yes Constant, Peggy, MD  cetirizine (ZYRTEC) 10 MG tablet Take 10 mg by mouth daily as needed for allergies. Patient not taking: Reported on 02/01/2021    [provider]  ferrous sulfate (FERROUSUL) 325 (65 FE) MG tablet Take 1 tablet (325 mg total) by mouth 2 (two) times daily. Patient not taking: No sig reported 10/05/20   Constant, Peggy, MD  ibuprofen (ADVIL) 600 MG tablet Take 1 tablet (600 mg total) by mouth every 6 (six) hours as needed (mild pain). Patient not taking: No sig reported 01/28/21   Odis Luster, FNP    Allergies    Clindamycin/lincomycin  Review of Systems   Review of Systems  Constitutional:  Positive for fever. Negative for activity change, appetite change, chills, diaphoresis, fatigue and unexpected weight change.  HENT: Negative.    Respiratory: Negative.    Cardiovascular: Negative.   Gastrointestinal: Negative.   Genitourinary: Negative.   Skin:  Positive for rash.  Neurological: Negative.   All other  systems reviewed and are negative.  Physical Exam Updated Vital Signs BP 111/73   Pulse 82   Temp 99.7 F (37.6 C) (Oral)   Resp (!) 8   LMP 01/20/2021 (Exact Date)   SpO2 100%   Physical Exam Vitals and nursing note reviewed.  Constitutional:      General: She is not in acute distress.    Appearance: She is well-developed. She is not ill-appearing, toxic-appearing or diaphoretic.  HENT:     Head: Normocephalic and atraumatic.     Nose: Nose normal.     Mouth/Throat:     Mouth: Mucous membranes are moist.  Eyes:     Pupils: Pupils are equal, round, and reactive to light.  Cardiovascular:     Rate and Rhythm: Normal rate.     Pulses: Normal pulses.      Heart sounds: Normal heart sounds.  Pulmonary:     Effort: Pulmonary effort is normal. No respiratory distress.     Breath sounds: Normal breath sounds.  Abdominal:     General: Bowel sounds are normal. There is no distension.     Palpations: Abdomen is soft.  Musculoskeletal:        General: Normal range of motion.     Cervical back: Normal range of motion.       Legs:     Comments: No bony tenderness.  Full range of motion of joints without difficulty  Skin:    General: Skin is warm and dry.     Capillary Refill: Capillary refill takes less than 2 seconds.     Comments: Induration, erythema, warmth to right inferior gluteal region, proximal posterior femur.  Punctate area possible fluctuance.  Neurological:     General: No focal deficit present.     Mental Status: She is alert.  Psychiatric:        Mood and Affect: Mood normal.    ED Results / Procedures / Treatments   Labs (all labs ordered are listed, but only abnormal results are displayed) Labs Reviewed  BASIC METABOLIC PANEL - Abnormal; Notable for the following components:      Result Value   BUN <5 (*)    All other components within normal limits  CBC WITH DIFFERENTIAL/PLATELET - Abnormal; Notable for the following components:   RBC 3.47 (*)    Hemoglobin 7.3 (*)    HCT 26.3 (*)    MCV 75.8 (*)    MCH 21.0 (*)    MCHC 27.8 (*)    RDW 17.4 (*)    Monocytes Absolute 1.2 (*)    All other components within normal limits  RESP PANEL BY RT-PCR (FLU A&B, COVID) ARPGX2  I-STAT BETA HCG BLOOD, ED ([facility], [facility], [facility] ONLY)    EKG None  Radiology CT PELVIS W CONTRAST  Result Date: 02/01/2021 CLINICAL DATA:  Soft tissue infection suspected, thigh, xray done proximal posterior abscess; Soft tissue infection suspected, pelvis, no prior imaging EXAM: CT PELVIS WITH CONTRAST CT OF THE LOWER RIGHT EXTREMITY WITH CONTRAST TECHNIQUE: Multidetector CT imaging of the pelvis and lower right extremity was performed according to the  standard protocol following intravenous contrast administration. CONTRAST:  170mL OMNIPAQUE IOHEXOL 300 MG/ML  SOLN COMPARISON:  None. FINDINGS: Bones/Joint/Cartilage No acute fracture or dislocation. No pelvic diastasis. Mild sclerotic changes associated with the mid right sacroiliac joint without associated erosion, likely degenerative. Hip joint spaces are maintained. No hip joint effusion is seen. Right femur intact and unremarkable. No erosion or periosteal elevation. Ligaments Suboptimally  assessed by CT. Muscles and Tendons No acute musculotendinous abnormality of the pelvis or right thigh. No intramuscular fluid collection. Soft tissues Prominent induration within the posterior subcutaneous soft tissues of the proximal right thigh overlying and just below the inferior right gluteus maximus muscle. There is overlying skin thickening. No well-defined or rim enhancing fluid collection. No soft tissue gas. No soft tissue ulceration or defect is evident. Soft tissues of the pelvis and right thigh appear otherwise unremarkable. Mildly prominent right inguinal lymph nodes are likely reactive. Enlarged leiomyomatous uterus with multiple fibroids which measure approximately 4.8 cm at the anterior uterine body and a 3.7 cm fibroid along the posterior uterine body which appears partially submucosal. IUD is present. No acute findings are seen within the pelvis. IMPRESSION: 1. Prominent induration within the posterior subcutaneous soft tissues of the proximal right thigh overlying and just below the inferior right gluteus maximus muscle. There is overlying skin thickening. No well-defined or rim enhancing fluid collection. No soft tissue gas. Findings are most compatible with cellulitis. 2. Mildly prominent right inguinal lymph nodes, likely reactive. 3. No acute osseous abnormality of the pelvis or right femur. 4. Enlarged leiomyomatous uterus. Electronically Signed   By: Davina Poke D.O.   On: 02/01/2021 14:28    CT FEMUR RIGHT W CONTRAST  Result Date: 02/01/2021 CLINICAL DATA:  Soft tissue infection suspected, thigh, xray done proximal posterior abscess; Soft tissue infection suspected, pelvis, no prior imaging EXAM: CT PELVIS WITH CONTRAST CT OF THE LOWER RIGHT EXTREMITY WITH CONTRAST TECHNIQUE: Multidetector CT imaging of the pelvis and lower right extremity was performed according to the standard protocol following intravenous contrast administration. CONTRAST:  164mL OMNIPAQUE IOHEXOL 300 MG/ML  SOLN COMPARISON:  None. FINDINGS: Bones/Joint/Cartilage No acute fracture or dislocation. No pelvic diastasis. Mild sclerotic changes associated with the mid right sacroiliac joint without associated erosion, likely degenerative. Hip joint spaces are maintained. No hip joint effusion is seen. Right femur intact and unremarkable. No erosion or periosteal elevation. Ligaments Suboptimally assessed by CT. Muscles and Tendons No acute musculotendinous abnormality of the pelvis or right thigh. No intramuscular fluid collection. Soft tissues Prominent induration within the posterior subcutaneous soft tissues of the proximal right thigh overlying and just below the inferior right gluteus maximus muscle. There is overlying skin thickening. No well-defined or rim enhancing fluid collection. No soft tissue gas. No soft tissue ulceration or defect is evident. Soft tissues of the pelvis and right thigh appear otherwise unremarkable. Mildly prominent right inguinal lymph nodes are likely reactive. Enlarged leiomyomatous uterus with multiple fibroids which measure approximately 4.8 cm at the anterior uterine body and a 3.7 cm fibroid along the posterior uterine body which appears partially submucosal. IUD is present. No acute findings are seen within the pelvis. IMPRESSION: 1. Prominent induration within the posterior subcutaneous soft tissues of the proximal right thigh overlying and just below the inferior right gluteus maximus  muscle. There is overlying skin thickening. No well-defined or rim enhancing fluid collection. No soft tissue gas. Findings are most compatible with cellulitis. 2. Mildly prominent right inguinal lymph nodes, likely reactive. 3. No acute osseous abnormality of the pelvis or right femur. 4. Enlarged leiomyomatous uterus. Electronically Signed   By: Davina Poke D.O.   On: 02/01/2021 14:28    Procedures Procedures   Medications Ordered in ED Medications  vancomycin (VANCOREADY) IVPB 2000 mg/400 mL (2,000 mg Intravenous New Bag/Given 02/01/21 1533)  acetaminophen (TYLENOL) tablet 650 mg (650 mg Oral Given 02/01/21 0855)  ondansetron (ZOFRAN)  injection 4 mg (4 mg Intravenous Given 02/01/21 1316)  sodium chloride 0.9 % bolus 1,000 mL (0 mLs Intravenous Stopped 02/01/21 1515)  morphine 4 MG/ML injection 4 mg (4 mg Intravenous Given 02/01/21 1316)  HYDROmorphone (DILAUDID) injection 1 mg (1 mg Intravenous Given 02/01/21 1419)  iohexol (OMNIPAQUE) 300 MG/ML solution 100 mL (100 mLs Intravenous Contrast Given 02/01/21 1416)  ceFEPIme (MAXIPIME) 2 g in sodium chloride 0.9 % 100 mL IVPB (0 g Intravenous Stopped 02/01/21 1515)   ED Course  I have reviewed the triage vital signs and the nursing notes.  Pertinent labs & imaging results that were available during my care of the patient were reviewed by me and considered in my medical decision making (see chart for details).  Here for evaluation of possible abscess.  On arrival she is febrile however no tachycardia, tachypnea or hypoxia.  Patient has a large area of induration, erythema, warmth to right inferior gluteal region/proximal posterior femur.  Does not seem to extend into perirectal or GU region.  Given Tylenol in triage.  Patient's not been able to control pain at home with Tylenol.  Work-up personally reviewed and interpreted:  CBC without leukocytosis, hemoglobin 7.3, similar to prior Metabolic panel without electrolyte, renal or liver  abnormality Pregnancy test negative CT pelvis W and CT femur W shows cellulitis right gluteal, proximal femur.  No definitive fluid collection.  No perirectal, GU involvement to suggest Fournier gangrene.  Patient reassessed.  Discussed labs and imaging.  She has had multiple rounds of pain medicine and is still in moderate pain.  Reassuring no abscess on imaging.  Given patient does have significant induration, erythema, warmth, tenderness will be started on IV antibiotics.  Will be admitted for pain control.  CONSULT with Dr. Lorin Mercy with [facility] who agrees to evaluate patient for admission.  The patient appears reasonably stabilized for admission considering the current resources, flow, and capabilities available in the ED at this time, and I doubt any other [facility] requiring further screening and/or treatment in the ED prior to admission.     MDM Rules/Calculators/A&P                            Final Clinical Impression(s) / ED Diagnoses Final diagnoses:  Cellulitis of buttock  Cellulitis of right lower extremity    Rx / DC Orders ED Discharge Orders     None        Nettie Elm, PA-C 02/01/21 1535    Valarie Merino, MD 02/01/21 1719

## 2021-02-01 NOTE — ED Triage Notes (Signed)
C/o large abscess to R buttocks since yesterday with fever and chills.

## 2021-02-01 NOTE — ED Provider Notes (Signed)
Emergency Medicine Provider Triage Evaluation Note  Caitlin Maddox , a 40 y.o. female  was evaluated in triage.  Pt complains of possible abscess.  She noticed a 1 week ago that there was a small area of irritation which she believes may have been because of a bug bite.  She noticed the area gradually worsening over the past few days to become more swollen, red and painful.  She reports associated fever and chills.  Minimal improvement noted in pain with Tylenol last taken last night.  She denies history of similar symptoms in the past.  Reports pain is to the point where she is having trouble sitting down in that area..  Review of Systems  Positive: Abscess, fever Negative: Cough, congestion, vomiting  Physical Exam  BP 127/80 (BP Location: Left Arm)   Pulse 94   Temp (!) 101.2 F (38.4 C) (Oral)   Resp 16   LMP 01/20/2021 (Exact Date)   SpO2 99%  Gen:   Awake, no distress   Resp:  Normal effort  MSK:   Moves extremities without difficulty  Other:  Large area of induration and erythema on the right lower buttock  Medical Decision Making  Medically screening exam initiated at 8:48 AM.  Appropriate orders placed.  Caitlin Maddox was informed that the remainder of the evaluation will be completed by another provider, this initial triage assessment does not replace that evaluation, and the importance of remaining in the ED until their evaluation is complete.  Imaging ordered to evaluate for depth given antipyretic and lab work ordered   Delia Heady, PA-C 02/01/21 [place], [place], DO 02/03/21 [phone]

## 2021-02-01 NOTE — Plan of Care (Signed)

## 2021-02-02 DIAGNOSIS — L0390 Cellulitis, unspecified: Secondary | ICD-10-CM | POA: Diagnosis present

## 2021-02-02 DIAGNOSIS — Z79899 Other long term (current) drug therapy: Secondary | ICD-10-CM | POA: Diagnosis not present

## 2021-02-02 DIAGNOSIS — D500 Iron deficiency anemia secondary to blood loss (chronic): Secondary | ICD-10-CM | POA: Diagnosis present

## 2021-02-02 DIAGNOSIS — L03115 Cellulitis of right lower limb: Secondary | ICD-10-CM | POA: Diagnosis present

## 2021-02-02 DIAGNOSIS — L539 Erythematous condition, unspecified: Secondary | ICD-10-CM | POA: Diagnosis present

## 2021-02-02 DIAGNOSIS — D259 Leiomyoma of uterus, unspecified: Secondary | ICD-10-CM | POA: Diagnosis present

## 2021-02-02 DIAGNOSIS — Z881 Allergy status to other antibiotic agents status: Secondary | ICD-10-CM | POA: Diagnosis not present

## 2021-02-02 DIAGNOSIS — N920 Excessive and frequent menstruation with regular cycle: Secondary | ICD-10-CM | POA: Diagnosis present

## 2021-02-02 DIAGNOSIS — N938 Other specified abnormal uterine and vaginal bleeding: Secondary | ICD-10-CM | POA: Diagnosis present

## 2021-02-02 DIAGNOSIS — Z20822 Contact with and (suspected) exposure to covid-19: Secondary | ICD-10-CM | POA: Diagnosis present

## 2021-02-02 DIAGNOSIS — L0231 Cutaneous abscess of buttock: Secondary | ICD-10-CM | POA: Diagnosis present

## 2021-02-02 DIAGNOSIS — L03317 Cellulitis of buttock: Secondary | ICD-10-CM | POA: Diagnosis present

## 2021-02-02 LAB — CBC
HCT: 21.8 % — ABNORMAL LOW (ref 36.0–46.0)
Hemoglobin: 6.5 g/dL — CL (ref 12.0–15.0)
MCH: 22 pg — ABNORMAL LOW (ref 26.0–34.0)
MCHC: 29.8 g/dL — ABNORMAL LOW (ref 30.0–36.0)
MCV: 73.6 fL — ABNORMAL LOW (ref 80.0–100.0)
Platelets: 181 10*3/uL (ref 150–400)
RBC: 2.96 MIL/uL — ABNORMAL LOW (ref 3.87–5.11)
RDW: 17.4 % — ABNORMAL HIGH (ref 11.5–15.5)
WBC: 8.4 10*3/uL (ref 4.0–10.5)
nRBC: 0 % (ref 0.0–0.2)

## 2021-02-02 LAB — PREPARE RBC (CROSSMATCH)

## 2021-02-02 LAB — BASIC METABOLIC PANEL
Anion gap: 7 (ref 5–15)
BUN: 6 mg/dL (ref 6–20)
CO2: 24 mmol/L (ref 22–32)
Calcium: 8.6 mg/dL — ABNORMAL LOW (ref 8.9–10.3)
Chloride: 106 mmol/L (ref 98–111)
Creatinine, Ser: 0.7 mg/dL (ref 0.44–1.00)
GFR, Estimated: >60 mL/min (ref >60)
Glucose, Bld: 100 mg/dL — ABNORMAL HIGH (ref 70–99)
Potassium: 3.7 mmol/L (ref 3.5–5.1)
Sodium: 137 mmol/L (ref 135–145)

## 2021-02-02 LAB — HEMOGLOBIN AND HEMATOCRIT, BLOOD
HCT: 24 % — ABNORMAL LOW (ref 36.0–46.0)
Hemoglobin: 7.3 g/dL — ABNORMAL LOW (ref 12.0–15.0)

## 2021-02-02 LAB — HIV ANTIBODY (ROUTINE TESTING W REFLEX): HIV Screen 4th Generation wRfx: NONREACTIVE

## 2021-02-02 MED ORDER — OXYCODONE HCL 5 MG PO TABS
5.0000 mg | ORAL_TABLET | ORAL | Status: DC | PRN
  Administered 2021-02-02 – 2021-02-05 (×10): 5 mg via ORAL
  Filled 2021-02-02 (×10): qty 1

## 2021-02-02 MED ORDER — ENOXAPARIN SODIUM 40 MG/0.4ML IJ SOSY
40.0000 mg | PREFILLED_SYRINGE | INTRAMUSCULAR | Status: DC
  Administered 2021-02-02 – 2021-02-04 (×3): 40 mg via SUBCUTANEOUS
  Filled 2021-02-02 (×3): qty 0.4

## 2021-02-02 MED ORDER — VANCOMYCIN HCL 1750 MG/350ML IV SOLN
1750.0000 mg | Freq: Two times a day (BID) | INTRAVENOUS | Status: DC
  Administered 2021-02-02 – 2021-02-03 (×2): 1750 mg via INTRAVENOUS
  Filled 2021-02-02 (×3): qty 350

## 2021-02-02 MED ORDER — SODIUM CHLORIDE 0.9 % IV SOLN
2.0000 g | Freq: Three times a day (TID) | INTRAVENOUS | Status: DC
  Administered 2021-02-02 – 2021-02-05 (×10): 2 g via INTRAVENOUS
  Filled 2021-02-02 (×10): qty 2

## 2021-02-02 MED ORDER — SODIUM CHLORIDE 0.9% IV SOLUTION: Freq: Once | INTRAVENOUS | Status: AC

## 2021-02-02 NOTE — Progress Notes (Signed)
HGB6.5 notified on call for [facility]-

## 2021-02-02 NOTE — Progress Notes (Signed)
Pharmacy Antibiotic Note  Caitlin Maddox is a 40 y.o. female admitted on 02/01/2021 with cellulitis.  Pharmacy has been consulted for vancomycin and cefepime dosing. 40 y.o. female without significant medical history presented with complains of pain in the back of her thigh.  She felt a bump in that area. CT scan show evidence for abscess. WBC normal and current Scr 0.7. Patient received vancomycin loading dose of 2000 MG in the ED and Cefepime. Patient Tmax 101.6 and requires broad spectrum abx for now.   Plan: Vancomycin 1750 MG IV Q12H (eAUC 465, Scr 0.7, Wt 97 KG)  Cefepime 2G IV Q8H  Monitor renal function, clinical status, and treatment plan      Temp (24hrs), Avg:99.6 F (37.6 C), Min:98.2 F (36.8 C), Max:101.6 F (38.7 C)  Recent Labs  Lab 02/01/21 0853 02/02/21 0453  WBC 10.3 8.4  CREATININE 0.83 0.70    CrCl cannot be calculated (Unknown ideal weight.).    Allergies  Allergen Reactions   Clindamycin/Lincomycin Anaphylaxis and Swelling    Antimicrobials this admission: 9/24 Vancomycin x1  9/24 Cefepime x1  9/25 Vancomycin >>  9/25 Cefepime >>  Dose adjustments this admission:  Microbiology results:  Thank you for allowing pharmacy to be a part of this patient's care.  Cephus Slater, PharmD, [facility] Resident [phone] 02/02/2021 1:31 PM

## 2021-02-02 NOTE — Plan of Care (Signed)

## 2021-02-02 NOTE — Progress Notes (Signed)
[facility] PROGRESS NOTE   Caitlin Maddox FMB:[id number] DOB: Oct 08, 1980 DOA: 02/01/2021  PCP: Patient, No Pcp Per (Inactive)  Brief History/Interval Summary: 40 y.o. female without significant medical history presented with complains of pain in the back of her thigh.  She felt a bump in that area.  Presented due to worsening pain.  Hospitalized in May for symptomatic anemia.  At that time hemoglobin was 5.2 and she was transfused 4 units of PRBC.  CT scan showed evidence for cellulitis in the right posterior thigh without any evidence for abscess.  Consultants: None  Procedures: None    Subjective/Interval History: Patient complains of 9 out of 10 pain in the back of her right thigh.  Having some fever and chills.  No nausea vomiting.  Her current cycle 4 menstrual period ended yesterday.  Has been heavy for about a year or so.    Assessment/Plan:  Cellulitis involving right posterior thigh CT scan did not show any evidence for abscess.  WBC was normal.  No evidence for sepsis at the time of admission.  Patient was initially given vancomycin and cefepime in the ED.  She was subsequently placed on cefazolin.  Patient continues to be febrile.  Continues to have redness and significant pain.  We will broaden her antibiotic spectrum to vancomycin and cefepime again.  Follow-up on cultures.  No indication at this time to involve surgical specialty.  Anemia due to chronic blood loss Hemoglobin had dropped to 6.5 today.  This is all likely due to her menorrhagia from fibroid.  Will be transfused 1 unit of PRBC today.  Continue iron supplementation.  Menorrhagia secondary to fibroids Patient told that she will need to follow-up with GYN for this issue.  She will likely require surgical intervention.  She understands.  No active bleeding is present currently.    DVT Prophylaxis: Lovenox will be initiated Code Status: Full code Family Communication: Discussed with the  patient Disposition Plan: Hopefully return home in improved  Status is: Observation  The patient will require care spanning > 2 midnights and should be moved to inpatient because: IV treatments appropriate due to intensity of illness or inability to take PO and Inpatient level of care appropriate due to severity of illness  Dispo: The patient is from: Home              Anticipated d/c is to: Home              Patient currently is not medically stable to d/c.   Difficult to place patient No      Medications: Scheduled:  sodium chloride   Intravenous Once   docusate sodium  100 mg Oral BID   ferrous sulfate  325 mg Oral BID   Continuous:  ceFEPime (MAXIPIME) IV     lactated ringers 75 mL/hr at 02/02/21 7017   BLT:[id number] **OR** acetaminophen, bisacodyl, hydrALAZINE, ibuprofen, morphine injection, ondansetron **OR** ondansetron (ZOFRAN) IV, oxyCODONE, polyethylene glycol  Antibiotics: Anti-infectives (From admission, onward)    Start     Dose/Rate Route Frequency Ordered Stop   02/02/21 1400  ceFEPIme (MAXIPIME) 2 g in sodium chloride 0.9 % 100 mL IVPB        2 g 200 mL/hr over 30 Minutes Intravenous Every 8 hours 02/02/21 1151     02/01/21 1728  ceFAZolin (ANCEF) IVPB 1 g/50 mL premix  Status:  Discontinued        1 g 100 mL/hr over 30 Minutes Intravenous Every 8 hours 02/01/21  1621 02/02/21 1015   02/01/21 1445  vancomycin (VANCOREADY) IVPB 2000 mg/400 mL        2,000 mg 200 mL/hr over 120 Minutes Intravenous  Once 02/01/21 1435 02/01/21 1733   02/01/21 1445  ceFEPIme (MAXIPIME) 2 g in sodium chloride 0.9 % 100 mL IVPB        2 g 200 mL/hr over 30 Minutes Intravenous  Once 02/01/21 1435 02/01/21 1515       Objective:  Vital Signs  Vitals:   02/01/21 1654 02/01/21 1939 02/02/21 0832 02/02/21 1132  BP: 115/61 112/61 115/73 132/75  Pulse: 86 87 81 83  Resp: 16 14 17 18   Temp: (!) 101.6 F (38.7 C) 98.2 F (36.8 C) 98.8 F (37.1 C) 98.8 F (37.1 C)   TempSrc: Oral Oral Oral Oral  SpO2: 100% 98% 100% 100%    Intake/Output Summary (Last 24 hours) at 02/02/2021 1211 Last data filed at 02/02/2021 1200 Gross per 24 hour  Intake 1464.74 ml  Output --  Net 1464.74 ml   There were no vitals filed for this visit.  General appearance: Awake alert.  In no distress Resp: Clear to auscultation bilaterally.  Normal effort Cardio: S1-S2 is normal regular.  No S3-S4.  No rubs murmurs or bruit GI: Abdomen is soft.  Nontender nondistended.  Bowel sounds are present normal.  No masses organomegaly Extremities: Erythema noted posterior right thigh.  Warm to touch.  Tender to palpate.  No fluctuation identified. Neurologic: Alert and oriented x3.  No focal neurological deficits.    Lab Results:  Data Reviewed: I have personally reviewed following labs and imaging studies  CBC: Recent Labs  Lab 02/01/21 0853 02/02/21 0453  WBC 10.3 8.4  NEUTROABS 7.5  --   HGB 7.3* 6.5*  HCT 26.3* 21.8*  MCV 75.8* 73.6*  PLT 234 203    Basic Metabolic Panel: Recent Labs  Lab 02/01/21 0853 02/02/21 0453  NA 135 137  K 4.0 3.7  CL 103 106  CO2 24 24  GLUCOSE 98 100*  BUN <5* 6  CREATININE 0.83 0.70  CALCIUM 9.5 8.6*    GFR: CrCl cannot be calculated (Unknown ideal weight.).    Recent Results (from the past 240 hour(s))  Resp Panel by RT-PCR (Flu A&B, Covid) Nasopharyngeal Swab     Status: None   Collection Time: 02/01/21  2:57 PM   Specimen: Nasopharyngeal Swab; Nasopharyngeal(NP) swabs in vial transport medium  Result Value Ref Range Status   SARS Coronavirus 2 by RT PCR NEGATIVE NEGATIVE Final    Comment: (NOTE) SARS-CoV-2 target nucleic acids are NOT DETECTED.  The SARS-CoV-2 RNA is generally detectable in upper respiratory specimens during the acute phase of infection. The lowest concentration of SARS-CoV-2 viral copies this assay can detect is 138 copies/mL. A negative result does not preclude SARS-Cov-2 infection and should  not be used as the sole basis for treatment or other patient management decisions. A negative result may occur with  improper specimen collection/handling, submission of specimen other than nasopharyngeal swab, presence of viral mutation(s) within the areas targeted by this assay, and inadequate number of viral copies(<138 copies/mL). A negative result must be combined with clinical observations, patient history, and epidemiological information. The expected result is Negative.  Fact Sheet for Patients:  EntrepreneurPulse.com.au  Fact Sheet for Healthcare Providers:  IncredibleEmployment.be  This test is no t yet approved or cleared by the [place] FDA and  has been authorized for detection and/or diagnosis of SARS-CoV-2 by FDA  under an Emergency Use Authorization (EUA). This EUA will remain  in effect (meaning this test can be used) for the duration of the COVID-19 declaration under Section 564(b)(1) of the Act, 21 U.S.C.section 360bbb-3(b)(1), unless the authorization is terminated  or revoked sooner.       Influenza A by PCR NEGATIVE NEGATIVE Final   Influenza B by PCR NEGATIVE NEGATIVE Final    Comment: (NOTE) The Xpert Xpress SARS-CoV-2/FLU/RSV plus assay is intended as an aid in the diagnosis of influenza from Nasopharyngeal swab specimens and should not be used as a sole basis for treatment. Nasal washings and aspirates are unacceptable for Xpert Xpress SARS-CoV-2/FLU/RSV testing.  Fact Sheet for Patients: EntrepreneurPulse.com.au  Fact Sheet for Healthcare Providers: IncredibleEmployment.be  This test is not yet approved or cleared by the [place] FDA and has been authorized for detection and/or diagnosis of SARS-CoV-2 by FDA under an Emergency Use Authorization (EUA). This EUA will remain in effect (meaning this test can be used) for the duration of the COVID-19 declaration under  Section 564(b)(1) of the Act, 21 U.S.C. section 360bbb-3(b)(1), unless the authorization is terminated or revoked.  Performed at [facility], [place] [address] [place], [place] [address]       Radiology Studies: CT PELVIS W CONTRAST  Result Date: 02/01/2021 CLINICAL DATA:  Soft tissue infection suspected, thigh, xray done proximal posterior abscess; Soft tissue infection suspected, pelvis, no prior imaging EXAM: CT PELVIS WITH CONTRAST CT OF THE LOWER RIGHT EXTREMITY WITH CONTRAST TECHNIQUE: Multidetector CT imaging of the pelvis and lower right extremity was performed according to the standard protocol following intravenous contrast administration. CONTRAST:  123mL OMNIPAQUE IOHEXOL 300 MG/ML  SOLN COMPARISON:  None. FINDINGS: Bones/Joint/Cartilage No acute fracture or dislocation. No pelvic diastasis. Mild sclerotic changes associated with the mid right sacroiliac joint without associated erosion, likely degenerative. Hip joint spaces are maintained. No hip joint effusion is seen. Right femur intact and unremarkable. No erosion or periosteal elevation. Ligaments Suboptimally assessed by CT. Muscles and Tendons No acute musculotendinous abnormality of the pelvis or right thigh. No intramuscular fluid collection. Soft tissues Prominent induration within the posterior subcutaneous soft tissues of the proximal right thigh overlying and just below the inferior right gluteus maximus muscle. There is overlying skin thickening. No well-defined or rim enhancing fluid collection. No soft tissue gas. No soft tissue ulceration or defect is evident. Soft tissues of the pelvis and right thigh appear otherwise unremarkable. Mildly prominent right inguinal lymph nodes are likely reactive. Enlarged leiomyomatous uterus with multiple fibroids which measure approximately 4.8 cm at the anterior uterine body and a 3.7 cm fibroid along the posterior uterine body which appears partially submucosal. IUD is present. No  acute findings are seen within the pelvis. IMPRESSION: 1. Prominent induration within the posterior subcutaneous soft tissues of the proximal right thigh overlying and just below the inferior right gluteus maximus muscle. There is overlying skin thickening. No well-defined or rim enhancing fluid collection. No soft tissue gas. Findings are most compatible with cellulitis. 2. Mildly prominent right inguinal lymph nodes, likely reactive. 3. No acute osseous abnormality of the pelvis or right femur. 4. Enlarged leiomyomatous uterus. Electronically Signed   By: Davina Poke D.O.   On: 02/01/2021 14:28   CT FEMUR RIGHT W CONTRAST  Result Date: 02/01/2021 CLINICAL DATA:  Soft tissue infection suspected, thigh, xray done proximal posterior abscess; Soft tissue infection suspected, pelvis, no prior imaging EXAM: CT PELVIS WITH CONTRAST CT OF THE LOWER RIGHT EXTREMITY  WITH CONTRAST TECHNIQUE: Multidetector CT imaging of the pelvis and lower right extremity was performed according to the standard protocol following intravenous contrast administration. CONTRAST:  135mL OMNIPAQUE IOHEXOL 300 MG/ML  SOLN COMPARISON:  None. FINDINGS: Bones/Joint/Cartilage No acute fracture or dislocation. No pelvic diastasis. Mild sclerotic changes associated with the mid right sacroiliac joint without associated erosion, likely degenerative. Hip joint spaces are maintained. No hip joint effusion is seen. Right femur intact and unremarkable. No erosion or periosteal elevation. Ligaments Suboptimally assessed by CT. Muscles and Tendons No acute musculotendinous abnormality of the pelvis or right thigh. No intramuscular fluid collection. Soft tissues Prominent induration within the posterior subcutaneous soft tissues of the proximal right thigh overlying and just below the inferior right gluteus maximus muscle. There is overlying skin thickening. No well-defined or rim enhancing fluid collection. No soft tissue gas. No soft tissue  ulceration or defect is evident. Soft tissues of the pelvis and right thigh appear otherwise unremarkable. Mildly prominent right inguinal lymph nodes are likely reactive. Enlarged leiomyomatous uterus with multiple fibroids which measure approximately 4.8 cm at the anterior uterine body and a 3.7 cm fibroid along the posterior uterine body which appears partially submucosal. IUD is present. No acute findings are seen within the pelvis. IMPRESSION: 1. Prominent induration within the posterior subcutaneous soft tissues of the proximal right thigh overlying and just below the inferior right gluteus maximus muscle. There is overlying skin thickening. No well-defined or rim enhancing fluid collection. No soft tissue gas. Findings are most compatible with cellulitis. 2. Mildly prominent right inguinal lymph nodes, likely reactive. 3. No acute osseous abnormality of the pelvis or right femur. 4. Enlarged leiomyomatous uterus. Electronically Signed   By: Davina Poke D.O.   On: 02/01/2021 14:28       LOS: 0 days   [facility] Pager on [email or web address]  02/02/2021, 12:11 PM

## 2021-02-03 LAB — BASIC METABOLIC PANEL
Anion gap: 8 (ref 5–15)
BUN: 5 mg/dL — ABNORMAL LOW (ref 6–20)
CO2: 24 mmol/L (ref 22–32)
Calcium: 8.6 mg/dL — ABNORMAL LOW (ref 8.9–10.3)
Chloride: 107 mmol/L (ref 98–111)
Creatinine, Ser: 0.68 mg/dL (ref 0.44–1.00)
GFR, Estimated: >60 mL/min (ref >60)
Glucose, Bld: 83 mg/dL (ref 70–99)
Potassium: 3.6 mmol/L (ref 3.5–5.1)
Sodium: 139 mmol/L (ref 135–145)

## 2021-02-03 LAB — TYPE AND SCREEN
ABO/RH(D): A POS
Antibody Screen: NEGATIVE
Unit division: 0

## 2021-02-03 LAB — BPAM RBC
Blood Product Expiration Date: 202210252359
ISSUE DATE / TIME: 202209251114
Unit Type and Rh: 6200

## 2021-02-03 LAB — CBC
HCT: 24 % — ABNORMAL LOW (ref 36.0–46.0)
Hemoglobin: 7.2 g/dL — ABNORMAL LOW (ref 12.0–15.0)
MCH: 22.6 pg — ABNORMAL LOW (ref 26.0–34.0)
MCHC: 30 g/dL (ref 30.0–36.0)
MCV: 75.5 fL — ABNORMAL LOW (ref 80.0–100.0)
Platelets: 164 10*3/uL (ref 150–400)
RBC: 3.18 MIL/uL — ABNORMAL LOW (ref 3.87–5.11)
RDW: 18 % — ABNORMAL HIGH (ref 11.5–15.5)
WBC: 7.1 10*3/uL (ref 4.0–10.5)
nRBC: 0 % (ref 0.0–0.2)

## 2021-02-03 MED ORDER — VANCOMYCIN HCL 1250 MG/250ML IV SOLN
1250.0000 mg | Freq: Two times a day (BID) | INTRAVENOUS | Status: DC
  Administered 2021-02-03 – 2021-02-05 (×5): 1250 mg via INTRAVENOUS
  Filled 2021-02-03 (×6): qty 250

## 2021-02-03 NOTE — Plan of Care (Signed)
  Problem: Education: Goal: Knowledge of General Education information will improve Description: Including pain rating scale, medication(s)/side effects and non-pharmacologic comfort measures Outcome: Adequate for Discharge   Problem: Health Behavior/Discharge Planning: Goal: Ability to manage health-related needs will improve Outcome: Adequate for Discharge   Problem: Clinical Measurements: Goal: Will remain free from infection Outcome: Adequate for Discharge

## 2021-02-03 NOTE — Progress Notes (Signed)
[facility] PROGRESS NOTE   Larya Charpentier CWC:[id number] DOB: 12/07/1980 DOA: 02/01/2021  PCP: Patient, No Pcp Per (Inactive)  Brief History/Interval Summary: 40 y.o. female without significant medical history presented with complains of pain in the back of her thigh.  She felt a bump in that area.  Presented due to worsening pain.  Hospitalized in May for symptomatic anemia.  At that time hemoglobin was 5.2 and she was transfused 4 units of PRBC.  CT scan showed evidence for cellulitis in the right posterior thigh without any evidence for abscess.  Consultants: None  Procedures: None    Subjective/Interval History: Patient mentions that she has noticed some drainage from her thigh wound yesterday.  Pain is still about 5-10 out of 10 intensity.  Denies any new complaints at this time.      Assessment/Plan:  Cellulitis involving right posterior thigh CT scan did not show any evidence for abscess.  WBC was normal.  No evidence for sepsis at the time of admission.  Patient was initially given vancomycin and cefepime in the ED.  She was subsequently placed on cefazolin.   Due to persistent fever.  She was changed back to vancomycin and cefepime.  She is noted to have drainage from the wound area.  We will leave the wound open for now.  If there is no improvement in the next 24 hours he may require surgery to incise and drain it a bit more.  Continue to monitor for now.   Fever curve has improved.  WBC remains normal.  Unfortunately looks like no cultures were sent at the time of admission.  Could send cultures from the wound.  Anemia due to chronic blood loss Hemoglobin had dropped to 6.5.  She was transfused 1 unit of PRBC.  Responded appropriately.  She is on iron supplementation.  We will check ferritin and iron levels tomorrow.  May benefit from Feraheme infusion.  Menorrhagia secondary to fibroids Patient told that she will need to follow-up with GYN for this issue.  She will  likely require surgical intervention.  She understands.  No active bleeding is present currently.    DVT Prophylaxis: Lovenox will be initiated Code Status: Full code Family Communication: Discussed with the patient Disposition Plan: Hopefully return home in improved.  Continue to mobilize.  Status is: Inpatient  Remains inpatient appropriate because:IV treatments appropriate due to intensity of illness or inability to take PO and Inpatient level of care appropriate due to severity of illness  Dispo:  Patient From: Home  Planned Disposition: Home  Medically stable for discharge: No         Medications: Scheduled:  sodium chloride   Intravenous Once   docusate sodium  100 mg Oral BID   enoxaparin (LOVENOX) injection  40 mg Subcutaneous Q24H   ferrous sulfate  325 mg Oral BID   Continuous:  ceFEPime (MAXIPIME) IV 2 g (02/03/21 0550)   lactated ringers 75 mL/hr at 02/03/21 1151   vancomycin     VOH:[id number] **OR** acetaminophen, bisacodyl, hydrALAZINE, ibuprofen, morphine injection, ondansetron **OR** ondansetron (ZOFRAN) IV, oxyCODONE, polyethylene glycol  Antibiotics: Anti-infectives (From admission, onward)    Start     Dose/Rate Route Frequency Ordered Stop   02/03/21 1415  vancomycin (VANCOREADY) IVPB 1250 mg/250 mL        1,250 mg 166.7 mL/hr over 90 Minutes Intravenous Every 12 hours 02/03/21 1122     02/02/21 1415  vancomycin (VANCOREADY) IVPB 1750 mg/350 mL  Status:  Discontinued  1,750 mg 175 mL/hr over 120 Minutes Intravenous Every 12 hours 02/02/21 1322 02/03/21 1122   02/02/21 1400  ceFEPIme (MAXIPIME) 2 g in sodium chloride 0.9 % 100 mL IVPB        2 g 200 mL/hr over 30 Minutes Intravenous Every 8 hours 02/02/21 1151     02/01/21 1728  ceFAZolin (ANCEF) IVPB 1 g/50 mL premix  Status:  Discontinued        1 g 100 mL/hr over 30 Minutes Intravenous Every 8 hours 02/01/21 1621 02/02/21 1015   02/01/21 1445  vancomycin (VANCOREADY) IVPB 2000 mg/400  mL        2,000 mg 200 mL/hr over 120 Minutes Intravenous  Once 02/01/21 1435 02/01/21 1733   02/01/21 1445  ceFEPIme (MAXIPIME) 2 g in sodium chloride 0.9 % 100 mL IVPB        2 g 200 mL/hr over 30 Minutes Intravenous  Once 02/01/21 1435 02/01/21 1515       Objective:  Vital Signs  Vitals:   02/02/21 1404 02/02/21 2121 02/03/21 0755 02/03/21 1053  BP: (!) 105/58 125/71 115/69   Pulse: 94 79 74   Resp: 18 18 18    Temp: 100.3 F (37.9 C) 99.7 F (37.6 C) 98.6 F (37 C)   TempSrc: Oral Oral Oral   SpO2: 100%  100%   Weight:    84.6 kg  Height: 6' (1.829 m)   6' (1.829 m)    Intake/Output Summary (Last 24 hours) at 02/03/2021 1156 Last data filed at 02/03/2021 1100 Gross per 24 hour  Intake 758 ml  Output --  Net 758 ml    Filed Weights   02/03/21 1053  Weight: 84.6 kg    General appearance: Awake alert.  In no distress Resp: Clear to auscultation bilaterally.  Normal effort Cardio: S1-S2 is normal regular.  No S3-S4.  No rubs murmurs or bruit GI: Abdomen is soft.  Nontender nondistended.  Bowel sounds are present normal.  No masses organomegaly Extremities: Erythema is about the same over the posterior right thigh.  Warm to touch.  Tender to palpate.  Indurated area noted.  Yellowish drainage is present. Neurologic: Alert and oriented x3.  No focal neurological deficits.    Lab Results:  Data Reviewed: I have personally reviewed following labs and imaging studies  CBC: Recent Labs  Lab 02/01/21 0853 02/02/21 0453 02/02/21 1626 02/03/21 0452  WBC 10.3 8.4  --  7.1  NEUTROABS 7.5  --   --   --   HGB 7.3* 6.5* 7.3* 7.2*  HCT 26.3* 21.8* 24.0* 24.0*  MCV 75.8* 73.6*  --  75.5*  PLT 234 181  --  164     Basic Metabolic Panel: Recent Labs  Lab 02/01/21 0853 02/02/21 0453 02/03/21 0452  NA 135 137 139  K 4.0 3.7 3.6  CL 103 106 107  CO2 24 24 24   GLUCOSE 98 100* 83  BUN <5* 6 5*  CREATININE 0.83 0.70 0.68  CALCIUM 9.5 8.6* 8.6*      GFR: Estimated Creatinine Clearance: 109 mL/min (by C-G formula based on SCr of 0.68 mg/dL).    Recent Results (from the past 240 hour(s))  Resp Panel by RT-PCR (Flu A&B, Covid) Nasopharyngeal Swab     Status: None   Collection Time: 02/01/21  2:57 PM   Specimen: Nasopharyngeal Swab; Nasopharyngeal(NP) swabs in vial transport medium  Result Value Ref Range Status   SARS Coronavirus 2 by RT PCR NEGATIVE NEGATIVE Final  Comment: (NOTE) SARS-CoV-2 target nucleic acids are NOT DETECTED.  The SARS-CoV-2 RNA is generally detectable in upper respiratory specimens during the acute phase of infection. The lowest concentration of SARS-CoV-2 viral copies this assay can detect is 138 copies/mL. A negative result does not preclude SARS-Cov-2 infection and should not be used as the sole basis for treatment or other patient management decisions. A negative result may occur with  improper specimen collection/handling, submission of specimen other than nasopharyngeal swab, presence of viral mutation(s) within the areas targeted by this assay, and inadequate number of viral copies(<138 copies/mL). A negative result must be combined with clinical observations, patient history, and epidemiological information. The expected result is Negative.  Fact Sheet for Patients:  EntrepreneurPulse.com.au  Fact Sheet for Healthcare Providers:  IncredibleEmployment.be  This test is no t yet approved or cleared by the [place] FDA and  has been authorized for detection and/or diagnosis of SARS-CoV-2 by FDA under an Emergency Use Authorization (EUA). This EUA will remain  in effect (meaning this test can be used) for the duration of the COVID-19 declaration under Section 564(b)(1) of the Act, 21 U.S.C.section 360bbb-3(b)(1), unless the authorization is terminated  or revoked sooner.       Influenza A by PCR NEGATIVE NEGATIVE Final   Influenza B by PCR  NEGATIVE NEGATIVE Final    Comment: (NOTE) The Xpert Xpress SARS-CoV-2/FLU/RSV plus assay is intended as an aid in the diagnosis of influenza from Nasopharyngeal swab specimens and should not be used as a sole basis for treatment. Nasal washings and aspirates are unacceptable for Xpert Xpress SARS-CoV-2/FLU/RSV testing.  Fact Sheet for Patients: EntrepreneurPulse.com.au  Fact Sheet for Healthcare Providers: IncredibleEmployment.be  This test is not yet approved or cleared by the [place] FDA and has been authorized for detection and/or diagnosis of SARS-CoV-2 by FDA under an Emergency Use Authorization (EUA). This EUA will remain in effect (meaning this test can be used) for the duration of the COVID-19 declaration under Section 564(b)(1) of the Act, 21 U.S.C. section 360bbb-3(b)(1), unless the authorization is terminated or revoked.  Performed at [facility], [place] [address], [place], [place] [address]        Radiology Studies: CT PELVIS W CONTRAST  Result Date: 02/01/2021 CLINICAL DATA:  Soft tissue infection suspected, thigh, xray done proximal posterior abscess; Soft tissue infection suspected, pelvis, no prior imaging EXAM: CT PELVIS WITH CONTRAST CT OF THE LOWER RIGHT EXTREMITY WITH CONTRAST TECHNIQUE: Multidetector CT imaging of the pelvis and lower right extremity was performed according to the standard protocol following intravenous contrast administration. CONTRAST:  126mL OMNIPAQUE IOHEXOL 300 MG/ML  SOLN COMPARISON:  None. FINDINGS: Bones/Joint/Cartilage No acute fracture or dislocation. No pelvic diastasis. Mild sclerotic changes associated with the mid right sacroiliac joint without associated erosion, likely degenerative. Hip joint spaces are maintained. No hip joint effusion is seen. Right femur intact and unremarkable. No erosion or periosteal elevation. Ligaments Suboptimally assessed by CT. Muscles and Tendons No acute  musculotendinous abnormality of the pelvis or right thigh. No intramuscular fluid collection. Soft tissues Prominent induration within the posterior subcutaneous soft tissues of the proximal right thigh overlying and just below the inferior right gluteus maximus muscle. There is overlying skin thickening. No well-defined or rim enhancing fluid collection. No soft tissue gas. No soft tissue ulceration or defect is evident. Soft tissues of the pelvis and right thigh appear otherwise unremarkable. Mildly prominent right inguinal lymph nodes are likely reactive. Enlarged leiomyomatous uterus with multiple fibroids which  measure approximately 4.8 cm at the anterior uterine body and a 3.7 cm fibroid along the posterior uterine body which appears partially submucosal. IUD is present. No acute findings are seen within the pelvis. IMPRESSION: 1. Prominent induration within the posterior subcutaneous soft tissues of the proximal right thigh overlying and just below the inferior right gluteus maximus muscle. There is overlying skin thickening. No well-defined or rim enhancing fluid collection. No soft tissue gas. Findings are most compatible with cellulitis. 2. Mildly prominent right inguinal lymph nodes, likely reactive. 3. No acute osseous abnormality of the pelvis or right femur. 4. Enlarged leiomyomatous uterus. Electronically Signed   By: Davina Poke D.O.   On: 02/01/2021 14:28   CT FEMUR RIGHT W CONTRAST  Result Date: 02/01/2021 CLINICAL DATA:  Soft tissue infection suspected, thigh, xray done proximal posterior abscess; Soft tissue infection suspected, pelvis, no prior imaging EXAM: CT PELVIS WITH CONTRAST CT OF THE LOWER RIGHT EXTREMITY WITH CONTRAST TECHNIQUE: Multidetector CT imaging of the pelvis and lower right extremity was performed according to the standard protocol following intravenous contrast administration. CONTRAST:  167mL OMNIPAQUE IOHEXOL 300 MG/ML  SOLN COMPARISON:  None. FINDINGS:  Bones/Joint/Cartilage No acute fracture or dislocation. No pelvic diastasis. Mild sclerotic changes associated with the mid right sacroiliac joint without associated erosion, likely degenerative. Hip joint spaces are maintained. No hip joint effusion is seen. Right femur intact and unremarkable. No erosion or periosteal elevation. Ligaments Suboptimally assessed by CT. Muscles and Tendons No acute musculotendinous abnormality of the pelvis or right thigh. No intramuscular fluid collection. Soft tissues Prominent induration within the posterior subcutaneous soft tissues of the proximal right thigh overlying and just below the inferior right gluteus maximus muscle. There is overlying skin thickening. No well-defined or rim enhancing fluid collection. No soft tissue gas. No soft tissue ulceration or defect is evident. Soft tissues of the pelvis and right thigh appear otherwise unremarkable. Mildly prominent right inguinal lymph nodes are likely reactive. Enlarged leiomyomatous uterus with multiple fibroids which measure approximately 4.8 cm at the anterior uterine body and a 3.7 cm fibroid along the posterior uterine body which appears partially submucosal. IUD is present. No acute findings are seen within the pelvis. IMPRESSION: 1. Prominent induration within the posterior subcutaneous soft tissues of the proximal right thigh overlying and just below the inferior right gluteus maximus muscle. There is overlying skin thickening. No well-defined or rim enhancing fluid collection. No soft tissue gas. Findings are most compatible with cellulitis. 2. Mildly prominent right inguinal lymph nodes, likely reactive. 3. No acute osseous abnormality of the pelvis or right femur. 4. Enlarged leiomyomatous uterus. Electronically Signed   By: Davina Poke D.O.   On: 02/01/2021 14:28       LOS: 1 day   [facility] Pager on [email or web address]  02/03/2021, 11:56 AM

## 2021-02-04 LAB — CBC
HCT: 25.5 % — ABNORMAL LOW (ref 36.0–46.0)
Hemoglobin: 7.5 g/dL — ABNORMAL LOW (ref 12.0–15.0)
MCH: 22.5 pg — ABNORMAL LOW (ref 26.0–34.0)
MCHC: 29.4 g/dL — ABNORMAL LOW (ref 30.0–36.0)
MCV: 76.3 fL — ABNORMAL LOW (ref 80.0–100.0)
Platelets: 190 10*3/uL (ref 150–400)
RBC: 3.34 MIL/uL — ABNORMAL LOW (ref 3.87–5.11)
RDW: 18.1 % — ABNORMAL HIGH (ref 11.5–15.5)
WBC: 6.2 10*3/uL (ref 4.0–10.5)
nRBC: 0 % (ref 0.0–0.2)

## 2021-02-04 LAB — BASIC METABOLIC PANEL
Anion gap: 7 (ref 5–15)
BUN: 7 mg/dL (ref 6–20)
CO2: 25 mmol/L (ref 22–32)
Calcium: 9 mg/dL (ref 8.9–10.3)
Chloride: 107 mmol/L (ref 98–111)
Creatinine, Ser: 0.68 mg/dL (ref 0.44–1.00)
GFR, Estimated: >60 mL/min (ref >60)
Glucose, Bld: 107 mg/dL — ABNORMAL HIGH (ref 70–99)
Potassium: 3.5 mmol/L (ref 3.5–5.1)
Sodium: 139 mmol/L (ref 135–145)

## 2021-02-04 LAB — IRON AND TIBC
Iron: 50 ug/dL (ref 28–170)
Saturation Ratios: 14 % (ref 10.4–31.8)
TIBC: 360 ug/dL (ref 250–450)
UIBC: 310 ug/dL

## 2021-02-04 LAB — VITAMIN B12: Vitamin B-12: 270 pg/mL (ref 180–914)

## 2021-02-04 LAB — RETICULOCYTES
Immature Retic Fract: 21.7 % — ABNORMAL HIGH (ref 2.3–15.9)
RBC.: 3.33 MIL/uL — ABNORMAL LOW (ref 3.87–5.11)
Retic Count, Absolute: 47 10*3/uL (ref 19.0–186.0)
Retic Ct Pct: 1.4 % (ref 0.4–3.1)

## 2021-02-04 LAB — FOLATE: Folate: 8.9 ng/mL (ref >5.9)

## 2021-02-04 LAB — FERRITIN: Ferritin: 13 ng/mL (ref 11–307)

## 2021-02-04 MED ORDER — VITAMIN B-12 1000 MCG PO TABS
500.0000 ug | ORAL_TABLET | Freq: Every day | ORAL | Status: DC
  Administered 2021-02-04 – 2021-02-05 (×2): 500 ug via ORAL
  Filled 2021-02-04 (×2): qty 1

## 2021-02-04 MED ORDER — SODIUM CHLORIDE 0.9 % IV SOLN
250.0000 mg | Freq: Every day | INTRAVENOUS | Status: AC
  Administered 2021-02-04 – 2021-02-05 (×2): 250 mg via INTRAVENOUS
  Filled 2021-02-04 (×2): qty 20

## 2021-02-04 MED ORDER — POTASSIUM CHLORIDE CRYS ER 20 MEQ PO TBCR
40.0000 meq | EXTENDED_RELEASE_TABLET | Freq: Once | ORAL | Status: AC
  Administered 2021-02-04: 40 meq via ORAL
  Filled 2021-02-04: qty 2

## 2021-02-04 NOTE — Plan of Care (Signed)

## 2021-02-04 NOTE — Plan of Care (Signed)

## 2021-02-04 NOTE — Progress Notes (Signed)
[facility] PROGRESS NOTE   Caitlin Maddox NOM:[id number] DOB: [birth year] DOA: 02/01/2021  PCP: Patient, No Pcp Per (Inactive)  Brief History/Interval Summary: 40 y.o. female without significant medical history presented with complains of pain in the back of her thigh.  She felt a bump in that area.  Presented due to worsening pain.  Hospitalized in May for symptomatic anemia.  At that time hemoglobin was 5.2 and she was transfused 4 units of PRBC.  CT scan showed evidence for cellulitis in the right posterior thigh without any evidence for abscess.  Consultants: None  Procedures: None    Subjective/Interval History: Patient continues to have pain in the back of her thigh.  7 out of 10 in intensity.  Has had yellowish to brownish drainage from the wound on leg.  Denies any fever or chills.  No nausea vomiting.  Has been ambulating.     Assessment/Plan:  Cellulitis involving right posterior thigh CT scan did not show any evidence for abscess.  WBC was normal.  No evidence for sepsis at the time of admission.   Patient was initially given vancomycin and cefepime in the ED.  She was subsequently placed on cefazolin.  Due to persistent fever.  She was changed back to vancomycin and cefepime.   She was noted to have drainage from the wound area.  Wound was left open.  Cultures were sent from the wound.  Preliminary results show that there is moderate gram-positive cocci. No blood cultures were sent at the time of admission.  Fever curve has significantly improved.  WBC remains normal.  Continue vancomycin and cefepime for another 24 hours. The wound looks much better today.  Not as erythematous.  Swelling has decreased.  Still there is some induration.  However I do not feel that she requires I&D at this time since the wound is open and appears to be draining.  Anemia due to chronic blood loss Hemoglobin had dropped to 6.5.  She was transfused 1 unit of PRBC.  Responded appropriately.   She is on iron supplementation.   Ferritin noted to be 13, TIBC 360, iron 50.  We will give her Feraheme infusion.   Vitamin B-12 level also noted to be borderline low.  Will initiate B12 supplementation.  Replace potassium.  Menorrhagia secondary to fibroids Patient told that she will need to follow-up with GYN for this issue.  She will likely require surgical intervention.  She understands.  No active bleeding is present currently.    DVT Prophylaxis: Lovenox will be initiated Code Status: Full code Family Communication: Discussed with the patient Disposition Plan: Hopefully home in 1 to 2 days.  Mobilize.  Status is: Inpatient  Remains inpatient appropriate because:IV treatments appropriate due to intensity of illness or inability to take PO and Inpatient level of care appropriate due to severity of illness  Dispo:  Patient From: Home  Planned Disposition: Home  Medically stable for discharge: No         Medications: Scheduled:  sodium chloride   Intravenous Once   docusate sodium  100 mg Oral BID   enoxaparin (LOVENOX) injection  40 mg Subcutaneous Q24H   ferrous sulfate  325 mg Oral BID   Continuous:  ceFEPime (MAXIPIME) IV 2 g (02/04/21 0617)   lactated ringers 75 mL/hr at 02/03/21 1151   vancomycin 1,250 mg (02/04/21 0217)   JGG:[id number] **OR** acetaminophen, bisacodyl, hydrALAZINE, ibuprofen, morphine injection, ondansetron **OR** ondansetron (ZOFRAN) IV, oxyCODONE, polyethylene glycol  Antibiotics: Anti-infectives (From admission, onward)  Start     Dose/Rate Route Frequency Ordered Stop   02/03/21 1415  vancomycin (VANCOREADY) IVPB 1250 mg/250 mL        1,250 mg 166.7 mL/hr over 90 Minutes Intravenous Every 12 hours 02/03/21 1122     02/02/21 1415  vancomycin (VANCOREADY) IVPB 1750 mg/350 mL  Status:  Discontinued        1,750 mg 175 mL/hr over 120 Minutes Intravenous Every 12 hours 02/02/21 1322 02/03/21 1122   02/02/21 1400  ceFEPIme (MAXIPIME) 2  g in sodium chloride 0.9 % 100 mL IVPB        2 g 200 mL/hr over 30 Minutes Intravenous Every 8 hours 02/02/21 1151     02/01/21 1728  ceFAZolin (ANCEF) IVPB 1 g/50 mL premix  Status:  Discontinued        1 g 100 mL/hr over 30 Minutes Intravenous Every 8 hours 02/01/21 1621 02/02/21 1015   02/01/21 1445  vancomycin (VANCOREADY) IVPB 2000 mg/400 mL        2,000 mg 200 mL/hr over 120 Minutes Intravenous  Once 02/01/21 1435 02/01/21 1733   02/01/21 1445  ceFEPIme (MAXIPIME) 2 g in sodium chloride 0.9 % 100 mL IVPB        2 g 200 mL/hr over 30 Minutes Intravenous  Once 02/01/21 1435 02/01/21 1515       Objective:  Vital Signs  Vitals:   02/03/21 1053 02/03/21 1500 02/03/21 2303 02/04/21 0735  BP:  127/85 133/88 118/81  Pulse:  77 65 68  Resp:  18 16 15   Temp:  98.6 F (37 C) 98.5 F (36.9 C) 98.5 F (36.9 C)  TempSrc:  Oral  Oral  SpO2:  100% 100% 100%  Weight: 84.6 kg     Height: 6' (1.829 m)       Intake/Output Summary (Last 24 hours) at 02/04/2021 0956 Last data filed at 02/04/2021 0617 Gross per 24 hour  Intake 4240 ml  Output --  Net 4240 ml    Filed Weights   02/03/21 1053  Weight: 84.6 kg    General appearance: Awake alert.  In no distress Resp: Clear to auscultation bilaterally.  Normal effort Cardio: S1-S2 is normal regular.  No S3-S4.  No rubs murmurs or bruit GI: Abdomen is soft.  Nontender nondistended.  Bowel sounds are present normal.  No masses organomegaly Extremities: Significantly improved erythema.  Yellowish to brownish drainage noted.  Indurated area is less than yesterday.  Less warm to touch. Neurologic: Alert and oriented x3.  No focal neurological deficits.     Lab Results:  Data Reviewed: I have personally reviewed following labs and imaging studies  CBC: Recent Labs  Lab 02/01/21 0853 02/02/21 0453 02/02/21 1626 02/03/21 0452 02/04/21 0026  WBC 10.3 8.4  --  7.1 6.2  NEUTROABS 7.5  --   --   --   --   HGB 7.3* 6.5* 7.3* 7.2*  7.5*  HCT 26.3* 21.8* 24.0* 24.0* 25.5*  MCV 75.8* 73.6*  --  75.5* 76.3*  PLT 234 181  --  164 190     Basic Metabolic Panel: Recent Labs  Lab 02/01/21 0853 02/02/21 0453 02/03/21 0452 02/04/21 0026  NA 135 137 139 139  K 4.0 3.7 3.6 3.5  CL 103 106 107 107  CO2 24 24 24 25   GLUCOSE 98 100* 83 107*  BUN <5* 6 5* 7  CREATININE 0.83 0.70 0.68 0.68  CALCIUM 9.5 8.6* 8.6* 9.0     GFR:  Estimated Creatinine Clearance: 109 mL/min (by C-G formula based on SCr of 0.68 mg/dL).    Recent Results (from the past 240 hour(s))  Resp Panel by RT-PCR (Flu A&B, Covid) Nasopharyngeal Swab     Status: None   Collection Time: 02/01/21  2:57 PM   Specimen: Nasopharyngeal Swab; Nasopharyngeal(NP) swabs in vial transport medium  Result Value Ref Range Status   SARS Coronavirus 2 by RT PCR NEGATIVE NEGATIVE Final    Comment: (NOTE) SARS-CoV-2 target nucleic acids are NOT DETECTED.  The SARS-CoV-2 RNA is generally detectable in upper respiratory specimens during the acute phase of infection. The lowest concentration of SARS-CoV-2 viral copies this assay can detect is 138 copies/mL. A negative result does not preclude SARS-Cov-2 infection and should not be used as the sole basis for treatment or other patient management decisions. A negative result may occur with  improper specimen collection/handling, submission of specimen other than nasopharyngeal swab, presence of viral mutation(s) within the areas targeted by this assay, and inadequate number of viral copies(<138 copies/mL). A negative result must be combined with clinical observations, patient history, and epidemiological information. The expected result is Negative.  Fact Sheet for Patients:  EntrepreneurPulse.com.au  Fact Sheet for Healthcare Providers:  IncredibleEmployment.be  This test is no t yet approved or cleared by the [place] FDA and  has been authorized for detection and/or  diagnosis of SARS-CoV-2 by FDA under an Emergency Use Authorization (EUA). This EUA will remain  in effect (meaning this test can be used) for the duration of the COVID-19 declaration under Section 564(b)(1) of the Act, 21 U.S.C.section 360bbb-3(b)(1), unless the authorization is terminated  or revoked sooner.       Influenza A by PCR NEGATIVE NEGATIVE Final   Influenza B by PCR NEGATIVE NEGATIVE Final    Comment: (NOTE) The Xpert Xpress SARS-CoV-2/FLU/RSV plus assay is intended as an aid in the diagnosis of influenza from Nasopharyngeal swab specimens and should not be used as a sole basis for treatment. Nasal washings and aspirates are unacceptable for Xpert Xpress SARS-CoV-2/FLU/RSV testing.  Fact Sheet for Patients: EntrepreneurPulse.com.au  Fact Sheet for Healthcare Providers: IncredibleEmployment.be  This test is not yet approved or cleared by the [place] FDA and has been authorized for detection and/or diagnosis of SARS-CoV-2 by FDA under an Emergency Use Authorization (EUA). This EUA will remain in effect (meaning this test can be used) for the duration of the COVID-19 declaration under Section 564(b)(1) of the Act, 21 U.S.C. section 360bbb-3(b)(1), unless the authorization is terminated or revoked.  Performed at [facility], [place] [address], [place], [place] [address]   Aerobic Culture w Gram Stain (superficial specimen)     Status: None (Preliminary result)   Collection Time: 02/03/21  1:52 PM   Specimen: Wound  Result Value Ref Range Status   Specimen Description WOUND RIGHT THIGH  Final   Special Requests NONE  Final   Gram Stain   Final    NO ORGANISMS SEEN SQUAMOUS EPITHELIAL CELLS PRESENT ABUNDANT WBC PRESENT, PREDOMINANTLY PMN MODERATE GRAM POSITIVE COCCI Performed at [facility], [place] [address] [place], [place] [address]    Culture PENDING  Incomplete   Report Status PENDING  Incomplete        Radiology Studies: No results found.     LOS: 2 days   [facility] Pager on [email or web address]  02/04/2021, 9:56 AM

## 2021-02-05 DIAGNOSIS — N938 Other specified abnormal uterine and vaginal bleeding: Secondary | ICD-10-CM

## 2021-02-05 LAB — AEROBIC CULTURE W GRAM STAIN (SUPERFICIAL SPECIMEN)

## 2021-02-05 LAB — CBC
HCT: 23.7 % — ABNORMAL LOW (ref 36.0–46.0)
Hemoglobin: 7 g/dL — ABNORMAL LOW (ref 12.0–15.0)
MCH: 22.6 pg — ABNORMAL LOW (ref 26.0–34.0)
MCHC: 29.5 g/dL — ABNORMAL LOW (ref 30.0–36.0)
MCV: 76.5 fL — ABNORMAL LOW (ref 80.0–100.0)
Platelets: 178 10*3/uL (ref 150–400)
RBC: 3.1 MIL/uL — ABNORMAL LOW (ref 3.87–5.11)
RDW: 18.4 % — ABNORMAL HIGH (ref 11.5–15.5)
WBC: 4.9 10*3/uL (ref 4.0–10.5)
nRBC: 0 % (ref 0.0–0.2)

## 2021-02-05 LAB — BASIC METABOLIC PANEL
Anion gap: 6 (ref 5–15)
BUN: 7 mg/dL (ref 6–20)
CO2: 23 mmol/L (ref 22–32)
Calcium: 8.7 mg/dL — ABNORMAL LOW (ref 8.9–10.3)
Chloride: 108 mmol/L (ref 98–111)
Creatinine, Ser: 0.71 mg/dL (ref 0.44–1.00)
GFR, Estimated: >60 mL/min (ref >60)
Glucose, Bld: 103 mg/dL — ABNORMAL HIGH (ref 70–99)
Potassium: 3.5 mmol/L (ref 3.5–5.1)
Sodium: 137 mmol/L (ref 135–145)

## 2021-02-05 LAB — MAGNESIUM: Magnesium: 1.7 mg/dL (ref 1.7–2.4)

## 2021-02-05 LAB — HEMOGLOBIN AND HEMATOCRIT, BLOOD
HCT: 27.8 % — ABNORMAL LOW (ref 36.0–46.0)
Hemoglobin: 8 g/dL — ABNORMAL LOW (ref 12.0–15.0)

## 2021-02-05 MED ORDER — CYANOCOBALAMIN 500 MCG PO TABS: 500.0000 ug | ORAL_TABLET | Freq: Every day | ORAL | 1 refills | Status: DC

## 2021-02-05 MED ORDER — DOXYCYCLINE HYCLATE 100 MG PO CAPS: 100.0000 mg | ORAL_CAPSULE | Freq: Two times a day (BID) | ORAL | 0 refills | Status: AC

## 2021-02-05 MED ORDER — FERROUS SULFATE 325 (65 FE) MG PO TABS: 325.0000 mg | ORAL_TABLET | Freq: Two times a day (BID) | ORAL | 1 refills | Status: DC

## 2021-02-05 NOTE — Progress Notes (Signed)
1434 - AVS d/c packet was reviewed with pt at bedside. Pt verbalized understanding of teaching and is currently waiting for her antibiotics to be completed before being d/c from the unit. Pt belongings (cell phone and clothes) has been collected together by pt. IV has been removed and catheter is intact. Pt safely escorted from unit by staff.

## 2021-02-05 NOTE — Discharge Summary (Signed)
Physician Discharge Summary  Haylea Schlichting CVE:[id number] DOB: 09/15/1980 DOA: 02/01/2021  PCP: Patient, No Pcp Per (Inactive)  Admit date: 02/01/2021 Discharge date: 02/05/2021  Admitted From: Home Disposition: Home   Recommendations for Outpatient Follow-up:  Follow up with PCP in 1-2 weeks and/or as scheduled at the [facility] on 10/27. Suggest recheck CBC and iron levels at that appointment and referral to OB/GYN if needed to manage menorrhagia causing transfusion-dependent anemia.  Suggest recheck B12 level. Supplementation begun at discharge.   Home Health: None Equipment/Devices: None Discharge Condition: Stable CODE STATUS: Full Diet recommendation: Heart healthy  Brief/Interim Summary: Grey Schlauch is a 40 y.o. female without significant medical history presented with complains of pain in the back of her thigh.  She felt a bump in that area.  Presented due to worsening pain.  Hospitalized in May for symptomatic anemia.  At that time hemoglobin was 5.2 and she was transfused 4 units of PRBC.  CT scan showed evidence for cellulitis in the right posterior thigh without any evidence for abscess. She was admitted, put on IV antibiotics with sustained clinical improvement, subsequently discharged 9/28 in stable condition.  Discharge Diagnoses:  Principal Problem:   Cellulitis and abscess of buttock Active Problems:   Dysfunctional uterine bleeding   Cellulitis  Cellulitis involving right posterior thigh:  CT scan did not show any evidence for abscess.  WBC was normal.  No evidence for sepsis at the time of admission.  Patient was initially given vancomycin and cefepime in the ED.  She was subsequently placed on cefazolin.  Due to persistent fever.  She was changed back to vancomycin and cefepime.   She was noted to have drainage from the wound area.  Wound was left open.  Cultures were sent from the wound growing MRSA.  No blood cultures were sent at the time  of admission.  Fever curve has significantly improved.  WBC remains normal.   - Continue doxycycline at discharge   Anemia due to chronic blood loss: Received 1u PRBCs and IV iron during this admission with hgb 8g/dl at discharge without active bleeding.    Menorrhagia secondary to fibroids: Patient told that she will need to follow-up with GYN for this issue.  She will likely require surgical intervention.  She understands.  No active bleeding is present currently.  Discharge Instructions Discharge Instructions     Discharge instructions   Complete by: As directed    Take doxycycline twice daily for 5 more days to complete treatment for the infection. Because your blood count was low, we gave a unit of blood, IV iron, and you should continue taking oral iron supplementation. You should also follow up with your doctor and/or OBGYN regarding ways to decrease bleeding. Because your B12 level was on the lower side of normal, a low dose supplement has been prescribed and should be taken daily. If you have any fever, worsening pain or discharge from the area or chest pain, dyspnea, or new/worsening bleeding, you need to seek medical attention right away. Otherwise, follow up with your PCP. An appointment has been scheduled for you 10/27 at [facility].      Allergies as of 02/05/2021       Reactions   Clindamycin/lincomycin Anaphylaxis, Swelling        Medication List     TAKE these medications    acetaminophen 500 MG tablet Commonly known as: TYLENOL Take 500-1,000 mg by mouth every 6 (six) hours  as needed for mild pain or headache.   doxycycline 100 MG capsule Commonly known as: VIBRAMYCIN Take 1 capsule (100 mg total) by mouth 2 (two) times daily for 5 days.   ferrous sulfate 325 (65 FE) MG tablet Commonly known as: FerrouSul Take 1 tablet (325 mg total) by mouth 2 (two) times daily.   ibuprofen 200 MG tablet Commonly known as: ADVIL Take 600 mg by  mouth every 6 (six) hours as needed for mild pain, headache or cramping.   megestrol 40 MG tablet Commonly known as: MEGACE Take 1 tablet (40 mg total) by mouth 2 (two) times daily. Can increase to two tablets twice a day in the event of heavy bleeding What changed:  when to take this reasons to take this additional instructions   vitamin B-12 500 MCG tablet Commonly known as: CYANOCOBALAMIN Take 1 tablet (500 mcg total) by mouth daily. Start taking on: February 06, 2021        [facility] Follow up on 03/06/2021.   Contact information: [place] [id number] [phone]               Allergies  Allergen Reactions   Clindamycin/Lincomycin Anaphylaxis and Swelling    Consultations: None  Procedures/Studies: DG Ankle Complete Left  Result Date: 01/28/2021 CLINICAL DATA:  Left ankle pain. EXAM: LEFT ANKLE COMPLETE - 3+ VIEW COMPARISON:  None. FINDINGS: There is no evidence of fracture, dislocation, or joint effusion. Ankle mortise is preserved. Tiny plantar calcaneal spur. There is no evidence of arthropathy or other focal bone abnormality. Mild soft tissue edema. IMPRESSION: Mild soft tissue edema without acute osseous abnormality. Electronically Signed   By: Keith Rake M.D.   On: 01/28/2021 18:02   CT PELVIS W CONTRAST  Result Date: 02/01/2021 CLINICAL DATA:  Soft tissue infection suspected, thigh, xray done proximal posterior abscess; Soft tissue infection suspected, pelvis, no prior imaging EXAM: CT PELVIS WITH CONTRAST CT OF THE LOWER RIGHT EXTREMITY WITH CONTRAST TECHNIQUE: Multidetector CT imaging of the pelvis and lower right extremity was performed according to the standard protocol following intravenous contrast administration. CONTRAST:  170mL OMNIPAQUE IOHEXOL 300 MG/ML  SOLN COMPARISON:  None. FINDINGS: Bones/Joint/Cartilage No acute fracture or dislocation. No  pelvic diastasis. Mild sclerotic changes associated with the mid right sacroiliac joint without associated erosion, likely degenerative. Hip joint spaces are maintained. No hip joint effusion is seen. Right femur intact and unremarkable. No erosion or periosteal elevation. Ligaments Suboptimally assessed by CT. Muscles and Tendons No acute musculotendinous abnormality of the pelvis or right thigh. No intramuscular fluid collection. Soft tissues Prominent induration within the posterior subcutaneous soft tissues of the proximal right thigh overlying and just below the inferior right gluteus maximus muscle. There is overlying skin thickening. No well-defined or rim enhancing fluid collection. No soft tissue gas. No soft tissue ulceration or defect is evident. Soft tissues of the pelvis and right thigh appear otherwise unremarkable. Mildly prominent right inguinal lymph nodes are likely reactive. Enlarged leiomyomatous uterus with multiple fibroids which measure approximately 4.8 cm at the anterior uterine body and a 3.7 cm fibroid along the posterior uterine body which appears partially submucosal. IUD is present. No acute findings are seen within the pelvis. IMPRESSION: 1. Prominent induration within the posterior subcutaneous soft tissues of the proximal right thigh overlying and just below the inferior right gluteus maximus muscle. There is overlying skin thickening. No well-defined or rim enhancing  fluid collection. No soft tissue gas. Findings are most compatible with cellulitis. 2. Mildly prominent right inguinal lymph nodes, likely reactive. 3. No acute osseous abnormality of the pelvis or right femur. 4. Enlarged leiomyomatous uterus. Electronically Signed   By: Davina Poke D.O.   On: 02/01/2021 14:28   CT FEMUR RIGHT W CONTRAST  Result Date: 02/01/2021 CLINICAL DATA:  Soft tissue infection suspected, thigh, xray done proximal posterior abscess; Soft tissue infection suspected, pelvis, no prior  imaging EXAM: CT PELVIS WITH CONTRAST CT OF THE LOWER RIGHT EXTREMITY WITH CONTRAST TECHNIQUE: Multidetector CT imaging of the pelvis and lower right extremity was performed according to the standard protocol following intravenous contrast administration. CONTRAST:  122mL OMNIPAQUE IOHEXOL 300 MG/ML  SOLN COMPARISON:  None. FINDINGS: Bones/Joint/Cartilage No acute fracture or dislocation. No pelvic diastasis. Mild sclerotic changes associated with the mid right sacroiliac joint without associated erosion, likely degenerative. Hip joint spaces are maintained. No hip joint effusion is seen. Right femur intact and unremarkable. No erosion or periosteal elevation. Ligaments Suboptimally assessed by CT. Muscles and Tendons No acute musculotendinous abnormality of the pelvis or right thigh. No intramuscular fluid collection. Soft tissues Prominent induration within the posterior subcutaneous soft tissues of the proximal right thigh overlying and just below the inferior right gluteus maximus muscle. There is overlying skin thickening. No well-defined or rim enhancing fluid collection. No soft tissue gas. No soft tissue ulceration or defect is evident. Soft tissues of the pelvis and right thigh appear otherwise unremarkable. Mildly prominent right inguinal lymph nodes are likely reactive. Enlarged leiomyomatous uterus with multiple fibroids which measure approximately 4.8 cm at the anterior uterine body and a 3.7 cm fibroid along the posterior uterine body which appears partially submucosal. IUD is present. No acute findings are seen within the pelvis. IMPRESSION: 1. Prominent induration within the posterior subcutaneous soft tissues of the proximal right thigh overlying and just below the inferior right gluteus maximus muscle. There is overlying skin thickening. No well-defined or rim enhancing fluid collection. No soft tissue gas. Findings are most compatible with cellulitis. 2. Mildly prominent right inguinal lymph  nodes, likely reactive. 3. No acute osseous abnormality of the pelvis or right femur. 4. Enlarged leiomyomatous uterus. Electronically Signed   By: Davina Poke D.O.   On: 02/01/2021 14:28      Subjective: Feels well, much less pain and discharge from wound. No fevers, wants to go home.   Discharge Exam: Vitals:   02/04/21 1528 02/05/21 0739  BP: 135/85 120/80  Pulse: 73 69  Resp: 14 19  Temp: (!) 97.3 F (36.3 C) 98.2 F (36.8 C)  SpO2: 100% 100%   General: Pt is alert, awake, not in acute distress Cardiovascular: RRR, S1/S2 +, no rubs, no gallops Respiratory: CTA bilaterally, no wheezing, no rhonchi Abdominal: Soft, NT, ND, bowel sounds + Extremities: No edema, no cyanosis Skin: Small open wound draining serious fluid minimally on right buttock/thigh. Mild induration without fluctuance.  Labs: BNP (last 3 results) No results for input(s): BNP in the last 8760 hours. Basic Metabolic Panel: Recent Labs  Lab 02/01/21 0853 02/02/21 0453 02/03/21 0452 02/04/21 0026 02/05/21 0328  NA 135 137 139 139 137  K 4.0 3.7 3.6 3.5 3.5  CL 103 106 107 107 108  CO2 24 24 24 25 23   GLUCOSE 98 100* 83 107* 103*  BUN <5* 6 5* 7 7  CREATININE 0.83 0.70 0.68 0.68 0.71  CALCIUM 9.5 8.6* 8.6* 9.0 8.7*  MG  --   --   --   --  1.7   Liver Function Tests: No results for input(s): AST, ALT, ALKPHOS, BILITOT, PROT, ALBUMIN in the last 168 hours. No results for input(s): LIPASE, AMYLASE in the last 168 hours. No results for input(s): AMMONIA in the last 168 hours. CBC: Recent Labs  Lab 02/01/21 0853 02/02/21 0453 02/02/21 1626 02/03/21 0452 02/04/21 0026 02/05/21 0328 02/05/21 1245  WBC 10.3 8.4  --  7.1 6.2 4.9  --   NEUTROABS 7.5  --   --   --   --   --   --   HGB 7.3* 6.5* 7.3* 7.2* 7.5* 7.0* 8.0*  HCT 26.3* 21.8* 24.0* 24.0* 25.5* 23.7* 27.8*  MCV 75.8* 73.6*  --  75.5* 76.3* 76.5*  --   PLT 234 181  --  164 190 178  --    Cardiac Enzymes: No results for input(s):  CKTOTAL, CKMB, CKMBINDEX, TROPONINI in the last 168 hours. BNP: Invalid input(s): POCBNP CBG: No results for input(s): GLUCAP in the last 168 hours. D-Dimer No results for input(s): DDIMER in the last 72 hours. Hgb A1c No results for input(s): HGBA1C in the last 72 hours. Lipid Profile No results for input(s): CHOL, HDL, LDLCALC, TRIG, CHOLHDL, LDLDIRECT in the last 72 hours. Thyroid function studies No results for input(s): TSH, T4TOTAL, T3FREE, THYROIDAB in the last 72 hours.  Invalid input(s): FREET3 Anemia work up Recent Labs    02/04/21 0026  VITAMINB12 270  FOLATE 8.9  FERRITIN 13  TIBC 360  IRON 50  RETICCTPCT 1.4   Urinalysis No results found for: COLORURINE, APPEARANCEUR, LABSPEC, [place], GLUCOSEU, [place], [place], KETONESUR, PROTEINUR, UROBILINOGEN, NITRITE, [place]  Microbiology Recent Results (from the past 240 hour(s))  Resp Panel by RT-PCR (Flu A&B, Covid) Nasopharyngeal Swab     Status: None   Collection Time: 02/01/21  2:57 PM   Specimen: Nasopharyngeal Swab; Nasopharyngeal(NP) swabs in vial transport medium  Result Value Ref Range Status   SARS Coronavirus 2 by RT PCR NEGATIVE NEGATIVE Final    Comment: (NOTE) SARS-CoV-2 target nucleic acids are NOT DETECTED.  The SARS-CoV-2 RNA is generally detectable in upper respiratory specimens during the acute phase of infection. The lowest concentration of SARS-CoV-2 viral copies this assay can detect is 138 copies/mL. A negative result does not preclude SARS-Cov-2 infection and should not be used as the sole basis for treatment or other patient management decisions. A negative result may occur with  improper specimen collection/handling, submission of specimen other than nasopharyngeal swab, presence of viral mutation(s) within the areas targeted by this assay, and inadequate number of viral copies(<138 copies/mL). A negative result must be combined with clinical observations, patient history, and  epidemiological information. The expected result is Negative.  Fact Sheet for Patients:  EntrepreneurPulse.com.au  Fact Sheet for Healthcare Providers:  IncredibleEmployment.be  This test is no t yet approved or cleared by the [place] FDA and  has been authorized for detection and/or diagnosis of SARS-CoV-2 by FDA under an Emergency Use Authorization (EUA). This EUA will remain  in effect (meaning this test can be used) for the duration of the COVID-19 declaration under Section 564(b)(1) of the Act, 21 U.S.C.section 360bbb-3(b)(1), unless the authorization is terminated  or revoked sooner.       Influenza A by PCR NEGATIVE NEGATIVE Final   Influenza B by PCR NEGATIVE NEGATIVE Final    Comment: (NOTE) The Xpert Xpress SARS-CoV-2/FLU/RSV plus assay is intended as an aid in the diagnosis of influenza from Nasopharyngeal swab specimens and should not be  used as a sole basis for treatment. Nasal washings and aspirates are unacceptable for Xpert Xpress SARS-CoV-2/FLU/RSV testing.  Fact Sheet for Patients: EntrepreneurPulse.com.au  Fact Sheet for Healthcare Providers: IncredibleEmployment.be  This test is not yet approved or cleared by the [place] FDA and has been authorized for detection and/or diagnosis of SARS-CoV-2 by FDA under an Emergency Use Authorization (EUA). This EUA will remain in effect (meaning this test can be used) for the duration of the COVID-19 declaration under Section 564(b)(1) of the Act, 21 U.S.C. section 360bbb-3(b)(1), unless the authorization is terminated or revoked.  Performed at [facility], [place] [address] [place], [place] [address]   Aerobic Culture w Gram Stain (superficial specimen)     Status: None   Collection Time: 02/03/21  1:52 PM   Specimen: Wound  Result Value Ref Range Status   Specimen Description WOUND RIGHT THIGH  Final   Special Requests NONE   Final   Gram Stain   Final    NO ORGANISMS SEEN SQUAMOUS EPITHELIAL CELLS PRESENT ABUNDANT WBC PRESENT, PREDOMINANTLY PMN MODERATE GRAM POSITIVE COCCI Performed at [facility], [place] [address] [place], [place] [address]    Culture FEW METHICILLIN RESISTANT STAPHYLOCOCCUS AUREUS  Final   Report Status 02/05/2021 FINAL  Final   Organism ID, Bacteria METHICILLIN RESISTANT STAPHYLOCOCCUS AUREUS  Final      Susceptibility   Methicillin resistant staphylococcus aureus - MIC*    CIPROFLOXACIN <=0.5 SENSITIVE Sensitive     ERYTHROMYCIN >=8 RESISTANT Resistant     GENTAMICIN <=0.5 SENSITIVE Sensitive     OXACILLIN >=4 RESISTANT Resistant     TETRACYCLINE <=1 SENSITIVE Sensitive     VANCOMYCIN 1 SENSITIVE Sensitive     TRIMETH/SULFA <=10 SENSITIVE Sensitive     CLINDAMYCIN <=0.25 SENSITIVE Sensitive     RIFAMPIN <=0.5 SENSITIVE Sensitive     Inducible Clindamycin NEGATIVE Sensitive     * FEW METHICILLIN RESISTANT STAPHYLOCOCCUS AUREUS    Time coordinating discharge: Approximately 40 minutes  Patrecia Pour, MD  [facility] 02/05/2021, 2:21 PM

## 2021-02-05 NOTE — Plan of Care (Signed)
  Problem: Education: Goal: Knowledge of General Education information will improve Description: Including pain rating scale, medication(s)/side effects and non-pharmacologic comfort measures 02/05/2021 1433 by Damaris Schooner, RN Outcome: Adequate for Discharge 02/05/2021 1209 by Damaris Schooner, RN Outcome: Progressing   Problem: Health Behavior/Discharge Planning: Goal: Ability to manage health-related needs will improve 02/05/2021 1433 by Damaris Schooner, RN Outcome: Adequate for Discharge 02/05/2021 1209 by Damaris Schooner, RN Outcome: Progressing   Problem: Clinical Measurements: Goal: Ability to maintain clinical measurements within normal limits will improve 02/05/2021 1433 by Damaris Schooner, RN Outcome: Adequate for Discharge 02/05/2021 1209 by Damaris Schooner, RN Outcome: Progressing Goal: Will remain free from infection 02/05/2021 1433 by Damaris Schooner, RN Outcome: Adequate for Discharge 02/05/2021 1209 by Damaris Schooner, RN Outcome: Progressing Goal: Diagnostic test results will improve 02/05/2021 1433 by Damaris Schooner, RN Outcome: Adequate for Discharge 02/05/2021 1209 by Damaris Schooner, RN Outcome: Progressing Goal: Respiratory complications will improve 02/05/2021 1433 by Damaris Schooner, RN Outcome: Adequate for Discharge 02/05/2021 1209 by Damaris Schooner, RN Outcome: Progressing Goal: Cardiovascular complication will be avoided 02/05/2021 1433 by Damaris Schooner, RN Outcome: Adequate for Discharge 02/05/2021 1209 by Damaris Schooner, RN Outcome: Progressing   Problem: Activity: Goal: Risk for activity intolerance will decrease 02/05/2021 1433 by Damaris Schooner, RN Outcome: Adequate for Discharge 02/05/2021 1209 by Damaris Schooner, RN Outcome: Progressing   Problem: Nutrition: Goal: Adequate nutrition will be maintained 02/05/2021 1433 by Damaris Schooner, RN Outcome: Adequate for Discharge 02/05/2021 1209 by Damaris Schooner, RN Outcome: Progressing    Problem: Coping: Goal: Level of anxiety will decrease 02/05/2021 1433 by Damaris Schooner, RN Outcome: Adequate for Discharge 02/05/2021 1209 by Damaris Schooner, RN Outcome: Progressing   Problem: Elimination: Goal: Will not experience complications related to bowel motility 02/05/2021 1433 by Damaris Schooner, RN Outcome: Adequate for Discharge 02/05/2021 1209 by Damaris Schooner, RN Outcome: Progressing Goal: Will not experience complications related to urinary retention 02/05/2021 1433 by Damaris Schooner, RN Outcome: Adequate for Discharge 02/05/2021 1209 by Damaris Schooner, RN Outcome: Progressing   Problem: Pain Managment: Goal: General experience of comfort will improve 02/05/2021 1433 by Damaris Schooner, RN Outcome: Adequate for Discharge 02/05/2021 1209 by Damaris Schooner, RN Outcome: Progressing   Problem: Safety: Goal: Ability to remain free from injury will improve 02/05/2021 1433 by Damaris Schooner, RN Outcome: Adequate for Discharge 02/05/2021 1209 by Damaris Schooner, RN Outcome: Progressing   Problem: Skin Integrity: Goal: Risk for impaired skin integrity will decrease 02/05/2021 1433 by Damaris Schooner, RN Outcome: Adequate for Discharge 02/05/2021 1209 by Damaris Schooner, RN Outcome: Progressing

## 2021-02-05 NOTE — Plan of Care (Signed)

## 2021-03-06 ENCOUNTER — Other Ambulatory Visit: Payer: Self-pay

## 2021-03-06 ENCOUNTER — Ambulatory Visit (INDEPENDENT_AMBULATORY_CARE_PROVIDER_SITE_OTHER): Payer: PRIVATE HEALTH INSURANCE | Admitting: Primary Care

## 2021-03-06 ENCOUNTER — Telehealth (INDEPENDENT_AMBULATORY_CARE_PROVIDER_SITE_OTHER): Payer: Self-pay

## 2021-03-06 ENCOUNTER — Encounter (INDEPENDENT_AMBULATORY_CARE_PROVIDER_SITE_OTHER): Payer: Self-pay | Admitting: Primary Care

## 2021-03-06 VITALS — BP 132/87 | HR 71 | Temp 97.5°F | Ht 72.0 in | Wt 190.0 lb

## 2021-03-06 DIAGNOSIS — Z131 Encounter for screening for diabetes mellitus: Secondary | ICD-10-CM

## 2021-03-06 DIAGNOSIS — N938 Other specified abnormal uterine and vaginal bleeding: Secondary | ICD-10-CM | POA: Diagnosis not present

## 2021-03-06 DIAGNOSIS — Z09 Encounter for follow-up examination after completed treatment for conditions other than malignant neoplasm: Secondary | ICD-10-CM

## 2021-03-06 DIAGNOSIS — R519 Headache, unspecified: Secondary | ICD-10-CM | POA: Diagnosis not present

## 2021-03-06 DIAGNOSIS — F411 Generalized anxiety disorder: Secondary | ICD-10-CM

## 2021-03-06 DIAGNOSIS — Z7689 Persons encountering health services in other specified circumstances: Secondary | ICD-10-CM

## 2021-03-06 LAB — POCT GLYCOSYLATED HEMOGLOBIN (HGB A1C): Hemoglobin A1C: 4.8 % (ref 4.0–5.6)

## 2021-03-06 MED ORDER — BUSPIRONE HCL 5 MG PO TABS: 5.0000 mg | ORAL_TABLET | Freq: Two times a day (BID) | ORAL | 2 refills | Status: DC

## 2021-03-06 NOTE — Patient Instructions (Addendum)
Buspirone Tablets What is this medication? BUSPIRONE (byoo SPYE rone) treats anxiety. It works by balancing the levels of dopamine and serotonin in your brain, hormones that help regulate mood. This medicine may be used for other purposes; ask your health care provider or pharmacist if you have questions. COMMON BRAND NAME(S): BuSpar What should I tell my care team before I take this medication? They need to know if you have any of these conditions: Kidney or liver disease An unusual or allergic reaction to buspirone, other medications, foods, dyes, or preservatives Pregnant or trying to get pregnant Breast-feeding How should I use this medication? Take this medication by mouth with a glass of water. Follow the directions on the prescription label. You may take this medication with or without food. To ensure that this medication always works the same way for you, you should take it either always with or always without food. Take your doses at regular intervals. Do not take your medication more often than directed. Do not stop taking except on the advice of your care team. Talk to your care team about the use of this medication in children. Special care may be needed. Overdosage: If you think you have taken too much of this medicine contact a poison control center or emergency room at once. NOTE: This medicine is only for you. Do not share this medicine with others. What if I miss a dose? If you miss a dose, take it as soon as you can. If it is almost time for your next dose, take only that dose. Do not take double or extra doses. What may interact with this medication? Do not take this medication with any of the following: Linezolid MAOIs like Carbex, Eldepryl, Marplan, Nardil, and Parnate Methylene blue Procarbazine This medication may also interact with the following: Diazepam Digoxin Diltiazem Erythromycin Grapefruit juice Haloperidol Medications for mental depression or mood  problems Medications for seizures like carbamazepine, phenobarbital and phenytoin Nefazodone Other medications for anxiety Rifampin Ritonavir Some antifungal medications like itraconazole, ketoconazole, and voriconazole Verapamil Warfarin This list may not describe all possible interactions. Give your health care provider a list of all the medicines, herbs, non-prescription drugs, or dietary supplements you use. Also tell them if you smoke, drink alcohol, or use illegal drugs. Some items may interact with your medicine. What should I watch for while using this medication? Visit your care team for regular checks on your progress. It may take 1 to 2 weeks before your anxiety gets better. You may get drowsy or dizzy. Do not drive, use machinery, or do anything that needs mental alertness until you know how this medication affects you. Do not stand or sit up quickly, especially if you are an older patient. This reduces the risk of dizzy or fainting spells. Alcohol can make you more drowsy and dizzy. Avoid alcoholic drinks. What side effects may I notice from receiving this medication? Side effects that you should report to your care team as soon as possible: Allergic reactions-skin rash, itching, hives, swelling of the face, lips, tongue, or throat Irritability, confusion, fast or irregular heartbeat, muscle stiffness, twitching muscles, sweating, high fever, seizure, chills, vomiting, diarrhea, which may be signs of serotonin syndrome Side effects that usually do not require medical attention (report to your care team if they continue or are bothersome): Anxiety or nervousness Dizziness Drowsiness Headache Nausea Trouble sleeping This list may not describe all possible side effects. Call your doctor for medical advice about side effects. You may report side effects  to FDA at 1-800-FDA-1088. Where should I keep my medication? Keep out of the reach of children. Store at room temperature below  30 degrees C (86 degrees F). Protect from light. Keep container tightly closed. Throw away any unused medication after the expiration date. NOTE: This sheet is a summary. It may not cover all possible information. If you have questions about this medicine, talk to your doctor, pharmacist, or health care provider.  2022 Elsevier/Gold Standard (2020-07-24 09:59:46) Iron rich foods such as shellfish,liver, organ meats(liver, gizzard), and red meats can increase cholesterol and should be consumed in moderation.However; legumes(beans), spinach, pumpkin seeds, [place], broccoli, tofu, green leafy vegetables and dark chocolate can be consumed without concern to cholesterol.

## 2021-03-06 NOTE — Telephone Encounter (Signed)
Copied from [place] [id number]. Topic: General - Other >> Mar 06, 2021 12:00 PM Holley Dexter N wrote: Reason for CRM: Pt called in about informing her decision on a medication Buspirone and decides she wanted to take it. Please advise  [facility] [phone] - [place], [address]  Phone:  [phone] Fax:  [phone]

## 2021-03-06 NOTE — Progress Notes (Signed)
[facility]   Subjective:  Caitlin Maddox is a 40 y.o. female presents for hospital follow up and establish care. Patient presented to the emergency room with buttocks abscess. She thought the abscess or bug bite on the back of her leg.  After a  week area remained and increased in size. .Admit date to the hospital was 02/01/21, patient was discharged from the hospital on 02/05/21, patient was admitted for: Cellulitis and abscess of buttock, Dysfunctional uterine bleeding and Cellulitis. She still has AUB and looking forward to seeing GYN and wants everything taken out "leave nothing,no use for it"  Past Medical History:  Diagnosis Date   Dysfunctional uterine bleeding    Symptomatic anemia      Allergies  Allergen Reactions   Clindamycin/Lincomycin Anaphylaxis and Swelling      Current Outpatient Medications on File Prior to Visit  Medication Sig Dispense Refill   ferrous sulfate (FERROUSUL) 325 (65 FE) MG tablet Take 1 tablet (325 mg total) by mouth 2 (two) times daily. 60 tablet 1   vitamin B-12 (CYANOCOBALAMIN) 500 MCG tablet Take 1 tablet (500 mcg total) by mouth daily. 30 tablet 1   acetaminophen (TYLENOL) 500 MG tablet Take 500-1,000 mg by mouth every 6 (six) hours as needed for mild pain or headache.     ibuprofen (ADVIL) 200 MG tablet Take 600 mg by mouth every 6 (six) hours as needed for mild pain, headache or cramping.     No current facility-administered medications on file prior to visit.   Review of System: Review of Systems  Gastrointestinal:  Positive for abdominal pain.  Neurological:  Positive for headaches.  Psychiatric/Behavioral:  The patient is nervous/anxious.   All other systems reviewed and are negative.  Objective:  BP 132/87 (BP Location: Right Arm, Patient Position: Sitting, Cuff Size: Normal)   Pulse 71   Temp (!) 97.5 F (36.4 C) (Temporal)   Ht 6' (1.829 m)   Wt 190 lb (86.2 kg)   LMP 02/24/2021 (Exact Date)   SpO2 100%   BMI  25.77 kg/m   Filed Weights   03/06/21 0828  Weight: 190 lb (86.2 kg)   Physical Exam: General Appearance: Well nourished, in no apparent distress. Eyes: PERRLA, EOMs, conjunctiva no swelling or erythema Sinuses: No Frontal/maxillary tenderness ENT/Mouth: Ext aud canals clear, TMs without erythema, bulging.  Hearing normal.  Neck: Supple, thyroid normal.  Respiratory: Respiratory effort normal, BS equal bilaterally without rales, rhonchi, wheezing or stridor.  Cardio: RRR with no MRGs. Brisk peripheral pulses without edema.  Abdomen: Soft, + BS.  Non tender, no guarding, rebound, hernias, masses. Lymphatics: Non tender without lymphadenopathy.  Musculoskeletal: Full ROM, 5/5 strength, normal gait.  Skin: Warm, dry without rashes, lesions, ecchymosis.  Neuro: Cranial nerves intact. Normal muscle tone, no cerebellar symptoms. Sensation intact.  Psych: Awake and oriented X 3, normal affect, Insight and Judgment appropriate.    Assessment:  [name] was seen today for hospitalization follow-up.  Diagnoses and all orders for this visit:  Screening for diabetes mellitus -     HgB A1c 4.8 per ADA guidelines she is not pre or diabetic   Dysfunctional uterine bleeding Appt schedule with GYN  -     CBC with Differential/Platelet; Future ( anemic)   Generalized anxiety disorder Discussed options and medications and that her job was the stressor, anxiety  and headaches. Told her I would provide information on buspar and after she read nformation on AVS called for an appt to prescribed medication.  Patient called back to the office within an hour asking to prescribed medication. -     busPIRone (BUSPAR) 5 MG tablet; Take 1 tablet (5 mg total) by mouth 2 (two) times daily.  Encounter to establish care Establish care with PCP  Frequent headaches Denies  aura-dizziness, nausea, sensitive to light sound or smell.  Denies triggers can be caused by drinking alcohol smoking or certain medications,  eating and drinking certain products  This condition may be triggered or caused by: Caffeine, aged cheese, and chocolate. Did admit to drinking 3-4 energy drinks- cause of headaches and anxiety to much stimulant . She will decrease to 1 daily only when working.   Hospital discharge follow-up  Follow up with [place] [facility] on 03/06/2021 seen   This note has been created with Dragon speech recognition software and Engineer, materials. Any transcriptional errors are unintentional.   Kerin Perna, NP 03/06/2021, 8:49 AM

## 2021-04-02 ENCOUNTER — Other Ambulatory Visit: Payer: Self-pay

## 2021-04-02 ENCOUNTER — Emergency Department (HOSPITAL_COMMUNITY)
Admission: EM | Admit: 2021-04-02 | Discharge: 2021-04-02 | Disposition: A | Discharge status: Home / Self Care | Payer: PRIVATE HEALTH INSURANCE | Attending: Emergency Medicine | Admitting: Emergency Medicine

## 2021-04-02 ENCOUNTER — Encounter (HOSPITAL_COMMUNITY): Payer: Self-pay | Admitting: Emergency Medicine

## 2021-04-02 ENCOUNTER — Emergency Department (HOSPITAL_COMMUNITY): Payer: PRIVATE HEALTH INSURANCE

## 2021-04-02 ENCOUNTER — Ambulatory Visit
Admission: EM | Admit: 2021-04-02 | Discharge: 2021-04-02 | Disposition: A | Discharge status: Home / Self Care | Payer: PRIVATE HEALTH INSURANCE

## 2021-04-02 ENCOUNTER — Encounter: Payer: Self-pay | Admitting: Emergency Medicine

## 2021-04-02 DIAGNOSIS — R102 Pelvic and perineal pain: Secondary | ICD-10-CM

## 2021-04-02 DIAGNOSIS — R1030 Lower abdominal pain, unspecified: Secondary | ICD-10-CM | POA: Insufficient documentation

## 2021-04-02 DIAGNOSIS — Z87891 Personal history of nicotine dependence: Secondary | ICD-10-CM | POA: Insufficient documentation

## 2021-04-02 DIAGNOSIS — B9689 Other specified bacterial agents as the cause of diseases classified elsewhere: Secondary | ICD-10-CM

## 2021-04-02 DIAGNOSIS — R109 Unspecified abdominal pain: Secondary | ICD-10-CM

## 2021-04-02 DIAGNOSIS — N838 Other noninflammatory disorders of ovary, fallopian tube and broad ligament: Secondary | ICD-10-CM

## 2021-04-02 LAB — COMPREHENSIVE METABOLIC PANEL
ALT: 23 U/L (ref 0–44)
AST: 20 U/L (ref 15–41)
Albumin: 3.1 g/dL — ABNORMAL LOW (ref 3.5–5.0)
Alkaline Phosphatase: 106 U/L (ref 38–126)
Anion gap: 12 (ref 5–15)
BUN: <5 mg/dL — ABNORMAL LOW (ref 6–20)
CO2: 23 mmol/L (ref 22–32)
Calcium: 8.7 mg/dL — ABNORMAL LOW (ref 8.9–10.3)
Chloride: 96 mmol/L — ABNORMAL LOW (ref 98–111)
Creatinine, Ser: 0.73 mg/dL (ref 0.44–1.00)
GFR, Estimated: >60 mL/min (ref >60)
Glucose, Bld: 115 mg/dL — ABNORMAL HIGH (ref 70–99)
Potassium: 3.1 mmol/L — ABNORMAL LOW (ref 3.5–5.1)
Sodium: 131 mmol/L — ABNORMAL LOW (ref 135–145)
Total Bilirubin: 0.6 mg/dL (ref 0.3–1.2)
Total Protein: 8 g/dL (ref 6.5–8.1)

## 2021-04-02 LAB — TYPE AND SCREEN
ABO/RH(D): A POS
Antibody Screen: NEGATIVE

## 2021-04-02 LAB — URINALYSIS, ROUTINE W REFLEX MICROSCOPIC
Bilirubin Urine: NEGATIVE
Glucose, UA: NEGATIVE mg/dL
Hgb urine dipstick: NEGATIVE
Ketones, ur: NEGATIVE mg/dL
Nitrite: NEGATIVE
Protein, ur: NEGATIVE mg/dL
Specific Gravity, Urine: 1.01 (ref 1.005–1.030)
pH: 6 (ref 5.0–8.0)

## 2021-04-02 LAB — CBC
HCT: 30.6 % — ABNORMAL LOW (ref 36.0–46.0)
Hemoglobin: 9.5 g/dL — ABNORMAL LOW (ref 12.0–15.0)
MCH: 28.4 pg (ref 26.0–34.0)
MCHC: 31 g/dL (ref 30.0–36.0)
MCV: 91.3 fL (ref 80.0–100.0)
Platelets: 225 10*3/uL (ref 150–400)
RBC: 3.35 MIL/uL — ABNORMAL LOW (ref 3.87–5.11)
RDW: 21.9 % — ABNORMAL HIGH (ref 11.5–15.5)
WBC: 8.8 10*3/uL (ref 4.0–10.5)
nRBC: 0 % (ref 0.0–0.2)

## 2021-04-02 LAB — WET PREP, GENITAL
Sperm: NONE SEEN
Trich, Wet Prep: NONE SEEN
WBC, Wet Prep HPF POC: <10 (ref <10)
Yeast Wet Prep HPF POC: NONE SEEN

## 2021-04-02 LAB — LIPASE, BLOOD: Lipase: 20 U/L (ref 11–51)

## 2021-04-02 LAB — I-STAT BETA HCG BLOOD, ED (MC, WL, AP ONLY): I-stat hCG, quantitative: <5 m[IU]/mL (ref <5)

## 2021-04-02 MED ORDER — SODIUM CHLORIDE 0.9 % IV BOLUS
1000.0000 mL | Freq: Once | INTRAVENOUS | Status: AC
  Administered 2021-04-02: 1000 mL via INTRAVENOUS

## 2021-04-02 MED ORDER — MORPHINE SULFATE (PF) 4 MG/ML IV SOLN
4.0000 mg | Freq: Once | INTRAVENOUS | Status: AC
  Administered 2021-04-02: 4 mg via INTRAVENOUS
  Filled 2021-04-02: qty 1

## 2021-04-02 MED ORDER — POTASSIUM CHLORIDE CRYS ER 20 MEQ PO TBCR
20.0000 meq | EXTENDED_RELEASE_TABLET | Freq: Once | ORAL | Status: AC
  Administered 2021-04-02: 20 meq via ORAL
  Filled 2021-04-02: qty 1

## 2021-04-02 MED ORDER — HYDROCODONE-ACETAMINOPHEN 5-325 MG PO TABS: 1.0000 | ORAL_TABLET | Freq: Four times a day (QID) | ORAL | 0 refills | Status: DC | PRN

## 2021-04-02 MED ORDER — KETOROLAC TROMETHAMINE 15 MG/ML IJ SOLN
15.0000 mg | Freq: Once | INTRAMUSCULAR | Status: AC
  Administered 2021-04-02: 15 mg via INTRAVENOUS
  Filled 2021-04-02: qty 1

## 2021-04-02 MED ORDER — METRONIDAZOLE 500 MG PO TABS: 500.0000 mg | ORAL_TABLET | Freq: Two times a day (BID) | ORAL | 0 refills | Status: DC

## 2021-04-02 NOTE — ED Notes (Signed)
Unsuccessful IV attempt x2.  

## 2021-04-02 NOTE — ED Triage Notes (Signed)
Patient reports nausea and lower abdominal pain onset of Friday. States pain across lower stomach but worse on the right side. Reports hx of fibroids and anemia. Had minor vaginal bleeding Friday- Sunday but none since then.

## 2021-04-02 NOTE — ED Notes (Signed)
Patient transported to Ultrasound 

## 2021-04-02 NOTE — ED Triage Notes (Signed)
Hx of uterine fibroids with heavy periods, and anemia. Started her period on the 13th. Friday went to sleep, woke up and couldn't walk due to pain in lower abdomin that radiates into back. Occasionally has a hard time even sitting down due to pain.

## 2021-04-02 NOTE — ED Provider Notes (Signed)
[facility] EM[facility]ENC[facility] DEPARTMENT Provider Note   CSN: [id number] Arrival date & time: 04/02/21  1751     History Chief Complaint  Patient presents with   Abdominal Pain    Caitlin Maddox is a 40 y.o. female with a past medical history of dysfunctional uterine bleeding and anemia presenting today from urgent care due to waxing and waning abdominal pain over the last week.  She describes this pain as sharp and crampy and over her suprapubic area.  Reports that she has been nauseous however has not been vomiting. No changes in her bowel movements.  Denies urinary symptoms.  Last menstrual period last week and regular.  Endorsed heavy periods however this is her baseline.  Has a follow-up with OB/GYN next week.  No history of IBS/IBD.  Does not appear to be associated with any foods.  No back pain or history of kidney stones.  Past Medical History:  Diagnosis Date   Dysfunctional uterine bleeding    Symptomatic anemia     Patient Active Problem List   Diagnosis Date Noted   Cellulitis 02/02/2021   Cellulitis and abscess of buttock 02/01/2021   Dysfunctional uterine bleeding 02/01/2021   Symptomatic anemia 10/04/2020    Past Surgical History:  Procedure Laterality Date   CESAREAN SECTION       OB History   No obstetric history on file.     No family history on file.  Social History   Tobacco Use   Smoking status: Former    Packs/day: 0.50    Types: Cigarettes    Quit date: 10/09/2020    Years since quitting: 0.4   Smokeless tobacco: Never  Substance Use Topics   Alcohol use: No   Drug use: No    Home Medications Prior to Admission medications   Medication Sig Start Date End Date Taking? Authorizing Provider  acetaminophen (TYLENOL) 500 MG tablet Take 500-1,000 mg by mouth every 6 (six) hours as needed for mild pain or headache.    [provider]  busPIRone (BUSPAR) 5 MG tablet Take 1 tablet (5 mg total) by mouth 2 (two) times daily.  03/06/21   Kerin Perna, NP  ferrous sulfate (FERROUSUL) 325 (65 FE) MG tablet Take 1 tablet (325 mg total) by mouth 2 (two) times daily. 02/05/21   Patrecia Pour, MD  ibuprofen (ADVIL) 200 MG tablet Take 600 mg by mouth every 6 (six) hours as needed for mild pain, headache or cramping.    [provider]  vitamin B-12 (CYANOCOBALAMIN) 500 MCG tablet Take 1 tablet (500 mcg total) by mouth daily. 02/06/21   Patrecia Pour, MD    Allergies    Clindamycin/lincomycin  Review of Systems   Review of Systems  Constitutional:  Negative for chills and fever.  Gastrointestinal:  Positive for abdominal pain and nausea. Negative for constipation, diarrhea and vomiting.  Genitourinary:  Positive for menstrual problem. Negative for dysuria, frequency, hematuria, vaginal bleeding, vaginal discharge and vaginal pain.  Musculoskeletal:  Negative for back pain.  All other systems reviewed and are negative.  Physical Exam Updated Vital Signs BP 118/73   Pulse (!) 101   Temp 98.4 F (36.9 C)   Resp 14   Ht 6' (1.829 m)   Wt 86.2 kg   LMP 03/28/2021   SpO2 99%   BMI 25.77 kg/m   Physical Exam Vitals and nursing note reviewed.  Constitutional:      General: She is not in acute distress.  Appearance: Normal appearance.  HENT:     Head: Normocephalic and atraumatic.  Eyes:     General: No scleral icterus.    Conjunctiva/sclera: Conjunctivae normal.  Cardiovascular:     Rate and Rhythm: Normal rate and regular rhythm.  Pulmonary:     Effort: Pulmonary effort is normal. No respiratory distress.     Breath sounds: No wheezing or rales.  Abdominal:     General: Abdomen is flat. There is no distension.     Palpations: Abdomen is soft.     Tenderness: There is abdominal tenderness in the suprapubic area. There is no right CVA tenderness or left CVA tenderness. Negative signs include McBurney's sign.     Comments: Suprapubic mass about with palpation.  Skin:    General: Skin is  warm and dry.     Findings: No rash.  Neurological:     Mental Status: She is alert.  Psychiatric:        Mood and Affect: Mood normal.        Behavior: Behavior normal.    ED Results / Procedures / Treatments   Labs (all labs ordered are listed, but only abnormal results are displayed) Labs Reviewed  COMPREHENSIVE METABOLIC PANEL - Abnormal; Notable for the following components:      Result Value   Sodium 131 (*)    Potassium 3.1 (*)    Chloride 96 (*)    Glucose, Bld 115 (*)    BUN <5 (*)    Calcium 8.7 (*)    Albumin 3.1 (*)    All other components within normal limits  CBC - Abnormal; Notable for the following components:   RBC 3.35 (*)    Hemoglobin 9.5 (*)    HCT 30.6 (*)    RDW 21.9 (*)    All other components within normal limits  URINALYSIS, ROUTINE W REFLEX MICROSCOPIC - Abnormal; Notable for the following components:   Leukocytes,Ua MODERATE (*)    Bacteria, UA RARE (*)    All other components within normal limits  LIPASE, BLOOD  I-STAT BETA HCG BLOOD, ED ([facility], [facility], [facility] ONLY)  TYPE AND SCREEN    EKG None  Radiology No results found.  Procedures Procedures   Medications Ordered in ED Medications  sodium chloride 0.9 % bolus 1,000 mL (has no administration in time range)  morphine 4 MG/ML injection 4 mg (has no administration in time range)  potassium chloride SA (KLOR-CON) CR tablet 20 mEq (20 mEq Oral Given 04/02/21 1306)    ED Course  I have reviewed the triage vital signs and the nursing notes.  Pertinent labs & imaging results that were available during my care of the patient were reviewed by me and considered in my medical decision making (see chart for details).    MDM Rules/Calculators/A&P The differential diagnosis for generalized abdominal pain includes, but is not limited to AAA, gastroenteritis, appendicitis, Bowel obstruction, Bowel perforation. Gastroparesis, DKA, Hernia, Inflammatory bowel disease, mesenteric ischemia, pancreatitis,  peritonitis SBP, volvulus, torsion/ectopic pregnancy.  All of these were considered throughout the evaluation of this patient.  Her pain was localized to the suprapubic area.  No other tenderness or distention.  Denies urinary symptoms and no abnormal vaginal discharge/bleeding.  Urgent care sent her for a further evaluation of possible complications of fibroids.  She reported she has never been diagnosed with fibroids.  She does have a follow-up with GYN next week.  Because she had no other tenderness or symptoms, CT scan deferred and pelvic  ultrasound ordered.  Results of this scan pending at shift change.  Patient signed out to [place].   Final Clinical Impression(s) / ED Diagnoses Final diagnoses:  Abdominal pain    Rx / DC Orders Signed out at shift change.     Darliss Ridgel 04/02/21 1621    Gareth Morgan, MD 04/03/21 [phone]

## 2021-04-02 NOTE — ED Provider Notes (Signed)
[name]    CSN: [id number] Arrival date & time: 04/02/21  0816      History   Chief Complaint Chief Complaint  Patient presents with   Abdominal Pain    HPI Caitlin Maddox is a 40 y.o. female.   Patient presents with severe, intermittent lower abdominal pain that started approximately 6 days ago.  Patient reports that abdominal pain has lessened in severity since start of symptoms but that is still rated 8/10 on pain scale.  Pain is intermittent and is described as a "sharp, shooting pain".  Patient does report nausea but denies vomiting, diarrhea, constipation.  Having regular bowel movements and denies blood in stool.  Patient reports that she does have history of fibroids which was recently diagnosed a few months prior.  Patient has been hospitalized twice, once in September and once in October for anemia where she received multiple units of blood during hospitalization.  Patient reports that this "feels similar" to those complications. Denies chest pain, shortness of breath, any upper respiratory symptoms.  Patient does report that she had a fever of 101 approximately 3 days prior.  Denies any urinary burning, urinary frequency, vaginal discharge.  Patient does report that she just recently completed her menstrual cycle and that she had heavy vaginal bleeding which has been baseline for her over the past couple of months since being diagnosed with fibroids. Denies chance of pregnancy.    Abdominal Pain  Past Medical History:  Diagnosis Date   Dysfunctional uterine bleeding    Symptomatic anemia     Patient Active Problem List   Diagnosis Date Noted   Cellulitis 02/02/2021   Cellulitis and abscess of buttock 02/01/2021   Dysfunctional uterine bleeding 02/01/2021   Symptomatic anemia 10/04/2020    Past Surgical History:  Procedure Laterality Date   CESAREAN SECTION      OB History   No obstetric history on file.      Home Medications    Prior to  Admission medications   Medication Sig Start Date End Date Taking? Authorizing Provider  acetaminophen (TYLENOL) 500 MG tablet Take 500-1,000 mg by mouth every 6 (six) hours as needed for mild pain or headache.    [provider]  busPIRone (BUSPAR) 5 MG tablet Take 1 tablet (5 mg total) by mouth 2 (two) times daily. 03/06/21   Kerin Perna, NP  ferrous sulfate (FERROUSUL) 325 (65 FE) MG tablet Take 1 tablet (325 mg total) by mouth 2 (two) times daily. 02/05/21   Patrecia Pour, MD  ibuprofen (ADVIL) 200 MG tablet Take 600 mg by mouth every 6 (six) hours as needed for mild pain, headache or cramping.    [provider]  vitamin B-12 (CYANOCOBALAMIN) 500 MCG tablet Take 1 tablet (500 mcg total) by mouth daily. 02/06/21   Patrecia Pour, MD    Family History History reviewed. No pertinent family history.  Social History Social History   Tobacco Use   Smoking status: Former    Packs/day: 0.50    Types: Cigarettes    Quit date: 10/09/2020    Years since quitting: 0.4   Smokeless tobacco: Never  Substance Use Topics   Alcohol use: No   Drug use: No     Allergies   Clindamycin/lincomycin   Review of Systems Review of Systems Per HPI  Physical Exam Triage Vital Signs ED Triage Vitals  Enc Vitals Group     BP 04/02/21 0852 110/75     Pulse Rate  04/02/21 0852 (!) 107     Resp 04/02/21 0852 16     Temp 04/02/21 0852 99.2 F (37.3 C)     Temp Source 04/02/21 0852 Oral     SpO2 04/02/21 0852 98 %     Weight --      Height --      Head Circumference --      Peak Flow --      Pain Score 04/02/21 0855 8     Pain Loc --      Pain Edu? --      Excl. in [place]? --    No data found.  Updated Vital Signs BP 110/75 (BP Location: Left Arm)   Pulse (!) 107   Temp 99.2 F (37.3 C) (Oral)   Resp 16   SpO2 98%   Visual Acuity Right Eye Distance:   Left Eye Distance:   Bilateral Distance:    Right Eye Near:   Left Eye Near:    Bilateral Near:      Physical Exam Constitutional:      General: She is not in acute distress.    Appearance: Normal appearance. She is ill-appearing. She is not toxic-appearing or diaphoretic.  HENT:     Head: Normocephalic and atraumatic.  Eyes:     Extraocular Movements: Extraocular movements intact.     Conjunctiva/sclera: Conjunctivae normal.  Cardiovascular:     Rate and Rhythm: Normal rate and regular rhythm.     Heart sounds: Normal heart sounds.  Pulmonary:     Effort: Pulmonary effort is normal. No respiratory distress.     Breath sounds: Normal breath sounds. No wheezing.  Abdominal:     General: Abdomen is flat. Bowel sounds are normal. There is no distension.     Tenderness: There is abdominal tenderness in the suprapubic area.     Comments: Hard palpable area to suprapubic region.   Skin:    General: Skin is warm and dry.  Neurological:     General: No focal deficit present.     Mental Status: She is alert and oriented to person, place, and time. Mental status is at baseline.  Psychiatric:        Mood and Affect: Mood normal.        Behavior: Behavior normal.        Thought Content: Thought content normal.        Judgment: Judgment normal.     UC Treatments / Results  Labs (all labs ordered are listed, but only abnormal results are displayed) Labs Reviewed - No data to display  EKG   Radiology No results found.  Procedures Procedures (including critical care time)  Medications Ordered in UC Medications - No data to display  Initial Impression / Assessment and Plan / UC Course  I have reviewed the triage vital signs and the nursing notes.  Pertinent labs & imaging results that were available during my care of the patient were reviewed by me and considered in my medical decision making (see chart for details).     Patient was sent to the hospital for further evaluation and management of severe abdominal pain.  Suspicious of complications with fibroids, and patient  is high risk due to multiple hospitalization recently with low hemoglobin.  Vital signs stable at discharge.  Agree with patient self transport to the hospital. Final Clinical Impressions(s) / UC Diagnoses   Final diagnoses:  Lower abdominal pain     Discharge Instructions  Please go to the hospital as soon as you leave urgent care for further evaluation and management.     ED Prescriptions   None    PDMP not reviewed this encounter.   Teodora Medici, [place] 04/02/21 [phone]

## 2021-04-02 NOTE — Discharge Instructions (Signed)
Please go to the hospital as soon as you leave urgent care for further evaluation and management. 

## 2021-04-02 NOTE — Discharge Instructions (Addendum)
I have prescribed you a strong narcotic called Vicodin. Please only take this as prescribed. This medication also has tylenol in it, so please be sure you are not taking more than 3000 mg of tylenol per day. Do not drive or operate heavy machinery after taking this medication. Do not mix it with alcohol.   I have also prescribed you an antibiotic called Flagyl.  Please take this twice a day for the next 7 days.  Do not stop taking this antibiotic early.  Your ultrasound was concerning for a complex cyst on your left ovary.  It is very important that you follow-up with gynecology regarding this.  If you develop any new or worsening symptoms or unable to be evaluated by gynecology in a timely manner, please return to the emergency department immediately for reevaluation.

## 2021-04-02 NOTE — ED Provider Notes (Signed)
Patient is a 40 year old female whose care was transferred to me at shift change from [place] PA-C.  Her HPI is below:  Caitlin Maddox is a 40 y.o. female with a past medical history of dysfunctional uterine bleeding and anemia presenting today from urgent care due to waxing and waning abdominal pain over the last week.  She describes this pain as sharp and crampy and over her suprapubic area.  Reports that she has been nauseous however has not been vomiting. No changes in her bowel movements.  Denies urinary symptoms.  Last menstrual period last week and regular.  Endorsed heavy periods however this is her baseline.  Has a follow-up with OB/GYN next week.  No history of IBS/IBD.  Does not appear to be associated with any foods.  No back pain or history of kidney stones.  Physical Exam  BP 119/72   Pulse (!) 103   Temp 98.4 F (36.9 C)   Resp 12   Ht 6' (1.829 m)   Wt 86.2 kg   LMP 03/28/2021   SpO2 100%   BMI 25.77 kg/m   Physical Exam Vitals and nursing note reviewed.  Constitutional:      General: She is not in acute distress.    Appearance: Normal appearance. She is not ill-appearing, toxic-appearing or diaphoretic.  HENT:     Head: Normocephalic and atraumatic.     Right Ear: External ear normal.     Left Ear: External ear normal.     Nose: Nose normal.     Mouth/Throat:     Mouth: Mucous membranes are moist.     Pharynx: Oropharynx is clear. No oropharyngeal exudate or posterior oropharyngeal erythema.  Eyes:     Extraocular Movements: Extraocular movements intact.  Cardiovascular:     Rate and Rhythm: Normal rate and regular rhythm.     Pulses: Normal pulses.     Heart sounds: Normal heart sounds. No murmur heard.   No friction rub. No gallop.  Pulmonary:     Effort: Pulmonary effort is normal. No respiratory distress.     Breath sounds: Normal breath sounds. No stridor. No wheezing, rhonchi or rales.  Abdominal:     General: Abdomen is flat.     Palpations: Abdomen is  soft.     Tenderness: There is abdominal tenderness.     Comments: Abdomen is flat and soft.  Moderate tenderness noted diffusely in the pelvic region.  Genitourinary:    Comments: Female nursing chaperone present.  Normal-appearing vulvar anatomy.  Normal-appearing vaginal mucosa.  Closed cervical os.  Small amount of green discharge noted in the vaginal vault.  No cervical motion tenderness.  Mild left-sided adnexal tenderness.  No right-sided adnexal tenderness. Musculoskeletal:        General: Normal range of motion.     Cervical back: Normal range of motion and neck supple. No tenderness.  Skin:    General: Skin is warm and dry.  Neurological:     General: No focal deficit present.     Mental Status: She is alert and oriented to person, place, and time.  Psychiatric:        Mood and Affect: Mood normal.        Behavior: Behavior normal.   ED Course/Procedures   Clinical Course as of 04/02/21 2059  Wed Apr 02, 2021  1622 Hemoglobin(!): 9.5 Appears to be improved slightly compared to patient's baseline. [LJ]  [id number] Patient discussed with gynecology.  Patient actually has a follow-up appointment in 70  days.  They recommend that we perform a pelvic exam and obtain a GC/chlamydia as well as a wet prep.  States that patient can follow-up outpatient and return to the emergency department with any new or worsening symptoms. [LJ]  2051 Clue Cells Wet Prep HPF POC(!): PRESENT [LJ]    Clinical Course User Index [LJ] Rayna Sexton, PA-C    Procedures  MDM  Patient is a 40 year old female whose care was transferred me at shift change from [place] PA-C.  Please see her note below for additional information.  In summary, patient has a history of menorrhagia and states that her symptoms have been progressively worsening over the past year.  Denies any shortness of breath or syncope but does note some intermittent lightheadedness.  She states that since having her last menstrual cycle last week  she continued to have pelvic pain which has improved mildly but not resolved so she went to urgent care today and was ultimately sent to the emergency department for further evaluation.  At the time of shift change patient's lab work had resulted but was pending a pelvic ultrasound.  This has since resulted with findings as noted below:  IMPRESSION:  1. Large complex cystic mass in the left adnexa extending to the  posterior cul-de-sac measuring at least 11.4 cm and probably  containing peripheral vascular soft tissue, raising concern for  ovarian neoplasm. Gynecology consultation recommended with potential  evaluation with MRI.  2. The ovaries are imaged as separate structures and demonstrate no  definitive evidence for torsion  3. The uterus is poorly evaluated secondary to the large pelvic  mass. The IUD is not well visualized.  4. Endometrial thickness of 21.2 mm. Endometrial thickness is  considered abnormal. Consider follow-up by [place] in 6-8 weeks, during  the week immediately following menses (exam timing is critical).   Patient discussed with gynecology who recommend that she follow-up outpatient.  She actually has an upcoming appointment with gynecology.  They do recommend that we obtain a GC/chlamydia as well as a wet prep.  Patient notes that she has had 1 female sexual partner for the past 6 months but does note that "they break up regularly" and she is unsure if he has had any other sexual partners.  She denies any dysuria or increased vaginal discharge.  Pelvic exam performed with findings as noted above in the physical exam.  Wet prep resulted which is positive for clue cells.  Will treat with Flagyl.  Feel that the patient is stable for discharge at this time and she is agreeable.  Will discharge on a course of Vicodin for breakthrough pain.  We discussed safety regarding this medication.  Discussed return precautions.        Rayna Sexton, PA-C 04/02/21 2100     Lennice Sites, DO 04/02/21 2202

## 2021-04-04 LAB — GC/CHLAMYDIA PROBE AMP (~~LOC~~) NOT AT ARMC
Chlamydia: NEGATIVE
Comment: NEGATIVE
Comment: NORMAL
Neisseria Gonorrhea: NEGATIVE

## 2021-04-06 ENCOUNTER — Emergency Department (HOSPITAL_COMMUNITY): Payer: PRIVATE HEALTH INSURANCE

## 2021-04-06 ENCOUNTER — Inpatient Hospital Stay (HOSPITAL_COMMUNITY)
Admission: EM | Admit: 2021-04-06 | Discharge: 2021-04-11 | DRG: 988 | Disposition: A | Discharge status: Home Health | Payer: PRIVATE HEALTH INSURANCE | Attending: Internal Medicine | Admitting: Internal Medicine

## 2021-04-06 ENCOUNTER — Other Ambulatory Visit: Payer: Self-pay

## 2021-04-06 ENCOUNTER — Encounter (HOSPITAL_COMMUNITY): Payer: Self-pay | Admitting: *Deleted

## 2021-04-06 DIAGNOSIS — N939 Abnormal uterine and vaginal bleeding, unspecified: Secondary | ICD-10-CM

## 2021-04-06 DIAGNOSIS — N739 Female pelvic inflammatory disease, unspecified: Secondary | ICD-10-CM | POA: Diagnosis present

## 2021-04-06 DIAGNOSIS — Z30432 Encounter for removal of intrauterine contraceptive device: Secondary | ICD-10-CM | POA: Diagnosis not present

## 2021-04-06 DIAGNOSIS — R1900 Intra-abdominal and pelvic swelling, mass and lump, unspecified site: Principal | ICD-10-CM | POA: Diagnosis present

## 2021-04-06 DIAGNOSIS — Z79899 Other long term (current) drug therapy: Secondary | ICD-10-CM

## 2021-04-06 DIAGNOSIS — E441 Mild protein-calorie malnutrition: Secondary | ICD-10-CM | POA: Diagnosis present

## 2021-04-06 DIAGNOSIS — R509 Fever, unspecified: Secondary | ICD-10-CM

## 2021-04-06 DIAGNOSIS — R161 Splenomegaly, not elsewhere classified: Secondary | ICD-10-CM | POA: Diagnosis present

## 2021-04-06 DIAGNOSIS — Y762 Prosthetic and other implants, materials and accessory obstetric and gynecological devices associated with adverse incidents: Secondary | ICD-10-CM | POA: Diagnosis present

## 2021-04-06 DIAGNOSIS — R599 Enlarged lymph nodes, unspecified: Secondary | ICD-10-CM

## 2021-04-06 DIAGNOSIS — N920 Excessive and frequent menstruation with regular cycle: Secondary | ICD-10-CM | POA: Diagnosis present

## 2021-04-06 DIAGNOSIS — D250 Submucous leiomyoma of uterus: Secondary | ICD-10-CM | POA: Diagnosis present

## 2021-04-06 DIAGNOSIS — E46 Unspecified protein-calorie malnutrition: Principal | ICD-10-CM

## 2021-04-06 DIAGNOSIS — Z87891 Personal history of nicotine dependence: Secondary | ICD-10-CM

## 2021-04-06 DIAGNOSIS — N888 Other specified noninflammatory disorders of cervix uteri: Secondary | ICD-10-CM | POA: Diagnosis present

## 2021-04-06 DIAGNOSIS — R6510 Systemic inflammatory response syndrome (SIRS) of non-infectious origin without acute organ dysfunction: Secondary | ICD-10-CM | POA: Diagnosis present

## 2021-04-06 DIAGNOSIS — E871 Hypo-osmolality and hyponatremia: Secondary | ICD-10-CM | POA: Diagnosis present

## 2021-04-06 DIAGNOSIS — Z881 Allergy status to other antibiotic agents status: Secondary | ICD-10-CM

## 2021-04-06 DIAGNOSIS — Z20822 Contact with and (suspected) exposure to covid-19: Secondary | ICD-10-CM | POA: Diagnosis present

## 2021-04-06 DIAGNOSIS — D252 Subserosal leiomyoma of uterus: Secondary | ICD-10-CM | POA: Diagnosis present

## 2021-04-06 DIAGNOSIS — J9811 Atelectasis: Secondary | ICD-10-CM | POA: Diagnosis present

## 2021-04-06 DIAGNOSIS — Z6825 Body mass index (BMI) 25.0-25.9, adult: Secondary | ICD-10-CM

## 2021-04-06 DIAGNOSIS — T8332XA Displacement of intrauterine contraceptive device, initial encounter: Secondary | ICD-10-CM | POA: Diagnosis present

## 2021-04-06 DIAGNOSIS — E8809 Other disorders of plasma-protein metabolism, not elsewhere classified: Secondary | ICD-10-CM

## 2021-04-06 DIAGNOSIS — D500 Iron deficiency anemia secondary to blood loss (chronic): Secondary | ICD-10-CM | POA: Diagnosis present

## 2021-04-06 DIAGNOSIS — F411 Generalized anxiety disorder: Secondary | ICD-10-CM | POA: Diagnosis present

## 2021-04-06 LAB — I-STAT BETA HCG BLOOD, ED (MC, WL, AP ONLY): I-stat hCG, quantitative: <5 m[IU]/mL (ref <5)

## 2021-04-06 LAB — CBC WITH DIFFERENTIAL/PLATELET
Abs Immature Granulocytes: 0.12 10*3/uL — ABNORMAL HIGH (ref 0.00–0.07)
Basophils Absolute: 0 10*3/uL (ref 0.0–0.1)
Basophils Relative: 0 %
Eosinophils Absolute: 0 10*3/uL (ref 0.0–0.5)
Eosinophils Relative: 0 %
HCT: 28 % — ABNORMAL LOW (ref 36.0–46.0)
Hemoglobin: 9.1 g/dL — ABNORMAL LOW (ref 12.0–15.0)
Immature Granulocytes: 1 %
Lymphocytes Relative: 13 %
Lymphs Abs: 1.6 10*3/uL (ref 0.7–4.0)
MCH: 29.3 pg (ref 26.0–34.0)
MCHC: 32.5 g/dL (ref 30.0–36.0)
MCV: 90 fL (ref 80.0–100.0)
Monocytes Absolute: 1 10*3/uL (ref 0.1–1.0)
Monocytes Relative: 8 %
Neutro Abs: 9.8 10*3/uL — ABNORMAL HIGH (ref 1.7–7.7)
Neutrophils Relative %: 78 %
Platelets: 285 10*3/uL (ref 150–400)
RBC: 3.11 MIL/uL — ABNORMAL LOW (ref 3.87–5.11)
RDW: 19.7 % — ABNORMAL HIGH (ref 11.5–15.5)
WBC: 12.6 10*3/uL — ABNORMAL HIGH (ref 4.0–10.5)
nRBC: 0 % (ref 0.0–0.2)

## 2021-04-06 LAB — URINALYSIS, ROUTINE W REFLEX MICROSCOPIC
Bacteria, UA: NONE SEEN
Bilirubin Urine: NEGATIVE
Glucose, UA: NEGATIVE mg/dL
Ketones, ur: NEGATIVE mg/dL
Nitrite: NEGATIVE
Protein, ur: 30 mg/dL — AB
Specific Gravity, Urine: 1.021 (ref 1.005–1.030)
pH: 8 (ref 5.0–8.0)

## 2021-04-06 LAB — COMPREHENSIVE METABOLIC PANEL
ALT: 59 U/L — ABNORMAL HIGH (ref 0–44)
AST: 35 U/L (ref 15–41)
Albumin: 2.7 g/dL — ABNORMAL LOW (ref 3.5–5.0)
Alkaline Phosphatase: 146 U/L — ABNORMAL HIGH (ref 38–126)
Anion gap: 9 (ref 5–15)
BUN: 5 mg/dL — ABNORMAL LOW (ref 6–20)
CO2: 29 mmol/L (ref 22–32)
Calcium: 8.6 mg/dL — ABNORMAL LOW (ref 8.9–10.3)
Chloride: 90 mmol/L — ABNORMAL LOW (ref 98–111)
Creatinine, Ser: 0.7 mg/dL (ref 0.44–1.00)
GFR, Estimated: >60 mL/min (ref >60)
Glucose, Bld: 113 mg/dL — ABNORMAL HIGH (ref 70–99)
Potassium: 3.8 mmol/L (ref 3.5–5.1)
Sodium: 128 mmol/L — ABNORMAL LOW (ref 135–145)
Total Bilirubin: 0.5 mg/dL (ref 0.3–1.2)
Total Protein: 8.1 g/dL (ref 6.5–8.1)

## 2021-04-06 LAB — LIPASE, BLOOD: Lipase: 34 U/L (ref 11–51)

## 2021-04-06 LAB — LACTIC ACID, PLASMA
Lactic Acid, Venous: 0.9 mmol/L (ref 0.5–1.9)
Lactic Acid, Venous: 0.7 mmol/L (ref 0.5–1.9)

## 2021-04-06 LAB — RESP PANEL BY RT-PCR (FLU A&B, COVID) ARPGX2
Influenza A by PCR: NEGATIVE
Influenza B by PCR: NEGATIVE
SARS Coronavirus 2 by RT PCR: NEGATIVE

## 2021-04-06 MED ORDER — PIPERACILLIN-TAZOBACTAM 3.375 G IVPB
3.3750 g | Freq: Three times a day (TID) | INTRAVENOUS | Status: DC
  Administered 2021-04-06 – 2021-04-10 (×11): 3.375 g via INTRAVENOUS
  Filled 2021-04-06 (×11): qty 50

## 2021-04-06 MED ORDER — SODIUM CHLORIDE 0.9 % IV SOLN: INTRAVENOUS | Status: AC

## 2021-04-06 MED ORDER — SODIUM CHLORIDE 0.9 % IV BOLUS
1000.0000 mL | Freq: Once | INTRAVENOUS | Status: AC
  Administered 2021-04-06: 20:00:00 1000 mL via INTRAVENOUS

## 2021-04-06 MED ORDER — ONDANSETRON HCL 4 MG/2ML IJ SOLN
4.0000 mg | Freq: Once | INTRAMUSCULAR | Status: AC
  Administered 2021-04-06: 20:00:00 4 mg via INTRAVENOUS
  Filled 2021-04-06: qty 2

## 2021-04-06 MED ORDER — ACETAMINOPHEN 650 MG RE SUPP: 650.0000 mg | Freq: Four times a day (QID) | RECTAL | Status: DC | PRN

## 2021-04-06 MED ORDER — HYDROCODONE-ACETAMINOPHEN 5-325 MG PO TABS
1.0000 | ORAL_TABLET | ORAL | Status: DC | PRN
  Administered 2021-04-07 – 2021-04-11 (×21): 2 via ORAL
  Filled 2021-04-06 (×21): qty 2

## 2021-04-06 MED ORDER — MORPHINE SULFATE (PF) 2 MG/ML IV SOLN
2.0000 mg | INTRAVENOUS | Status: AC | PRN
  Administered 2021-04-06 – 2021-04-07 (×2): 2 mg via INTRAVENOUS
  Filled 2021-04-06 (×2): qty 1

## 2021-04-06 MED ORDER — BUSPIRONE HCL 5 MG PO TABS
5.0000 mg | ORAL_TABLET | Freq: Two times a day (BID) | ORAL | Status: DC
  Administered 2021-04-06 – 2021-04-11 (×10): 5 mg via ORAL
  Filled 2021-04-06 (×10): qty 1

## 2021-04-06 MED ORDER — IOHEXOL 300 MG/ML  SOLN
80.0000 mL | Freq: Once | INTRAMUSCULAR | Status: AC | PRN
  Administered 2021-04-06: 19:00:00 80 mL via INTRAVENOUS

## 2021-04-06 MED ORDER — ACETAMINOPHEN 325 MG PO TABS
650.0000 mg | ORAL_TABLET | Freq: Four times a day (QID) | ORAL | Status: DC | PRN
  Administered 2021-04-07 (×3): 650 mg via ORAL
  Filled 2021-04-06 (×3): qty 2

## 2021-04-06 MED ORDER — ONDANSETRON HCL 4 MG/2ML IJ SOLN: 4.0000 mg | Freq: Four times a day (QID) | INTRAMUSCULAR | Status: DC | PRN

## 2021-04-06 MED ORDER — ACETAMINOPHEN 325 MG PO TABS
650.0000 mg | ORAL_TABLET | Freq: Once | ORAL | Status: AC
  Administered 2021-04-06: 20:00:00 650 mg via ORAL
  Filled 2021-04-06: qty 2

## 2021-04-06 MED ORDER — ONDANSETRON HCL 4 MG PO TABS
4.0000 mg | ORAL_TABLET | Freq: Four times a day (QID) | ORAL | Status: DC | PRN
  Administered 2021-04-07: 12:00:00 4 mg via ORAL
  Filled 2021-04-06: qty 1

## 2021-04-06 MED ORDER — MORPHINE SULFATE (PF) 4 MG/ML IV SOLN
4.0000 mg | Freq: Once | INTRAVENOUS | Status: AC
  Administered 2021-04-06: 20:00:00 4 mg via INTRAVENOUS
  Filled 2021-04-06: qty 1

## 2021-04-06 MED ORDER — SENNOSIDES-DOCUSATE SODIUM 8.6-50 MG PO TABS: 1.0000 | ORAL_TABLET | Freq: Every evening | ORAL | Status: DC | PRN

## 2021-04-06 NOTE — ED Triage Notes (Signed)
The pt is c/o abd pain for 2 weeks   she was seen this past Wednesday  and  was diagnosed with a mass   for the past 2 days she has  ahd more pain she sounds like she has a cold  elevated temp  lmp 2 weeks ago

## 2021-04-06 NOTE — H&P (Signed)
History and Physical    Caitlin Maddox EXN:[id number] DOB: [birth year] DOA: 04/06/2021  PCP: Kerin Perna, NP  Patient coming from: Home  I have personally briefly reviewed patient's old medical records in [place]  Chief Complaint: Abdominal pain  HPI: Caitlin Maddox is a 40 y.o. 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  Patient had appointment with OB/GYN however given return of abdominal pain she presented to the ED again for further evaluation.  Patient reports about 1 week of suprapubic/lower abdominal pain.  Initially presented to ED 04/02/2021.  Pelvic ultrasound showed a large complex except mass in the left adnexa measuring at least 11.4 cm concerning for ovarian neoplasm. Patient was discharged to home with OB/GYN follow-up and pain medication.  Pain returned today therefore she returned to the ED.  She has been having subjective fevers, chills, diaphoresis, and an episode of transient shortness of breath after today.  She says she has pleuritic discomfort with deep inspiration.  Patient reports chronic heavy menses, last period on 11/04-2012.  She says she did have breakthrough bleeding earlier today which is unusual.  She reports a prior history of tobacco use, 1 pack/day since age of 89 until she quit last year.  She denies any alcohol or illicit drug use.  She reports a history of hypertension in her mother.  ED Course:  Initial vitals showed BP 126/73, pulse 123, RR 20, temp 102.7 F, SPO2 100% on room air.  Labs show WBC 12.6, hemoglobin 9.1, platelets 285,000, sodium 128, potassium 3.8, bicarb 29, BUN 5, creatinine 0.70, serum glucose 113, AST 35, ALT 59, alk phos 146, total bilirubin 0.5, lipase 34.  Lactic  acid 0.9.  Urinalysis shows negative nitrites, small leukocytes, 0-5 RBC/hpf, 11-20 WBC/hpf, no bacteria seen on microscopy.  I-STAT beta-hCG <5.0.  COVID and influenza PCR negative.  Blood cultures collected and pending.  2 view chest x-ray showed minimal left lower lobe patchy opacity.  CT abdomen/pelvis with contrast showed trace right pleural effusion with bibasilar atelectasis.  Splenomegaly with retroperitoneal and retrocrural lymphadenopathy concerning for lymphoproliferative disorder/lymphoma seen.  Interval development of 9 cm fluid density lesion likely arising from the uterus/cervix and found to be inseparable from both the uterus/cervix as well as rectum was noted.  IUD felt to be malpositioned.  Patient was given 1 L normal saline, IV morphine, Tylenol.  OB/GYN was consulted.  They have removed IUD and recommended starting on IV Zosyn.  Medical admission was recommended and the hospitalist service was consulted to admit for further evaluation and management.  Review of Systems: All systems reviewed and are negative except as documented in history of present illness above.   Past Medical History:  Diagnosis Date   Dysfunctional uterine bleeding    Symptomatic anemia     Past Surgical History:  Procedure Laterality Date   CESAREAN SECTION      Social History:  reports that she quit smoking about 5 months ago. Her smoking use included cigarettes. She has a 33.00 pack-year smoking history. She has never used smokeless tobacco. She reports that she does not drink alcohol and does not use drugs.  Allergies  Allergen Reactions   Clindamycin/Lincomycin Anaphylaxis and Swelling    Family History  Problem Relation Age of Onset  Hypertension Mother      Prior to Admission medications   Medication Sig Start Date End Date Taking? Authorizing Provider  acetaminophen (TYLENOL) 500 MG tablet Take 500-1,000 mg by mouth every 6 (six) hours as needed for mild pain or headache.     [provider]  busPIRone (BUSPAR) 5 MG tablet Take 1 tablet (5 mg total) by mouth 2 (two) times daily. 03/06/21   Kerin Perna, NP  docusate sodium (STOOL SOFTENER) 250 MG capsule Take 250 mg by mouth daily.    [provider]  ferrous sulfate (FERROUSUL) 325 (65 FE) MG tablet Take 1 tablet (325 mg total) by mouth 2 (two) times daily. 02/05/21   Patrecia Pour, MD  HYDROcodone-acetaminophen (NORCO/VICODIN) 5-325 MG tablet Take 1 tablet by mouth every 6 (six) hours as needed. 04/02/21   Rayna Sexton, PA-C  ibuprofen (ADVIL) 200 MG tablet Take 600 mg by mouth every 6 (six) hours as needed for mild pain, headache or cramping.    [provider]  metroNIDAZOLE (FLAGYL) 500 MG tablet Take 1 tablet (500 mg total) by mouth 2 (two) times daily. 04/02/21   Rayna Sexton, PA-C  vitamin B-12 (CYANOCOBALAMIN) 500 MCG tablet Take 1 tablet (500 mcg total) by mouth daily. 02/06/21   Patrecia Pour, MD    Physical Exam: Vitals:   04/06/21 1703 04/06/21 1708  BP: 126/73   Pulse: (!) 123   Resp: 20   Temp: (!) 102.7 F (39.3 C)   TempSrc: Oral   SpO2: 100%   Weight:  86.2 kg  Height:  6' (1.829 m)   Constitutional: Resting in bed, NAD, calm, comfortable Eyes: PERRL, lids and conjunctivae normal ENMT: Mucous membranes are moist. Posterior pharynx clear of any exudate or lesions.Normal dentition.  Neck: normal, supple, no masses. Respiratory: clear to auscultation bilaterally, no wheezing, no crackles. Normal respiratory effort. No accessory muscle use.  Cardiovascular: Tachycardic, no murmurs / rubs / gallops. No extremity edema. 2+ pedal pulses. Abdomen: Tender to light palpation lower abdomen/suprapubic region, palpable mass noted. Musculoskeletal: no clubbing / cyanosis. No joint deformity upper and lower extremities. Good ROM, no contractures. Normal muscle tone.  Skin: no rashes, lesions, ulcers. No induration Neurologic: CN 2-12 grossly intact. Sensation  intact. Strength 5/5 in all 4.  Psychiatric: Normal judgment and insight. Alert and oriented x 3. Normal mood.   Labs on Admission: I have personally reviewed following labs and imaging studies  CBC: Recent Labs  Lab 04/02/21 1015 04/06/21 1710  WBC 8.8 12.6*  NEUTROABS  --  9.8*  HGB 9.5* 9.1*  HCT 30.6* 28.0*  MCV 91.3 90.0  PLT 225 449   Basic Metabolic Panel: Recent Labs  Lab 04/02/21 1015 04/06/21 1710  NA 131* 128*  K 3.1* 3.8  CL 96* 90*  CO2 23 29  GLUCOSE 115* 113*  BUN <5* 5*  CREATININE 0.73 0.70  CALCIUM 8.7* 8.6*   GFR: Estimated Creatinine Clearance: 109 mL/min (by C-G formula based on SCr of 0.7 mg/dL). Liver Function Tests: Recent Labs  Lab 04/02/21 1015 04/06/21 1710  AST 20 35  ALT 23 59*  ALKPHOS 106 146*  BILITOT 0.6 0.5  PROT 8.0 8.1  ALBUMIN 3.1* 2.7*   Recent Labs  Lab 04/02/21 1015 04/06/21 1710  LIPASE 20 34   No results for input(s): AMMONIA in the last 168 hours. Coagulation Profile: No results for input(s): INR, PROTIME in the last 168 hours. Cardiac Enzymes: No results for input(s): CKTOTAL, CKMB, CKMBINDEX,  TROPONINI in the last 168 hours. BNP (last 3 results) No results for input(s): PROBNP in the last 8760 hours. HbA1C: No results for input(s): HGBA1C in the last 72 hours. CBG: No results for input(s): GLUCAP in the last 168 hours. Lipid Profile: No results for input(s): CHOL, HDL, LDLCALC, TRIG, CHOLHDL, LDLDIRECT in the last 72 hours. Thyroid Function Tests: No results for input(s): TSH, T4TOTAL, FREET4, T3FREE, THYROIDAB in the last 72 hours. Anemia Panel: No results for input(s): VITAMINB12, FOLATE, FERRITIN, TIBC, IRON, RETICCTPCT in the last 72 hours. Urine analysis:    Component Value Date/Time   COLORURINE AMBER (A) 04/06/2021 1710   APPEARANCEUR HAZY (A) 04/06/2021 1710   LABSPEC 1.021 04/06/2021 1710   PHURINE 8.0 04/06/2021 1710   GLUCOSEU NEGATIVE 04/06/2021 1710   HGBUR SMALL (A) 04/06/2021 1710    BILIRUBINUR NEGATIVE 04/06/2021 1710   KETONESUR NEGATIVE 04/06/2021 1710   PROTEINUR 30 (A) 04/06/2021 1710   NITRITE NEGATIVE 04/06/2021 1710   LEUKOCYTESUR SMALL (A) 04/06/2021 1710    Radiological Exams on Admission: DG Chest 2 View  Result Date: 04/06/2021 CLINICAL DATA:  Shortness of breath and cough. EXAM: CHEST - 2 VIEW COMPARISON:  Chest x-ray 03/17/2021 FINDINGS: There is some minimal strandy and patchy opacities in the left lung base seen best on the lateral view. There is no pleural effusion or pneumothorax. Cardiomediastinal silhouette is within normal limits. No acute fractures are seen. IMPRESSION: 1. Minimal left lower lobe patchy opacities worrisome for infection. 2. Followup PA and lateral chest X-ray is recommended in 3-4 weeks following trial of antibiotic therapy to ensure resolution and exclude underlying malignancy. Electronically Signed   By: Ronney Asters M.D.   On: 04/06/2021 17:41   CT ABDOMEN PELVIS W CONTRAST  Result Date: 04/06/2021 CLINICAL DATA:  Nonlocalized abdominal pain.  Fever. EXAM: CT ABDOMEN AND PELVIS WITH CONTRAST TECHNIQUE: Multidetector CT imaging of the abdomen and pelvis was performed using the standard protocol following bolus administration of intravenous contrast. CONTRAST:  86m OMNIPAQUE IOHEXOL 300 MG/ML  SOLN COMPARISON:  CT pelvis 02/01/2021 FINDINGS: Lower chest: Trace right pleural effusion. Bibasilar atelectasis. Retrocrural lymphadenopathy (3:23). Hepatobiliary: No focal liver abnormality. The gallbladder is contracted. No gallstones, gallbladder wall thickening, or pericholecystic fluid. No biliary dilatation. Pancreas: No focal lesion. Normal pancreatic contour. No surrounding inflammatory changes. No main pancreatic ductal dilatation. Spleen: Enlarged spleen measuring up to 15 cm. Adrenals/Urinary Tract: No adrenal nodule bilaterally. Bilateral kidneys enhance symmetrically. No hydronephrosis. No hydroureter. The urinary bladder is  unremarkable. Stomach/Bowel: Stomach is within normal limits. No evidence of bowel wall thickening or dilatation. Appendix appears normal. Vascular/Lymphatic: No abdominal aorta or iliac aneurysm. Mild atherosclerotic plaque of the aorta and its branches. Retroperitoneal lymphadenopathy and fat stranding: As an example 1.2 cm aortocaval lymph node (3:51). Stranding along the common iliacs and external iliacs suggestive of possible lymphoproliferative findings within the pelvis. No definite pelvic sidewall lymphadenopathy. No inguinal lymphadenopathy Reproductive: T-shaped intrauterine vice within in the uterus. Its position is unclear and may be malpositioned. The uterus is irregular in contour with suggestion of uterine fibroids. Interval development of a 9 cm fluid density lesion that is likely arising from the uterus/cervix within the rectouterine sac and is inseparable from both the uterus/cervix as well as rectum. Bilateral adnexal regions are otherwise grossly unremarkable. Other: No intraperitoneal free fluid. No intraperitoneal free gas. No organized fluid collection. Musculoskeletal: No abdominal wall hernia or abnormality. No suspicious lytic or blastic osseous lesions. No acute displaced fracture. Multilevel degenerative changes  of the spine. IMPRESSION: 1. Trace right pleural effusion. 2. Splenomegaly as well as retroperitoneal and retrocrural lymphadenopathy concerning for lymphoproliferative disorder/lymphoma. Recommend PET-CT further evaluation. 3. Interval development of a 9 cm fluid density lesion likely arising from the uterus/cervix. Lesion is found to be inseparable from both the uterus/cervix as well as rectum. Fibroid uterus is also noted. Recommend pelvic ultrasound further evaluation. 4. Unclear positioning of a T-shaped intrauterine device noted within the uterus. May be malpositioned/too low. Recommend pelvic ultrasound. 5.  Aortic Atherosclerosis (ICD10-I70.0). Electronically Signed   By:  Iven Finn M.D.   On: 04/06/2021 20:12    EKG: Personally reviewed. Sinus tachycardia, rate 114, no acute ischemic changes.  No prior for comparison.  Assessment/Plan Principal Problem:   Mass of uterine cervix Active Problems:   Anemia due to blood loss, chronic   Hyponatremia   Splenomegaly   Loren Vicens is a 40 y.o. female with medical history significant for fibroid uterus with dysfunctional uterine bleeding, anemia due to chronic blood loss, GAD, who is admitted for evaluation of uterine/cervix mass with splenomegaly and concern for a lymphoproliferative disorder/lymphoma.  Mass of uterus/cervix: 9 cm of lesion arising from uterus/cervix, inseparable from uterus/cervix as well as rectum seen on CT imaging.  OB/GYN following, low suspicion for PID or TOA but recommend empiric antibiotics with IV Zosyn. -OB/GYN following, they will notify Gyn/Onc in AM to review case -Continue Zosyn -CA125 ordered -Follow blood cultures  Splenomegaly with retroperitoneal and retrocrural lymphadenopathy: Findings seen on CT A/P concerning for lymphoproliferative disorder/lymphoma. -Check peripheral smear -Follow CBC with differential -PET/CT recommended, this will have to be done on outpatient basis  Fever/leukocytosis: Could be related to underlying infection versus potential malignancy. -Continue Zosyn as above, follow blood cultures  Hyponatremia: Mild, continue IV NS_0  mL/hr and follow labs.  Anemia due to chronic blood loss in setting of fibroid uterus: Hemoglobin currently stable.  Continue to monitor.  Malpositioned IUD: IUD removed by OB/GYN in the ED.  Generalized anxiety disorder: Continue BuSpar.  DVT prophylaxis: SCDs only for now Code Status: Full code, confirmed on admission Family Communication: Discussed with patient, she has discussed with family Disposition Plan: From home, dispo pending clinical progress Consults called: OB/GYN Level of care:  Med-Surg Admission status:  Status is: Observation  The patient remains OBS appropriate and will d/c before 2 midnights.   Zada Finders MD [facility]  If 7PM-7AM, please contact night-coverage [email or web address]  04/06/2021, 10:22 PM

## 2021-04-06 NOTE — Consult Note (Signed)
Gynecology Consult Note  Date of Consult: 04/06/2021   Requesting Provider: [facility]  Primary OBGYN: None Primary Care Provider: Kerin Perna  Reason for Consult: pelvic mass, fevers, IUD in place  History of Present Illness: Ms. Slomski is a 40 y.o. G3P3 (Patient's last menstrual period was 03/28/2021.), with the above CC.  Patient initially presented to the ED on 11/23 for lower abdominal pain. Pelvic u/s showed an 11 x 8 x 6cm posterior cul de sac mass with internal fluid and peripheral areas of lobulated vascular appearing tissue with +AV flow to both ovaries. Uterus 12cm with a few fibroids with ES of 21 and IUD poorly seen. Labs neg with normal WBC, including a wet prep and GC/CT swab. GYN called and plan was for her to keep her already scheduled new GYN visit with the [facility] in December; pt stated she had made this appt to establish GYN care and b/c her IUD was expired and before her current s/s started  Pt states she came back to the ED today for worsening abdominal pain that is suprapubic, feels sharp and is dull and always there but can feel worse. She states the pain overall started last week. No diarrhea, constipation, VB or discharge, vomiting. She does have some mild nausea and is able to take PO fine but doesn't have much of an appetite. She also endorses some increased bloating in the past few days. CT scan today shows the mass but also splenomegaly and enlarged LNs, no ascites. She is also febrile in the ED  Patient had a 02/01/2021 CT pelvis for thigh infection and only her fibroids were seen but not the left adnexal mass  ROS: A 12-point review of systems was performed and negative, except as stated in the above HPI.  OBGYN History: As per HPI. OB History  Gravida Para Term Preterm AB Living  3 3 3         SAB IAB Ectopic Multiple Live Births          4    # Outcome Date GA Lbr Len/2nd Weight Sex Delivery Anes PTL Lv  3 Term           2  Term           1 Term             Obstetric Comments  SVD x 1. C-section x 2.    Periods: 7-8 days, heavy, regular.  History of pap smears: patient not sure She is currently using  nothing  for contraception. She states the paragard has been in for longer than 10 years but is unsure exactly how long she's had it   Past Medical History: Past Medical History:  Diagnosis Date   Dysfunctional uterine bleeding    Symptomatic anemia     Past Surgical History: Past Surgical History:  Procedure Laterality Date   CESAREAN SECTION     CESAREAN SECTION      Family History:  Family History  Problem Relation Age of Onset   Hypertension Mother     Social History:  Social History   Socioeconomic History   Marital status: Single    Spouse name: Not on file   Number of children: Not on file   Years of education: Not on file   Highest education level: Not on file  Occupational History   Occupation: CNA  Tobacco Use   Smoking status: Former    Packs/day: 1.00    Years:  33.00    Pack years: 33.00    Types: Cigarettes    Quit date: 10/09/2020    Years since quitting: 0.4   Smokeless tobacco: Never  Substance and Sexual Activity   Alcohol use: No   Drug use: No   Sexual activity: Not on file  Other Topics Concern   Not on file  Social History Narrative   Not on file   Social Determinants of Health   Financial Resource Strain: Not on file  Food Insecurity: Not on file  Transportation Needs: Not on file  Physical Activity: Not on file  Stress: Not on file  Social Connections: Not on file  Intimate Partner Violence: Not on file    Allergy: Allergies  Allergen Reactions   Clindamycin/Lincomycin Anaphylaxis and Swelling    Medications: Current Facility-Administered Medications  Medication Dose Route Frequency Provider Last Rate Last Admin   0.9 %  sodium chloride infusion   Intravenous Continuous Lenore Cordia, MD       acetaminophen (TYLENOL) tablet 650 mg  650  mg Oral Q6H PRN Lenore Cordia, MD       Or   acetaminophen (TYLENOL) suppository 650 mg  650 mg Rectal Q6H PRN Lenore Cordia, MD       busPIRone (BUSPAR) tablet 5 mg  5 mg Oral BID Lenore Cordia, MD       HYDROcodone-acetaminophen (NORCO/VICODIN) 5-325 MG per tablet 1-2 tablet  1-2 tablet Oral Q4H PRN Lenore Cordia, MD       morphine 2 MG/ML injection 2 mg  2 mg Intravenous Q3H PRN Lenore Cordia, MD       ondansetron (ZOFRAN) tablet 4 mg  4 mg Oral Q6H PRN Lenore Cordia, MD       Or   ondansetron (ZOFRAN) injection 4 mg  4 mg Intravenous Q6H PRN Lenore Cordia, MD       piperacillin-tazobactam (ZOSYN) IVPB 3.375 g  3.375 g Intravenous Q8H Hayden Rasmussen, MD 12.5 mL/hr at 04/06/21 2227 3.375 g at 04/06/21 2227   senna-docusate (Senokot-S) tablet 1 tablet  1 tablet Oral QHS PRN Lenore Cordia, MD       Current Outpatient Medications  Medication Sig Dispense Refill   acetaminophen (TYLENOL) 500 MG tablet Take 500-1,000 mg by mouth every 6 (six) hours as needed for mild pain or headache.     busPIRone (BUSPAR) 5 MG tablet Take 1 tablet (5 mg total) by mouth 2 (two) times daily. 60 tablet 2   docusate sodium (STOOL SOFTENER) 250 MG capsule Take 250 mg by mouth daily.     ferrous sulfate (FERROUSUL) 325 (65 FE) MG tablet Take 1 tablet (325 mg total) by mouth 2 (two) times daily. 60 tablet 1   HYDROcodone-acetaminophen (NORCO/VICODIN) 5-325 MG tablet Take 1 tablet by mouth every 6 (six) hours as needed. 8 tablet 0   ibuprofen (ADVIL) 200 MG tablet Take 600 mg by mouth every 6 (six) hours as needed for mild pain, headache or cramping.     metroNIDAZOLE (FLAGYL) 500 MG tablet Take 1 tablet (500 mg total) by mouth 2 (two) times daily. 14 tablet 0   vitamin B-12 (CYANOCOBALAMIN) 500 MCG tablet Take 1 tablet (500 mcg total) by mouth daily. 30 tablet 1     Physical Exam:  Current Vital Signs 24h Vital Sign Ranges  T (!) 102.7 F (39.3 C)  Temp  Avg: 102.7 F (39.3 C)  Min: 102.7 F  (39.3 C)  Max: 102.7 F (39.3 C)  BP 126/73 BP  Min: 126/73  Max: 126/73  HR (!) 123  Pulse  Avg: 123  Min: 123  Max: 123  RR 20 Resp  Avg: 20  Min: 20  Max: 20  SaO2 100 % Room Air SpO2  Avg: 100 %  Min: 100 %  Max: 100 %       24 Hour I/O Current Shift I/O  Time Ins Outs No intake/output data recorded. No intake/output data recorded.   Patient Vitals for the past 24 hrs:  BP Temp Temp src Pulse Resp SpO2 Height Weight  04/06/21 1708 -- -- -- -- -- -- 6' (1.829 m) 86.2 kg  04/06/21 1703 126/73 (!) 102.7 F (39.3 C) Oral (!) 123 20 100 % -- --    Body mass index is 25.77 kg/m. General appearance: Well nourished, well developed female in no acute distress, resting comfortably Cardiovascular: S1, S2 normal, no murmur, rub or gallop, regular rate and rhythm Respiratory:  Clear to auscultation bilateral. Normal respiratory effort Abdomen: +BS, mildly distended, mild discomfort in the suprabpuic area but no peritoneal s/s Neuro/Psych:  Normal mood and affect.  Skin:  Warm and dry.  Extremities: no clubbing, cyanosis, or edema.  Lymphatic:  No inguinal lymphadenopathy.   Pelvic exam: is not limited by body habitus EGBUS: within normal limits Vagina: scant old blood in the vault Cervix:  nttp, white iud strings seen approx 3-4cm in length   See procedure note for uncomplicated IUD removal  Laboratory: As per HPI Beta HCG: neg  Recent Labs  Lab 04/02/21 1015 04/06/21 1710  WBC 8.8 12.6*  HGB 9.5* 9.1*  HCT 30.6* 28.0*  PLT 225 285   Recent Labs  Lab 04/02/21 1015 04/06/21 1710  NA 131* 128*  K 3.1* 3.8  CL 96* 90*  CO2 23 29  BUN <5* 5*  CREATININE 0.73 0.70  CALCIUM 8.7* 8.6*  PROT 8.0 8.1  BILITOT 0.6 0.5  ALKPHOS 106 146*  ALT 23 59*  AST 20 35  GLUCOSE 115* 113*   No results for input(s): APTT, INR, PTT in the last 168 hours.  Invalid input(s): DRHAPTT Recent Labs  Lab 04/02/21 1015  Switzer A POS   Covid negative Lactic acid negative Lipase  negative  Latest Reference Range & Units 04/06/21 17:10  URINALYSIS, ROUTINE W REFLEX MICROSCOPIC  Rpt !  Appearance CLEAR  HAZY !  Bilirubin Urine NEGATIVE  NEGATIVE  Color, Urine YELLOW  AMBER !  Glucose, UA NEGATIVE mg/dL NEGATIVE  Hgb urine dipstick NEGATIVE  SMALL !  Ketones, ur NEGATIVE mg/dL NEGATIVE  Leukocytes,Ua NEGATIVE  SMALL !  Nitrite NEGATIVE  NEGATIVE  pH 5.0 - 8.0  8.0  Protein NEGATIVE mg/dL 30 !  Specific Gravity, Urine 1.005 - 1.030  1.021  !: Data is abnormal Rpt: View report in Results Review for more information  BCx: NGTD  Imaging:  Narrative & Impression  CLINICAL DATA:  Nonlocalized abdominal pain.  Fever.   EXAM: CT ABDOMEN AND PELVIS WITH CONTRAST   TECHNIQUE: Multidetector CT imaging of the abdomen and pelvis was performed using the standard protocol following bolus administration of intravenous contrast.   CONTRAST:  64mL OMNIPAQUE IOHEXOL 300 MG/ML  SOLN   COMPARISON:  CT pelvis 02/01/2021   FINDINGS: Lower chest: Trace right pleural effusion. Bibasilar atelectasis. Retrocrural lymphadenopathy (3:23).   Hepatobiliary: No focal liver abnormality. The gallbladder is contracted. No gallstones, gallbladder wall thickening, or pericholecystic fluid. No biliary dilatation.  Pancreas: No focal lesion. Normal pancreatic contour. No surrounding inflammatory changes. No main pancreatic ductal dilatation.   Spleen: Enlarged spleen measuring up to 15 cm.   Adrenals/Urinary Tract:   No adrenal nodule bilaterally.   Bilateral kidneys enhance symmetrically.   No hydronephrosis. No hydroureter.   The urinary bladder is unremarkable.   Stomach/Bowel: Stomach is within normal limits. No evidence of bowel wall thickening or dilatation. Appendix appears normal.   Vascular/Lymphatic: No abdominal aorta or iliac aneurysm. Mild atherosclerotic plaque of the aorta and its branches. Retroperitoneal lymphadenopathy and fat stranding: As an example  1.2 cm aortocaval lymph node (3:51). Stranding along the common iliacs and external iliacs suggestive of possible lymphoproliferative findings within the pelvis. No definite pelvic sidewall lymphadenopathy. No inguinal lymphadenopathy   Reproductive: T-shaped intrauterine vice within in the uterus. Its position is unclear and may be malpositioned. The uterus is irregular in contour with suggestion of uterine fibroids. Interval development of a 9 cm fluid density lesion that is likely arising from the uterus/cervix within the rectouterine sac and is inseparable from both the uterus/cervix as well as rectum. Bilateral adnexal regions are otherwise grossly unremarkable.   Other: No intraperitoneal free fluid. No intraperitoneal free gas. No organized fluid collection.   Musculoskeletal:   No abdominal wall hernia or abnormality.   No suspicious lytic or blastic osseous lesions. No acute displaced fracture. Multilevel degenerative changes of the spine.   IMPRESSION: 1. Trace right pleural effusion. 2. Splenomegaly as well as retroperitoneal and retrocrural lymphadenopathy concerning for lymphoproliferative disorder/lymphoma. Recommend PET-CT further evaluation. 3. Interval development of a 9 cm fluid density lesion likely arising from the uterus/cervix. Lesion is found to be inseparable from both the uterus/cervix as well as rectum. Fibroid uterus is also noted. Recommend pelvic ultrasound further evaluation. 4. Unclear positioning of a T-shaped intrauterine device noted within the uterus. May be malpositioned/too low. Recommend pelvic ultrasound. 5.  Aortic Atherosclerosis (ICD10-I70.0).     Electronically Signed   By: Iven Finn M.D.   On: 04/06/2021 20:12     Narrative & Impression  CLINICAL DATA:  Lower pelvic pain   EXAM: TRANSABDOMINAL AND TRANSVAGINAL ULTRASOUND OF PELVIS   DOPPLER ULTRASOUND OF OVARIES   TECHNIQUE: Both transabdominal and transvaginal  ultrasound examinations of the pelvis were performed. Transabdominal technique was performed for global imaging of the pelvis including uterus, ovaries, adnexal regions, and pelvic cul-de-sac.   It was necessary to proceed with endovaginal exam following the transabdominal exam to visualize the uterus endometrium adnexa. Color and duplex Doppler ultrasound was utilized to evaluate blood flow to the ovaries.   COMPARISON:  Pelvic CT 02/01/2021, pelvic ultrasound 10/04/2020   FINDINGS: Uterus   Measurements: 12.2 x 9 x 5.7 cm = volume: 323 mL. Lobulated uterine contour on transabdominal images likely corresponds to history of fibroids but very limited assessment of the uterus on transvaginal images.   Endometrium   Thickness: 21.2 mm.  IUD poorly visible   Right ovary   Measurements: 4.2 x 3.2 x 2.9 cm = volume: 19.8 mL. Normal appearance/no adnexal mass.   Left ovary   Measurements: 3.1 x 1.8 x 1.9 cm = volume: 5.5 mL. Large left adnexal complex mass extending to the posterior cul-de-sac, this measures 11.4 x 7.7 x 6.2 cm. This contains internal fluid fluid level and peripheral areas of lobulated vascular appearing tissue.   Pulsed Doppler evaluation of both ovaries demonstrates normal low-resistance arterial and venous waveforms.   Other findings   No abnormal free  fluid.   IMPRESSION: 1. Large complex cystic mass in the left adnexa extending to the posterior cul-de-sac measuring at least 11.4 cm and probably containing peripheral vascular soft tissue, raising concern for ovarian neoplasm. Gynecology consultation recommended with potential evaluation with MRI. 2. The ovaries are imaged as separate structures and demonstrate no definitive evidence for torsion 3. The uterus is poorly evaluated secondary to the large pelvic mass. The IUD is not well visualized. 4. Endometrial thickness of 21.2 mm. Endometrial thickness is considered abnormal. Consider follow-up by  [place] in 6-8 weeks, during the week immediately following menses (exam timing is critical).     Electronically Signed   By: Donavan Foil M.D.   On: 04/02/2021 16:21   Narrative & Impression  CLINICAL DATA:  Soft tissue infection suspected, thigh, xray done proximal posterior abscess; Soft tissue infection suspected, pelvis, no prior imaging   EXAM: CT PELVIS WITH CONTRAST   CT OF THE LOWER RIGHT EXTREMITY WITH CONTRAST   TECHNIQUE: Multidetector CT imaging of the pelvis and lower right extremity was performed according to the standard protocol following intravenous contrast administration.   CONTRAST:  146mL OMNIPAQUE IOHEXOL 300 MG/ML  SOLN   COMPARISON:  None.   FINDINGS: Bones/Joint/Cartilage   No acute fracture or dislocation. No pelvic diastasis. Mild sclerotic changes associated with the mid right sacroiliac joint without associated erosion, likely degenerative. Hip joint spaces are maintained. No hip joint effusion is seen. Right femur intact and unremarkable. No erosion or periosteal elevation.   Ligaments   Suboptimally assessed by CT.   Muscles and Tendons   No acute musculotendinous abnormality of the pelvis or right thigh. No intramuscular fluid collection.   Soft tissues   Prominent induration within the posterior subcutaneous soft tissues of the proximal right thigh overlying and just below the inferior right gluteus maximus muscle. There is overlying skin thickening. No well-defined or rim enhancing fluid collection. No soft tissue gas. No soft tissue ulceration or defect is evident. Soft tissues of the pelvis and right thigh appear otherwise unremarkable. Mildly prominent right inguinal lymph nodes are likely reactive.   Enlarged leiomyomatous uterus with multiple fibroids which measure approximately 4.8 cm at the anterior uterine body and a 3.7 cm fibroid along the posterior uterine body which appears partially submucosal. IUD is present. No  acute findings are seen within the pelvis.   IMPRESSION: 1. Prominent induration within the posterior subcutaneous soft tissues of the proximal right thigh overlying and just below the inferior right gluteus maximus muscle. There is overlying skin thickening. No well-defined or rim enhancing fluid collection. No soft tissue gas. Findings are most compatible with cellulitis. 2. Mildly prominent right inguinal lymph nodes, likely reactive. 3. No acute osseous abnormality of the pelvis or right femur. 4. Enlarged leiomyomatous uterus.     Electronically Signed   By: Davina Poke D.O.   On: 02/01/2021 14:28     Assessment: pt stable  Plan: D/w patient that I'm concerned regarding malignancy. CA 125 ordered and will d/w Gyn Onc tomorrow. Recommend Zosyn to treat for any PID/TOA although less likely given negative STD swab; also, IUD removed to eliminate that as causing or contributing to her s/s.  D/w Hospitalist team and they are amenable to admission with GYN following closely   Total time taking care of the patient was 45 minutes, with greater than 50% of the time spent in face to face interaction with the patient.  Durene Romans MD Attending [facility] (Faculty  Practice) GYN Consult Phone: [phone] (M-F, [phone]) & [phone] (Off hours, weekends, holidays)

## 2021-04-06 NOTE — Procedures (Addendum)
Intrauterine Device (IUD) Removal Procedure Note  Prior to the procedure being performed, the patient (or guardian) was asked to state their full name, date of birth, and the type of procedure being performed. EGBUS normal. Vaginal vault with scant old blood. Cervix normal with IUD strings seen (approx 3-4cm in length). Strings unable to be grasped with hemostats in the ED due to short length of hemostats so speculum removed and IUD removed with my fingers.  It was easily removed and noted to be intact and a Paragard.   No complications, patient tolerated the procedure well.  Chaperone present during procedure/exam  Durene Romans MD Attending [facility] (Faculty Practice) 04/06/2021 Time: 2100

## 2021-04-06 NOTE — ED Notes (Signed)
Tylenol 3-4 hours ago

## 2021-04-06 NOTE — ED Provider Notes (Addendum)
Emergency Medicine Provider Triage Evaluation Note  Caitlin Maddox , a 40 y.o. female  was evaluated in triage.  Pt complains of lower abdominal pain.  Seen here a few days ago, diagnosed with mass.  Patient now states she has had fever, feeling unwell.  No dysuria, no changes in bowel movements.  No vaginal discharge.  Also noted she started having some shortness of breath when she developed fever earlier today, denies cough, emesis, myalgias, sick contacts.  Tylenol 2 hours PTA  Review of Systems  Positive: Abdominal pain, fever, shortness of breath Negative: Back pain, chest pain, lower extremity edema  Physical Exam  BP 126/73 (BP Location: Left Arm)   Pulse (!) 123   Temp (!) 102.7 F (39.3 C) (Oral)   Resp 20   Ht 6' (1.829 m)   Wt 86.2 kg   LMP 03/28/2021   SpO2 100%   BMI 25.77 kg/m  Gen:   Awake, no distress   Resp:  Normal effort  ABD:  Diffusely tender lower abdomen MSK:   Moves extremities without difficulty  Other:    Medical Decision Making  Medically screening exam initiated at 5:10 PM.  Appropriate orders placed.  Caitlin Maddox was informed that the remainder of the evaluation will be completed by another provider, this initial triage assessment does not replace that evaluation, and the importance of remaining in the ED until their evaluation is complete.  abd pain, fever, tachycardia, labs, imaging ordered as well as COVID test       Caitlin Maddox A, PA-C 04/06/21 1711    Caitlin Dusky, MD 04/06/21 2108

## 2021-04-06 NOTE — Progress Notes (Signed)
Pharmacy Antibiotic Note  Seretha Estabrooks is a 40 y.o. female admitted on 04/06/2021 with  IAI .  Pharmacy has been consulted for zosyn dosing.  Plan: Zosyn 3.375g IV q8h (4 hour infusion).  Height: 6' (182.9 cm) Weight: 86.2 kg (190 lb 0.6 oz) IBW/kg (Calculated) : 73.1  Temp (24hrs), Avg:102.7 F (39.3 C), Min:102.7 F (39.3 C), Max:102.7 F (39.3 C)  Recent Labs  Lab 04/02/21 1015 04/06/21 1710  WBC 8.8 12.6*  CREATININE 0.73 0.70  LATICACIDVEN  --  0.9    Estimated Creatinine Clearance: 109 mL/min (by C-G formula based on SCr of 0.7 mg/dL).    Allergies  Allergen Reactions   Clindamycin/Lincomycin Anaphylaxis and Swelling    Antimicrobials this admission: Zosyn 11/27 >>   Thank you for allowing pharmacy to be a part of this patient's care.  Joetta Manners, PharmD, [facility] Emergency Medicine Clinical Pharmacist ED RPh Phone: [place]: [phone]

## 2021-04-06 NOTE — Progress Notes (Signed)
GYN Note 04/06/2021 2100  Full consult to follow.  IUD removed intact and no issues. D/w pt and presentation is concerning for malignancy. Recommend removal of IUD (done), IV abx (zosyn) which has already been ordered by the ED, a ca125 (ordered) and admission to the medicine service with GYN following closely due to her splenomegaly and concern for lymphoproliferative dz.   Durene Romans MD Attending [facility] [place] (Faculty Practice) GYN Consult Phone: [phone] (M-F, [phone]) & [phone] (Off hours, weekends, holidays)

## 2021-04-06 NOTE — ED Provider Notes (Signed)
[facility] Provider Note   CSN: [id number] Arrival date & time: 04/06/21  1654     History Chief Complaint  Patient presents with   Abdominal Pain    Caitlin Maddox is a 40 y.o. female.  She was here 4 days ago for low abdominal pain.  Found to have an ovarian mass and is supposed to see gynecology for follow-up.  Sent home with some Percocet.  Pain worsened today in her lower abdomen.  She also felt short of breath.  No cough runny nose sore throat headache body aches.  No urinary symptoms.  Nausea no vomiting or diarrhea.  On presentation here she had a high fever.  The history is provided by the patient.  Abdominal Pain Pain location:  Suprapubic Pain quality: stabbing   Pain radiates to:  Does not radiate Pain severity:  Severe Onset quality:  Gradual Duration:  7 days Timing:  Constant Progression:  Worsening Chronicity:  New Context: not sick contacts and not trauma   Relieved by:  Nothing Worsened by:  Nothing Ineffective treatments:  None tried Associated symptoms: fever, nausea and shortness of breath   Associated symptoms: no chest pain, no constipation, no cough, no diarrhea, no dysuria, no sore throat and no vomiting   Shortness of Breath Severity:  Moderate Onset quality:  Gradual Duration:  1 day Timing:  Intermittent Progression:  Unchanged Chronicity:  New Relieved by:  None tried Worsened by:  Activity Ineffective treatments:  None tried Associated symptoms: abdominal pain and fever   Associated symptoms: no chest pain, no cough, no headaches, no hemoptysis, no neck pain, no rash, no sore throat, no sputum production, no syncope and no vomiting       Past Medical History:  Diagnosis Date   Dysfunctional uterine bleeding    Symptomatic anemia     Patient Active Problem List   Diagnosis Date Noted   Cellulitis 02/02/2021   Cellulitis and abscess of buttock 02/01/2021   Dysfunctional uterine bleeding  02/01/2021   Symptomatic anemia 10/04/2020    Past Surgical History:  Procedure Laterality Date   CESAREAN SECTION       OB History   No obstetric history on file.     No family history on file.  Social History   Tobacco Use   Smoking status: Former    Packs/day: 0.50    Types: Cigarettes    Quit date: 10/09/2020    Years since quitting: 0.4   Smokeless tobacco: Never  Substance Use Topics   Alcohol use: No   Drug use: No    Home Medications Prior to Admission medications   Medication Sig Start Date End Date Taking? Authorizing Provider  acetaminophen (TYLENOL) 500 MG tablet Take 500-1,000 mg by mouth every 6 (six) hours as needed for mild pain or headache.    [provider]  busPIRone (BUSPAR) 5 MG tablet Take 1 tablet (5 mg total) by mouth 2 (two) times daily. 03/06/21   Kerin Perna, NP  docusate sodium (STOOL SOFTENER) 250 MG capsule Take 250 mg by mouth daily.    [provider]  ferrous sulfate (FERROUSUL) 325 (65 FE) MG tablet Take 1 tablet (325 mg total) by mouth 2 (two) times daily. 02/05/21   Patrecia Pour, MD  HYDROcodone-acetaminophen (NORCO/VICODIN) 5-325 MG tablet Take 1 tablet by mouth every 6 (six) hours as needed. 04/02/21   Rayna Sexton, PA-C  ibuprofen (ADVIL) 200 MG tablet Take 600 mg by mouth every  6 (six) hours as needed for mild pain, headache or cramping.    [provider]  metroNIDAZOLE (FLAGYL) 500 MG tablet Take 1 tablet (500 mg total) by mouth 2 (two) times daily. 04/02/21   Rayna Sexton, PA-C  vitamin B-12 (CYANOCOBALAMIN) 500 MCG tablet Take 1 tablet (500 mcg total) by mouth daily. 02/06/21   Patrecia Pour, MD    Allergies    Clindamycin/lincomycin  Review of Systems   Review of Systems  Constitutional:  Positive for fever.  HENT:  Negative for sore throat.   Eyes:  Negative for visual disturbance.  Respiratory:  Positive for shortness of breath. Negative for cough, hemoptysis and sputum  production.   Cardiovascular:  Negative for chest pain and syncope.  Gastrointestinal:  Positive for abdominal pain and nausea. Negative for constipation, diarrhea and vomiting.  Genitourinary:  Negative for dysuria.  Musculoskeletal:  Negative for neck pain.  Skin:  Negative for rash.  Neurological:  Negative for headaches.   Physical Exam Updated Vital Signs BP 126/73 (BP Location: Left Arm)   Pulse (!) 123   Temp (!) 102.7 F (39.3 C) (Oral)   Resp 20   Ht 6' (1.829 m)   Wt 86.2 kg   LMP 03/28/2021   SpO2 100%   BMI 25.77 kg/m   Physical Exam Vitals and nursing note reviewed.  Constitutional:      General: She is not in acute distress.    Appearance: Normal appearance. She is well-developed.  HENT:     Head: Normocephalic and atraumatic.  Eyes:     Conjunctiva/sclera: Conjunctivae normal.  Cardiovascular:     Rate and Rhythm: Regular rhythm. Tachycardia present.     Heart sounds: No murmur heard. Pulmonary:     Effort: Pulmonary effort is normal. No respiratory distress.     Breath sounds: Normal breath sounds.  Abdominal:     Palpations: Abdomen is soft.     Tenderness: There is abdominal tenderness (suprapubic).  Musculoskeletal:        General: No swelling, deformity or signs of injury. Normal range of motion.     Cervical back: Neck supple.  Skin:    General: Skin is warm and dry.     Capillary Refill: Capillary refill takes less than 2 seconds.  Neurological:     General: No focal deficit present.     Mental Status: She is alert and oriented to person, place, and time.     Sensory: No sensory deficit.     Motor: No weakness.  Psychiatric:        Mood and Affect: Mood normal.    ED Results / Procedures / Treatments   Labs (all labs ordered are listed, but only abnormal results are displayed) Labs Reviewed  CBC WITH DIFFERENTIAL/PLATELET - Abnormal; Notable for the following components:      Result Value   WBC 12.6 (*)    RBC 3.11 (*)    Hemoglobin  9.1 (*)    HCT 28.0 (*)    RDW 19.7 (*)    Neutro Abs 9.8 (*)    Abs Immature Granulocytes 0.12 (*)    All other components within normal limits  COMPREHENSIVE METABOLIC PANEL - Abnormal; Notable for the following components:   Sodium 128 (*)    Chloride 90 (*)    Glucose, Bld 113 (*)    BUN 5 (*)    Calcium 8.6 (*)    Albumin 2.7 (*)    ALT 59 (*)  Alkaline Phosphatase 146 (*)    All other components within normal limits  URINALYSIS, ROUTINE W REFLEX MICROSCOPIC - Abnormal; Notable for the following components:   Color, Urine AMBER (*)    APPearance HAZY (*)    Hgb urine dipstick SMALL (*)    Protein, ur 30 (*)    Leukocytes,Ua SMALL (*)    All other components within normal limits  CBC WITH DIFFERENTIAL/PLATELET - Abnormal; Notable for the following components:   RBC 2.96 (*)    Hemoglobin 8.5 (*)    HCT 26.6 (*)    RDW 20.2 (*)    Neutro Abs 8.1 (*)    Abs Immature Granulocytes 0.14 (*)    All other components within normal limits  COMPREHENSIVE METABOLIC PANEL - Abnormal; Notable for the following components:   Sodium 133 (*)    BUN <5 (*)    Calcium 8.2 (*)    Albumin 2.4 (*)    All other components within normal limits  CULTURE, BLOOD (ROUTINE X 2)  CULTURE, BLOOD (ROUTINE X 2)  RESP PANEL BY RT-PCR (FLU A&B, COVID) ARPGX2  LIPASE, BLOOD  LACTIC ACID, PLASMA  LACTIC ACID, PLASMA  LACTIC ACID, PLASMA  CA 125  PATHOLOGIST SMEAR REVIEW  SODIUM, URINE, RANDOM  OSMOLALITY  OSMOLALITY, URINE  LACTIC ACID, PLASMA  PROCALCITONIN  I-STAT BETA HCG BLOOD, ED ([facility], [facility], [facility] ONLY)    EKG EKG Interpretation  Date/Time:  Sunday April 06 2021 17:06:28 EST Ventricular Rate:  114 PR Interval:  132 QRS Duration: 82 QT Interval:  292 QTC Calculation: 402 R Axis:   81 Text Interpretation: Sinus tachycardia Otherwise normal ECG No old tracing to compare Confirmed by Aletta Edouard [phone]) on 04/06/2021 6:34:40 PM  Radiology DG Chest 2 View  Result Date:  04/06/2021 CLINICAL DATA:  Shortness of breath and cough. EXAM: CHEST - 2 VIEW COMPARISON:  Chest x-ray 03/17/2021 FINDINGS: There is some minimal strandy and patchy opacities in the left lung base seen best on the lateral view. There is no pleural effusion or pneumothorax. Cardiomediastinal silhouette is within normal limits. No acute fractures are seen. IMPRESSION: 1. Minimal left lower lobe patchy opacities worrisome for infection. 2. Followup PA and lateral chest X-ray is recommended in 3-4 weeks following trial of antibiotic therapy to ensure resolution and exclude underlying malignancy. Electronically Signed   By: Ronney Asters M.D.   On: 04/06/2021 17:41   CT ABDOMEN PELVIS W CONTRAST  Result Date: 04/06/2021 CLINICAL DATA:  Nonlocalized abdominal pain.  Fever. EXAM: CT ABDOMEN AND PELVIS WITH CONTRAST TECHNIQUE: Multidetector CT imaging of the abdomen and pelvis was performed using the standard protocol following bolus administration of intravenous contrast. CONTRAST:  14mL OMNIPAQUE IOHEXOL 300 MG/ML  SOLN COMPARISON:  CT pelvis 02/01/2021 FINDINGS: Lower chest: Trace right pleural effusion. Bibasilar atelectasis. Retrocrural lymphadenopathy (3:23). Hepatobiliary: No focal liver abnormality. The gallbladder is contracted. No gallstones, gallbladder wall thickening, or pericholecystic fluid. No biliary dilatation. Pancreas: No focal lesion. Normal pancreatic contour. No surrounding inflammatory changes. No main pancreatic ductal dilatation. Spleen: Enlarged spleen measuring up to 15 cm. Adrenals/Urinary Tract: No adrenal nodule bilaterally. Bilateral kidneys enhance symmetrically. No hydronephrosis. No hydroureter. The urinary bladder is unremarkable. Stomach/Bowel: Stomach is within normal limits. No evidence of bowel wall thickening or dilatation. Appendix appears normal. Vascular/Lymphatic: No abdominal aorta or iliac aneurysm. Mild atherosclerotic plaque of the aorta and its branches.  Retroperitoneal lymphadenopathy and fat stranding: As an example 1.2 cm aortocaval lymph node (3:51). Stranding along the common  iliacs and external iliacs suggestive of possible lymphoproliferative findings within the pelvis. No definite pelvic sidewall lymphadenopathy. No inguinal lymphadenopathy Reproductive: T-shaped intrauterine vice within in the uterus. Its position is unclear and may be malpositioned. The uterus is irregular in contour with suggestion of uterine fibroids. Interval development of a 9 cm fluid density lesion that is likely arising from the uterus/cervix within the rectouterine sac and is inseparable from both the uterus/cervix as well as rectum. Bilateral adnexal regions are otherwise grossly unremarkable. Other: No intraperitoneal free fluid. No intraperitoneal free gas. No organized fluid collection. Musculoskeletal: No abdominal wall hernia or abnormality. No suspicious lytic or blastic osseous lesions. No acute displaced fracture. Multilevel degenerative changes of the spine. IMPRESSION: 1. Trace right pleural effusion. 2. Splenomegaly as well as retroperitoneal and retrocrural lymphadenopathy concerning for lymphoproliferative disorder/lymphoma. Recommend PET-CT further evaluation. 3. Interval development of a 9 cm fluid density lesion likely arising from the uterus/cervix. Lesion is found to be inseparable from both the uterus/cervix as well as rectum. Fibroid uterus is also noted. Recommend pelvic ultrasound further evaluation. 4. Unclear positioning of a T-shaped intrauterine device noted within the uterus. May be malpositioned/too low. Recommend pelvic ultrasound. 5.  Aortic Atherosclerosis (ICD10-I70.0). Electronically Signed   By: Iven Finn M.D.   On: 04/06/2021 20:12    Procedures Procedures   Medications Ordered in ED Medications  piperacillin-tazobactam (ZOSYN) IVPB 3.375 g (3.375 g Intravenous New Bag/Given 04/07/21 0612)  acetaminophen (TYLENOL) tablet 650 mg (650  mg Oral Given 04/07/21 0914)    Or  acetaminophen (TYLENOL) suppository 650 mg ( Rectal See Alternative 04/07/21 0914)  HYDROcodone-acetaminophen (NORCO/VICODIN) 5-325 MG per tablet 1-2 tablet (has no administration in time range)  senna-docusate (Senokot-S) tablet 1 tablet (has no administration in time range)  ondansetron (ZOFRAN) tablet 4 mg (has no administration in time range)    Or  ondansetron (ZOFRAN) injection 4 mg (has no administration in time range)  busPIRone (BUSPAR) tablet 5 mg (5 mg Oral Given 04/07/21 0914)  0.9 %  sodium chloride infusion ( Intravenous New Bag/Given 04/06/21 2342)  sodium chloride 0.9 % bolus 1,000 mL (0 mLs Intravenous Stopped 04/06/21 2228)  acetaminophen (TYLENOL) tablet 650 mg (650 mg Oral Given 04/06/21 1944)  ondansetron (ZOFRAN) injection 4 mg (4 mg Intravenous Given 04/06/21 1943)  morphine 4 MG/ML injection 4 mg (4 mg Intravenous Given 04/06/21 1943)  iohexol (OMNIPAQUE) 300 MG/ML solution 80 mL (80 mLs Intravenous Contrast Given 04/06/21 1928)  morphine 2 MG/ML injection 2 mg (2 mg Intravenous Given 04/07/21 7253)    ED Course  I have reviewed the triage vital signs and the nursing notes.  Pertinent labs & imaging results that were available during my care of the patient were reviewed by me and considered in my medical decision making (see chart for details).  Clinical Course as of 04/07/21 6644  Nancy Fetter Apr 06, 2021  2029 Discussed with Dr. Ilda Basset from gynecology.  He reviewed her imaging.  He is recommending medicine admission IV Zosyn.  He will consult on the patient.  Planning on coming down to remove her IUD.  Consideration for IR in a.m. for drainage. [MB]  2048 Patient updated on plan and she is agreeable to admission. [MB]  2119 Discussed with [facility] hospitalist Dr. Posey Pronto.  I gave him contact information for Dr. Ilda Basset so they can discuss the case. [MB]    Clinical Course User Index [MB] Hayden Rasmussen, MD   MDM  Rules/Calculators/A&P  Caitlin Maddox was evaluated in Emergency Department on 04/06/2021 for the symptoms described in the history of present illness. She was evaluated in the context of the global COVID-19 pandemic, which necessitated consideration that the patient might be at risk for infection with the SARS-CoV-2 virus that causes COVID-19. Institutional protocols and algorithms that pertain to the evaluation of patients at risk for COVID-19 are in a state of rapid change based on information released by regulatory bodies including the CDC and federal and state organizations. These policies and algorithms were followed during the patient's care in the ED.  This patient complains of continued pelvic pain, new shortness of breath, fever; this involves an extensive number of treatment Options and is a complaint that carries with it a high risk of complications and Morbidity. The differential includes COVID flu pneumonia, intra-abdominal abscess, PID TOA UTI  I ordered, reviewed and interpreted labs, which included CBC with elevated white count, hemoglobin low stable from priors, sodium mildly low, urinalysis without clear signs of infection, pregnancy test negative, COVID flu negative, blood culture sent, lactate not elevated I ordered medication IV pain and nausea medication, IV fluids I ordered imaging studies which included CT abdomen and pelvis, chest x-ray and I independently    visualized and interpreted imaging which showed possible lower lobe infiltrate, pelvic mass with fluid-filled structure  Previous records obtained and reviewed in epic including prior ED visits few days ago I consulted gynecology Dr. Ilda Basset and [facility] hospitalist Dr. Posey Pronto and discussed lab and imaging findings  Critical Interventions: None  After the interventions stated above, I reevaluated the patient and found patient be hemodynamically stable although still having lower abdominal pain.   Reviewed recommendations regarding admission for further work-up and patient agreeable to plan.   Final Clinical Impression(s) / ED Diagnoses Final diagnoses:  Pelvic mass  Fever in adult    Rx / DC Orders ED Discharge Orders     None        Hayden Rasmussen, MD 04/07/21 [phone]

## 2021-04-07 ENCOUNTER — Inpatient Hospital Stay (HOSPITAL_COMMUNITY): Payer: PRIVATE HEALTH INSURANCE

## 2021-04-07 ENCOUNTER — Encounter: Payer: PRIVATE HEALTH INSURANCE | Admitting: Family Medicine

## 2021-04-07 DIAGNOSIS — F411 Generalized anxiety disorder: Secondary | ICD-10-CM | POA: Diagnosis present

## 2021-04-07 DIAGNOSIS — R161 Splenomegaly, not elsewhere classified: Secondary | ICD-10-CM | POA: Diagnosis present

## 2021-04-07 DIAGNOSIS — Z79899 Other long term (current) drug therapy: Secondary | ICD-10-CM | POA: Diagnosis not present

## 2021-04-07 DIAGNOSIS — E8809 Other disorders of plasma-protein metabolism, not elsewhere classified: Secondary | ICD-10-CM | POA: Diagnosis present

## 2021-04-07 DIAGNOSIS — Z20822 Contact with and (suspected) exposure to covid-19: Secondary | ICD-10-CM | POA: Diagnosis present

## 2021-04-07 DIAGNOSIS — R599 Enlarged lymph nodes, unspecified: Secondary | ICD-10-CM | POA: Diagnosis not present

## 2021-04-07 DIAGNOSIS — Z881 Allergy status to other antibiotic agents status: Secondary | ICD-10-CM | POA: Diagnosis not present

## 2021-04-07 DIAGNOSIS — E871 Hypo-osmolality and hyponatremia: Secondary | ICD-10-CM | POA: Diagnosis present

## 2021-04-07 DIAGNOSIS — N939 Abnormal uterine and vaginal bleeding, unspecified: Secondary | ICD-10-CM | POA: Diagnosis not present

## 2021-04-07 DIAGNOSIS — N739 Female pelvic inflammatory disease, unspecified: Secondary | ICD-10-CM | POA: Diagnosis present

## 2021-04-07 DIAGNOSIS — Y762 Prosthetic and other implants, materials and accessory obstetric and gynecological devices associated with adverse incidents: Secondary | ICD-10-CM | POA: Diagnosis present

## 2021-04-07 DIAGNOSIS — N920 Excessive and frequent menstruation with regular cycle: Secondary | ICD-10-CM | POA: Diagnosis present

## 2021-04-07 DIAGNOSIS — J9811 Atelectasis: Secondary | ICD-10-CM | POA: Diagnosis present

## 2021-04-07 DIAGNOSIS — Z87891 Personal history of nicotine dependence: Secondary | ICD-10-CM | POA: Diagnosis not present

## 2021-04-07 DIAGNOSIS — R1900 Intra-abdominal and pelvic swelling, mass and lump, unspecified site: Secondary | ICD-10-CM | POA: Diagnosis present

## 2021-04-07 DIAGNOSIS — D252 Subserosal leiomyoma of uterus: Secondary | ICD-10-CM | POA: Diagnosis present

## 2021-04-07 DIAGNOSIS — Z6825 Body mass index (BMI) 25.0-25.9, adult: Secondary | ICD-10-CM | POA: Diagnosis not present

## 2021-04-07 DIAGNOSIS — R6510 Systemic inflammatory response syndrome (SIRS) of non-infectious origin without acute organ dysfunction: Secondary | ICD-10-CM | POA: Diagnosis present

## 2021-04-07 DIAGNOSIS — R509 Fever, unspecified: Secondary | ICD-10-CM | POA: Diagnosis not present

## 2021-04-07 DIAGNOSIS — E441 Mild protein-calorie malnutrition: Secondary | ICD-10-CM | POA: Diagnosis present

## 2021-04-07 DIAGNOSIS — T8332XA Displacement of intrauterine contraceptive device, initial encounter: Secondary | ICD-10-CM | POA: Diagnosis present

## 2021-04-07 DIAGNOSIS — D500 Iron deficiency anemia secondary to blood loss (chronic): Secondary | ICD-10-CM | POA: Diagnosis present

## 2021-04-07 DIAGNOSIS — N888 Other specified noninflammatory disorders of cervix uteri: Secondary | ICD-10-CM | POA: Diagnosis not present

## 2021-04-07 DIAGNOSIS — D250 Submucous leiomyoma of uterus: Secondary | ICD-10-CM | POA: Diagnosis present

## 2021-04-07 LAB — CBC WITH DIFFERENTIAL/PLATELET
Abs Immature Granulocytes: 0.14 10*3/uL — ABNORMAL HIGH (ref 0.00–0.07)
Basophils Absolute: 0 10*3/uL (ref 0.0–0.1)
Basophils Relative: 0 %
Eosinophils Absolute: 0.1 10*3/uL (ref 0.0–0.5)
Eosinophils Relative: 1 %
HCT: 26.6 % — ABNORMAL LOW (ref 36.0–46.0)
Hemoglobin: 8.5 g/dL — ABNORMAL LOW (ref 12.0–15.0)
Immature Granulocytes: 1 %
Lymphocytes Relative: 12 %
Lymphs Abs: 1.2 10*3/uL (ref 0.7–4.0)
MCH: 28.7 pg (ref 26.0–34.0)
MCHC: 32 g/dL (ref 30.0–36.0)
MCV: 89.9 fL (ref 80.0–100.0)
Monocytes Absolute: 1 10*3/uL (ref 0.1–1.0)
Monocytes Relative: 9 %
Neutro Abs: 8.1 10*3/uL — ABNORMAL HIGH (ref 1.7–7.7)
Neutrophils Relative %: 77 %
Platelets: 200 10*3/uL (ref 150–400)
RBC: 2.96 MIL/uL — ABNORMAL LOW (ref 3.87–5.11)
RDW: 20.2 % — ABNORMAL HIGH (ref 11.5–15.5)
WBC: 10.5 10*3/uL (ref 4.0–10.5)
nRBC: 0 % (ref 0.0–0.2)

## 2021-04-07 LAB — COMPREHENSIVE METABOLIC PANEL
ALT: 44 U/L (ref 0–44)
AST: 24 U/L (ref 15–41)
Albumin: 2.4 g/dL — ABNORMAL LOW (ref 3.5–5.0)
Alkaline Phosphatase: 125 U/L (ref 38–126)
Anion gap: 6 (ref 5–15)
BUN: <5 mg/dL — ABNORMAL LOW (ref 6–20)
CO2: 28 mmol/L (ref 22–32)
Calcium: 8.2 mg/dL — ABNORMAL LOW (ref 8.9–10.3)
Chloride: 99 mmol/L (ref 98–111)
Creatinine, Ser: 0.69 mg/dL (ref 0.44–1.00)
GFR, Estimated: >60 mL/min (ref >60)
Glucose, Bld: 94 mg/dL (ref 70–99)
Potassium: 4 mmol/L (ref 3.5–5.1)
Sodium: 133 mmol/L — ABNORMAL LOW (ref 135–145)
Total Bilirubin: 0.4 mg/dL (ref 0.3–1.2)
Total Protein: 7.1 g/dL (ref 6.5–8.1)

## 2021-04-07 LAB — LACTATE DEHYDROGENASE: LDH: 115 U/L (ref 98–192)

## 2021-04-07 LAB — PROCALCITONIN: Procalcitonin: 0.34 ng/mL

## 2021-04-07 LAB — OSMOLALITY, URINE: Osmolality, Ur: 334 mOsm/kg (ref 300–900)

## 2021-04-07 LAB — SODIUM, URINE, RANDOM: Sodium, Ur: 95 mmol/L

## 2021-04-07 LAB — LACTIC ACID, PLASMA
Lactic Acid, Venous: 0.8 mmol/L (ref 0.5–1.9)
Lactic Acid, Venous: 1.1 mmol/L (ref 0.5–1.9)

## 2021-04-07 LAB — OSMOLALITY: Osmolality: 277 mOsm/kg (ref 275–295)

## 2021-04-07 MED ORDER — GADOBUTROL 1 MMOL/ML IV SOLN
8.5000 mL | Freq: Once | INTRAVENOUS | Status: AC | PRN
  Administered 2021-04-07: 17:00:00 8.5 mL via INTRAVENOUS

## 2021-04-07 MED ORDER — ALUM & MAG HYDROXIDE-SIMETH 200-200-20 MG/5ML PO SUSP
30.0000 mL | Freq: Four times a day (QID) | ORAL | Status: DC | PRN
  Administered 2021-04-07: 20:00:00 30 mL via ORAL
  Filled 2021-04-07: qty 30

## 2021-04-07 NOTE — Progress Notes (Signed)
PROGRESS NOTE    Caitlin Maddox  CBJ:[id number] DOB: April 08, 1981 DOA: 04/06/2021 PCP: Kerin Perna, NP     Brief Narrative:  40 y.o. 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  Patient had appointment with OB/GYN however given return of abdominal pain she presented to the ED again for further evaluation.  Patient reports about 1 week of suprapubic/lower abdominal pain.  Initially presented to ED 04/02/2021.  Pelvic ultrasound showed a large complex except mass in the left adnexa measuring at least 11.4 cm concerning for ovarian neoplasm. Patient was discharged to home with OB/GYN follow-up and pain medication.  Pain returned today therefore she returned to the ED.  She has been having subjective fevers, chills, diaphoresis, and an episode of transient shortness of breath after today.  She says she has pleuritic discomfort with deep inspiration.   Patient reports chronic heavy menses, last period on 11/04-2012.  She says she did have breakthrough bleeding earlier today which is unusual.   She reports a prior history of tobacco use, 1 pack/day since age of 54 until she quit last year.  She denies any alcohol or illicit drug use.  She reports a history of hypertension in her mother.   ED Course:  Initial vitals showed BP 126/73, pulse 123, RR 20, temp 102.7 F, SPO2 100% on room air.   Labs show WBC 12.6, hemoglobin 9.1, platelets 285,000, sodium 128, potassium 3.8, bicarb 29, BUN 5, creatinine 0.70, serum glucose 113, AST 35, ALT 59, alk phos 146, total bilirubin 0.5, lipase 34.  Lactic acid 0.9.   Urinalysis shows negative nitrites, small leukocytes, 0-5 RBC/hpf, 11-20 WBC/hpf, no bacteria seen on microscopy.  I-STAT beta-hCG <5.0.   COVID and influenza PCR negative.  Blood cultures collected and pending.   2 view chest x-ray showed minimal left lower lobe patchy opacity.   CT abdomen/pelvis with contrast showed trace right pleural effusion with bibasilar atelectasis.  Splenomegaly with retroperitoneal and retrocrural lymphadenopathy concerning for lymphoproliferative disorder/lymphoma seen.  Interval development of 9 cm fluid density lesion likely arising from the uterus/cervix and found to be inseparable from both the uterus/cervix as well as rectum was noted.  IUD felt to be malpositioned.   Patient was given 1 L normal saline, IV morphine, Tylenol.  OB/GYN was consulted.  They have removed IUD and recommended starting on IV Zosyn.  Medical admission was recommended and the hospitalist service was consulted to admit for further evaluation and management.     Subjective: 11/28 afebrile, A/O x4, positive abdominal pain   Assessment & Plan: Covid vaccination; vaccinated 3/3   Principal Problem:   Mass of uterine cervix Active Problems:   Anemia due to blood loss, chronic   Hyponatremia   Splenomegaly   Pelvic mass  Mass of uterus/cervix: 9 cm of lesion arising from uterus/cervix, inseparable from uterus/cervix as well as rectum seen on CT imaging.  OB/GYN following, low suspicion for PID or TOA but recommend empiric antibiotics with IV Zosyn. -OB/GYN following, they will notify Gyn/Onc in AM to review case -Continue Zosyn -CA125 ordered -Follow blood cultures -11/28 discussed case with Dr. Elgie Congo gynecology oncology, they are waiting stat MRI of abdomen and pelvis to be completed.  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 - 11/28 also discussed case  with NP Joylene John gynecologic oncology who will evaluate patient today.  We will need to determine best course of action for obtaining biopsy.   Splenomegaly with retroperitoneal and retrocrural lymphadenopathy: Findings seen on CT A/P concerning for lymphoproliferative  disorder/lymphoma. -Check peripheral smear -Follow CBC with differential -PET/CT recommended, this will have to be done on outpatient basis   Fever/leukocytosis: Could be related to underlying infection versus potential malignancy. -Continue Zosyn as above, follow blood cultures   Hyponatremia: Mild, continue IV NS_0  mL/hr and follow labs.   Anemia due to chronic blood loss in setting of fibroid uterus:  Lab Results  Component Value Date   HGB 8.5 (L) 04/07/2021   HGB 9.1 (L) 04/06/2021   HGB 9.5 (L) 04/02/2021   HGB 8.0 (L) 02/05/2021   HGB 7.0 (L) 02/05/2021  -Hemoglobin currently stable.  Continue to monitor. -Transfuse for hemoglobin<7   Malpositioned IUD: -IUD removed by OB/GYN in the ED.   Generalized anxiety disorder: -Continue BuSpar.        DVT prophylaxis: SCD Code Status: Full Family Communication: 11/28 mother at bedside for discussion of plan of care all questions answered Status is: Inpatient    Dispo: The patient is from: Home              Anticipated d/c is to: Home              Anticipated d/c date is: 3 days              Patient currently is not medically stable to d/c.      Consultants:  Gynecology oncology Hematology oncology  Procedures/Significant Events:    I have personally reviewed and interpreted all radiology studies and my findings are as above.  VENTILATOR SETTINGS:    Cultures   Antimicrobials: Anti-infectives (From admission, onward)    Start     Dose/Rate Route Frequency Ordered Stop   04/06/21 2100  piperacillin-tazobactam (ZOSYN) IVPB 3.375 g        3.375 g 12.5 mL/hr over 240 Minutes Intravenous Every 8 hours 04/06/21 2050           Devices    LINES / TUBES:      Continuous Infusions:  piperacillin-tazobactam (ZOSYN)  IV Stopped (04/07/21 1719)     Objective: Vitals:   04/07/21 1330 04/07/21 1345 04/07/21 1400 04/07/21 1411  BP: 106/70 110/61 107/71   Pulse: 85 83 85   Resp:   17    Temp:    98.7 F (37.1 C)  TempSrc:    Oral  SpO2: 99% 98% 97%   Weight:      Height:        Intake/Output Summary (Last 24 hours) at 04/07/2021 1854 Last data filed at 04/07/2021 1532 Gross per 24 hour  Intake 146.33 ml  Output --  Net 146.33 ml   Filed Weights   04/06/21 1708  Weight: 86.2 kg    Examination:  General: A/O x4, No acute respiratory distress Eyes: negative scleral hemorrhage, negative anisocoria, negative icterus ENT: Negative Runny nose, negative gingival bleeding, Neck:  Negative scars, masses, torticollis, lymphadenopathy, JVD Lungs: Clear to auscultation bilaterally without wheezes or crackles Cardiovascular: Regular rate and rhythm without murmur gallop or rub normal S1 and S2 Abdomen: Positive abdominal pain RLQ/LLQ to palpation, nondistended, positive soft, bowel sounds, no rebound, no ascites, no appreciable mass Extremities: No significant cyanosis, clubbing, or edema bilateral lower extremities Skin: Negative rashes, lesions, ulcers Psychiatric:  Negative depression, negative anxiety, negative fatigue,  negative mania  Central nervous system:  Cranial nerves II through XII intact, tongue/uvula midline, all extremities muscle strength 5/5, sensation intact throughout, negative dysarthria, negative expressive aphasia, negative receptive aphasia.  .     Data Reviewed: Care during the described time interval was provided by me .  I have reviewed this patient's available data, including medical history, events of note, physical examination, and all test results as part of my evaluation.  CBC: Recent Labs  Lab 04/02/21 1015 04/06/21 1710 04/07/21 0409  WBC 8.8 12.6* 10.5  NEUTROABS  --  9.8* 8.1*  HGB 9.5* 9.1* 8.5*  HCT 30.6* 28.0* 26.6*  MCV 91.3 90.0 89.9  PLT 225 285 300   Basic Metabolic Panel: Recent Labs  Lab 04/02/21 1015 04/06/21 1710 04/07/21 0409  NA 131* 128* 133*  K 3.1* 3.8 4.0  CL 96* 90* 99  CO2 _0 GLUCOSE 115*  113* 94  BUN <5* 5* <5*  CREATININE 0.73 0.70 0.69  CALCIUM 8.7* 8.6* 8.2*   GFR: Estimated Creatinine Clearance: 109 mL/min (by C-G formula based on SCr of 0.69 mg/dL). Liver Function Tests: Recent Labs  Lab 04/02/21 1015 04/06/21 1710 04/07/21 0409  AST 20 35 24  ALT 23 59* 44  ALKPHOS 106 146* 125  BILITOT 0.6 0.5 0.4  PROT 8.0 8.1 7.1  ALBUMIN 3.1* 2.7* 2.4*   Recent Labs  Lab 04/02/21 1015 04/06/21 1710  LIPASE 20 34   No results for input(s): AMMONIA in the last 168 hours. Coagulation Profile: No results for input(s): INR, PROTIME in the last 168 hours. Cardiac Enzymes: No results for input(s): CKTOTAL, CKMB, CKMBINDEX, TROPONINI in the last 168 hours. BNP (last 3 results) No results for input(s): PROBNP in the last 8760 hours. HbA1C: No results for input(s): HGBA1C in the last 72 hours. CBG: No results for input(s): GLUCAP in the last 168 hours. Lipid Profile: No results for input(s): CHOL, HDL, LDLCALC, TRIG, CHOLHDL, LDLDIRECT in the last 72 hours. Thyroid Function Tests: No results for input(s): TSH, T4TOTAL, FREET4, T3FREE, THYROIDAB in the last 72 hours. Anemia Panel: No results for input(s): VITAMINB12, FOLATE, FERRITIN, TIBC, IRON, RETICCTPCT in the last 72 hours. Sepsis Labs: Recent Labs  Lab 04/06/21 1710 04/06/21 1910 04/07/21 0836 04/07/21 1147  PROCALCITON  --   --  0.34  --   LATICACIDVEN 0.9 0.7 0.8 1.1    Recent Results (from the past 240 hour(s))  Wet prep, genital     Status: Abnormal   Collection Time: 04/02/21  7:51 PM   Specimen: PATH Cytology Cervicovaginal Ancillary Only  Result Value Ref Range Status   Yeast Wet Prep HPF POC NONE SEEN NONE SEEN Final   Trich, Wet Prep NONE SEEN NONE SEEN Final   Clue Cells Wet Prep HPF POC PRESENT (A) NONE SEEN Final   WBC, Wet Prep HPF POC <10 <10 Final    Comment: Please note change in reference range.   Sperm NONE SEEN  Final    Comment: Performed at [facility], [place] [address] [place], [place] [address]  Blood culture (routine x 2)     Status: None (Preliminary result)   Collection Time: 04/06/21  4:54 PM   Specimen: BLOOD  Result Value Ref Range Status   Specimen Description BLOOD RIGHT ANTECUBITAL  Final   Special Requests   Final    BOTTLES DRAWN AEROBIC AND ANAEROBIC Blood Culture adequate volume   Culture   Final    NO  GROWTH < 12 HOURS Performed at [facility] [address], [place], [place] [address]    Report Status PENDING  Incomplete  Resp Panel by RT-PCR (Flu A&B, Covid) Nasopharyngeal Swab     Status: None   Collection Time: 04/06/21  5:57 PM   Specimen: Nasopharyngeal Swab; Nasopharyngeal(NP) swabs in vial transport medium  Result Value Ref Range Status   SARS Coronavirus 2 by RT PCR NEGATIVE NEGATIVE Final    Comment: (NOTE) SARS-CoV-2 target nucleic acids are NOT DETECTED.  The SARS-CoV-2 RNA is generally detectable in upper respiratory specimens during the acute phase of infection. The lowest concentration of SARS-CoV-2 viral copies this assay can detect is 138 copies/mL. A negative result does not preclude SARS-Cov-2 infection and should not be used as the sole basis for treatment or other patient management decisions. A negative result may occur with  improper specimen collection/handling, submission of specimen other than nasopharyngeal swab, presence of viral mutation(s) within the areas targeted by this assay, and inadequate number of viral copies(<138 copies/mL). A negative result must be combined with clinical observations, patient history, and epidemiological information. The expected result is Negative.  Fact Sheet for Patients:  EntrepreneurPulse.com.au  Fact Sheet for Healthcare Providers:  IncredibleEmployment.be  This test is no t yet approved or cleared by the [place] FDA and  has been authorized for detection and/or diagnosis of SARS-CoV-2 by FDA under an Emergency Use  Authorization (EUA). This EUA will remain  in effect (meaning this test can be used) for the duration of the COVID-19 declaration under Section 564(b)(1) of the Act, 21 U.S.C.section 360bbb-3(b)(1), unless the authorization is terminated  or revoked sooner.       Influenza A by PCR NEGATIVE NEGATIVE Final   Influenza B by PCR NEGATIVE NEGATIVE Final    Comment: (NOTE) The Xpert Xpress SARS-CoV-2/FLU/RSV plus assay is intended as an aid in the diagnosis of influenza from Nasopharyngeal swab specimens and should not be used as a sole basis for treatment. Nasal washings and aspirates are unacceptable for Xpert Xpress SARS-CoV-2/FLU/RSV testing.  Fact Sheet for Patients: EntrepreneurPulse.com.au  Fact Sheet for Healthcare Providers: IncredibleEmployment.be  This test is not yet approved or cleared by the [place] FDA and has been authorized for detection and/or diagnosis of SARS-CoV-2 by FDA under an Emergency Use Authorization (EUA). This EUA will remain in effect (meaning this test can be used) for the duration of the COVID-19 declaration under Section 564(b)(1) of the Act, 21 U.S.C. section 360bbb-3(b)(1), unless the authorization is terminated or revoked.  Performed at [facility], [place] [address], [place], [place] [address]   Blood culture (routine x 2)     Status: None (Preliminary result)   Collection Time: 04/06/21  6:58 PM   Specimen: BLOOD  Result Value Ref Range Status   Specimen Description BLOOD RIGHT ANTECUBITAL  Final   Special Requests   Final    BOTTLES DRAWN AEROBIC AND ANAEROBIC Blood Culture results may not be optimal due to an inadequate volume of blood received in culture bottles   Culture   Final    NO GROWTH < 12 HOURS Performed at [facility], [place] [address] [place], [place] [address]    Report Status PENDING  Incomplete         Radiology Studies: DG Chest 2 View  Result Date:  04/06/2021 CLINICAL DATA:  Shortness of breath and cough. EXAM: CHEST - 2 VIEW COMPARISON:  Chest x-ray 03/17/2021 FINDINGS: There is  some minimal strandy and patchy opacities in the left lung base seen best on the lateral view. There is no pleural effusion or pneumothorax. Cardiomediastinal silhouette is within normal limits. No acute fractures are seen. IMPRESSION: 1. Minimal left lower lobe patchy opacities worrisome for infection. 2. Followup PA and lateral chest X-ray is recommended in 3-4 weeks following trial of antibiotic therapy to ensure resolution and exclude underlying malignancy. Electronically Signed   By: Ronney Asters M.D.   On: 04/06/2021 17:41   MR PELVIS W WO CONTRAST  Result Date: 04/07/2021 CLINICAL DATA:  Pelvic/adnexal masses EXAM: MRI PELVIS WITHOUT AND WITH CONTRAST TECHNIQUE: Multiplanar multisequence MR imaging of the pelvis was performed both before and after administration of intravenous contrast. CONTRAST:  8.57m GADAVIST GADOBUTROL 1 MMOL/ML IV SOLN COMPARISON:  CT scan 04/06/2021.  Pelvic ultrasound 04/02/2021 FINDINGS: Urinary Tract:  Urinary bladder unremarkable Bowel:  No abnormality observed Vascular/Lymphatic: Right external iliac node 1.0 cm in short axis on image 44 series 11. The right external iliac vein is somewhat narrowed by the adjacent ovary. Reproductive: 2 dominant low signal intensity uterine fibroids are identified, anterior fibroid 4.8 by 4.5 by 5.0 cm and the more posterior fibroid measuring 4.7 by 3.8 by 3.2 cm. This more posterior lower uterine segment fibroid has a submucosal component. The endometrium does not appear thickened. There is low T2 signal intensity throughout the myometrium and adenomyosis is not excluded. The right ovary measures about 5.0 by 2.4 by 4.0 cm, and appears to contain a 2.3 by 2.0 by 2.5 cm anterior cystic lesion with only marginal enhancement. This lesion has mixed precontrast T2 signal but primarily high, and is likely a mildly  complex cyst. The left ovary measures approximately 3.6 by 1.8 by 2.7 cm. In the cul-de-sac region and abutting both ovaries, we demonstrate a 11.2 by 8.8 by 9.7 cm cystic lesion with an internal fluid-fluid level which could reflect hemorrhagic or infected contents. This only has fairly thin marginal enhancement which would seemingly be unusual for infected lesion. There is some enhancing nodularity along the left anterior margin on image 39 of series 11 such that a small soft tissue nodule in this lesion is not excluded. Other: On T2 weighted images, there appears to be some retroperitoneal edema extending along the pelvic sidewalls, and also edema in the presacral space, as shown for example on series 6. Musculoskeletal: No bony abnormality is observed. IMPRESSION: 1. Large cystic lesion in the cul-de-sac has a fluid-fluid level and only mild marginal enhancement although does have some nodular enhancing components along the left anterior margin and to a lesser extent along the right anterior margin adjacent to the ovaries. Given that this is new compared to 02/01/2021, I am skeptical that it represents an ovarian malignancy. More likely this is some form of hemorrhagic cystic lesion or less likely abscess in the cul-de-sac region. A could be arising from either ovary based on the abutment of both ovaries. There is edema tracking around both ovaries and in the pelvic sidewalls which is nonspecific, along with nonspecific presacral edema. Presumably this could be related to inflammation in the region, and if the has fever then the possibility of a large tubo-ovarian abscess extending in the cul-de-sac should be considered. 2. Mildly complex cystic lesion but without internal nodular enhancement anteriorly in the right ovary, probably a benign complex cyst given the thin marginal enhancement. 3. Notable uterine fibroids including a lower uterine segment subserosal fibroid. 4. Borderline right external iliac  adenopathy, nonspecific. Electronically  Signed   By: Van Clines M.D.   On: 04/07/2021 18:26   CT ABDOMEN PELVIS W CONTRAST  Result Date: 04/06/2021 CLINICAL DATA:  Nonlocalized abdominal pain.  Fever. EXAM: CT ABDOMEN AND PELVIS WITH CONTRAST TECHNIQUE: Multidetector CT imaging of the abdomen and pelvis was performed using the standard protocol following bolus administration of intravenous contrast. CONTRAST:  64m OMNIPAQUE IOHEXOL 300 MG/ML  SOLN COMPARISON:  CT pelvis 02/01/2021 FINDINGS: Lower chest: Trace right pleural effusion. Bibasilar atelectasis. Retrocrural lymphadenopathy (3:23). Hepatobiliary: No focal liver abnormality. The gallbladder is contracted. No gallstones, gallbladder wall thickening, or pericholecystic fluid. No biliary dilatation. Pancreas: No focal lesion. Normal pancreatic contour. No surrounding inflammatory changes. No main pancreatic ductal dilatation. Spleen: Enlarged spleen measuring up to 15 cm. Adrenals/Urinary Tract: No adrenal nodule bilaterally. Bilateral kidneys enhance symmetrically. No hydronephrosis. No hydroureter. The urinary bladder is unremarkable. Stomach/Bowel: Stomach is within normal limits. No evidence of bowel wall thickening or dilatation. Appendix appears normal. Vascular/Lymphatic: No abdominal aorta or iliac aneurysm. Mild atherosclerotic plaque of the aorta and its branches. Retroperitoneal lymphadenopathy and fat stranding: As an example 1.2 cm aortocaval lymph node (3:51). Stranding along the common iliacs and external iliacs suggestive of possible lymphoproliferative findings within the pelvis. No definite pelvic sidewall lymphadenopathy. No inguinal lymphadenopathy Reproductive: T-shaped intrauterine vice within in the uterus. Its position is unclear and may be malpositioned. The uterus is irregular in contour with suggestion of uterine fibroids. Interval development of a 9 cm fluid density lesion that is likely arising from the uterus/cervix  within the rectouterine sac and is inseparable from both the uterus/cervix as well as rectum. Bilateral adnexal regions are otherwise grossly unremarkable. Other: No intraperitoneal free fluid. No intraperitoneal free gas. No organized fluid collection. Musculoskeletal: No abdominal wall hernia or abnormality. No suspicious lytic or blastic osseous lesions. No acute displaced fracture. Multilevel degenerative changes of the spine. IMPRESSION: 1. Trace right pleural effusion. 2. Splenomegaly as well as retroperitoneal and retrocrural lymphadenopathy concerning for lymphoproliferative disorder/lymphoma. Recommend PET-CT further evaluation. 3. Interval development of a 9 cm fluid density lesion likely arising from the uterus/cervix. Lesion is found to be inseparable from both the uterus/cervix as well as rectum. Fibroid uterus is also noted. Recommend pelvic ultrasound further evaluation. 4. Unclear positioning of a T-shaped intrauterine device noted within the uterus. May be malpositioned/too low. Recommend pelvic ultrasound. 5.  Aortic Atherosclerosis (ICD10-I70.0). Electronically Signed   By: MIven FinnM.D.   On: 04/06/2021 20:12        Scheduled Meds:  busPIRone  5 mg Oral BID   Continuous Infusions:  piperacillin-tazobactam (ZOSYN)  IV Stopped (04/07/21 1719)     LOS: 0 days    Time spent:40 min    Vaani Morren, CGeraldo Docker MD [facility]   If 7PM-7AM, please contact night-coverage 04/07/2021, 6:54 PM

## 2021-04-07 NOTE — Progress Notes (Signed)
Patient arrived to [facility] room 14 alert and oriented x4. Bed in lowest position. Call light in reach.

## 2021-04-07 NOTE — Progress Notes (Signed)
Gynecology Progress Note  Admission Date: 04/06/2021 Current Date: 04/07/2021 2:13 PM  Caitlin Maddox is a 40 y.o. G3P3000 HD#1 admitted for abdominal pain and fever along with pelvic mass.   History complicated by: Patient Active Problem List   Diagnosis Date Noted   Mass of uterine cervix 04/06/2021   Anemia due to blood loss, chronic 04/06/2021   Hyponatremia 04/06/2021   Splenomegaly 04/06/2021   Pelvic mass 04/06/2021   Cellulitis 02/02/2021   Cellulitis and abscess of buttock 02/01/2021   Dysfunctional uterine bleeding 02/01/2021   Symptomatic anemia 10/04/2020    ROS and patient/family/surgical history, located on admission H&P note dated 04/06/2021, have been reviewed, and there are no changes except as noted below Yesterday/Overnight Events:  N/a  Subjective:  Pt discussed with gyn oncologist.  Recommended additional tumor markers and pelvic MRI for further evaluation.  Brief review of chart, gyn onc believes mass likely not from gyn origin.  MRI will be helpful for this eval.  Currently, pt feel mild lower abdominal pain, worse in LLQ.  No current nausea and vomiting.  Objective:   Vitals:   04/07/21 1330 04/07/21 1345 04/07/21 1400 04/07/21 1411  BP: 106/70 110/61 107/71   Pulse: 85 83 85   Resp:   17   Temp:    98.7 F (37.1 C)  TempSrc:    Oral  SpO2: 99% 98% 97%   Weight:      Height:        Temp:  [98.7 F (37.1 C)-102.7 F (39.3 C)] 98.7 F (37.1 C) (11/28 1411) Pulse Rate:  [83-123] 85 (11/28 1400) Resp:  [17-20] 17 (11/28 1400) BP: (91-128)/(58-80) 107/71 (11/28 1400) SpO2:  [95 %-100 %] 97 % (11/28 1400) Weight:  [86.2 kg] 86.2 kg (11/27 1708) No intake/output data recorded. No intake/output data recorded. No intake or output data in the 24 hours ending 04/07/21 1413   Current Vital Signs 24h Vital Sign Ranges  T 98.7 F (37.1 C) Temp  Avg: 100.7 F (38.2 C)  Min: 98.7 F (37.1 C)  Max: 102.7 F (39.3 C)  BP 107/71 BP  Min: 91/62  Max:  128/80  HR 85 Pulse  Avg: 95.1  Min: 83  Max: 123  RR 17 Resp  Avg: 17.8  Min: 17  Max: 20  SaO2 97 % Room Air SpO2  Avg: 98.4 %  Min: 95 %  Max: 100 %       24 Hour I/O Current Shift I/O  Time Ins Outs No intake/output data recorded. No intake/output data recorded.   Patient Vitals for the past 12 hrs:  BP Temp Temp src Pulse Resp SpO2  04/07/21 1411 -- 98.7 F (37.1 C) Oral -- -- --  04/07/21 1400 107/71 -- -- 85 17 97 %  04/07/21 1345 110/61 -- -- 83 -- 98 %  04/07/21 1330 106/70 -- -- 85 -- 99 %  04/07/21 1315 106/69 -- -- 91 -- 100 %  04/07/21 1300 108/74 -- -- 97 17 100 %  04/07/21 1230 97/67 -- -- 97 -- 98 %  04/07/21 1215 93/65 -- -- 97 -- 98 %  04/07/21 1200 91/62 -- -- 93 18 99 %  04/07/21 1100 (!) 100/58 100 F (37.8 C) Oral 93 17 99 %  04/07/21 0830 102/60 (!) 101.3 F (38.5 C) Oral 96 17 99 %  04/07/21 0546 115/78 99.7 F (37.6 C) -- 94 18 98 %  04/07/21 0303 -- (!) 101.5 F (38.6 C) Oral -- -- --  Patient Vitals for the past 24 hrs:  BP Temp Temp src Pulse Resp SpO2 Height Weight  04/07/21 1411 -- 98.7 F (37.1 C) Oral -- -- -- -- --  04/07/21 1400 107/71 -- -- 85 17 97 % -- --  04/07/21 1345 110/61 -- -- 83 -- 98 % -- --  04/07/21 1330 106/70 -- -- 85 -- 99 % -- --  04/07/21 1315 106/69 -- -- 91 -- 100 % -- --  04/07/21 1300 108/74 -- -- 97 17 100 % -- --  04/07/21 1230 97/67 -- -- 97 -- 98 % -- --  04/07/21 1215 93/65 -- -- 97 -- 98 % -- --  04/07/21 1200 91/62 -- -- 93 18 99 % -- --  04/07/21 1100 (!) 100/58 100 F (37.8 C) Oral 93 17 99 % -- --  04/07/21 0830 102/60 (!) 101.3 F (38.5 C) Oral 96 17 99 % -- --  04/07/21 0546 115/78 99.7 F (37.6 C) -- 94 18 98 % -- --  04/07/21 0303 -- (!) 101.5 F (38.6 C) Oral -- -- -- -- --  04/07/21 0130 104/72 -- -- 90 -- 98 % -- --  04/07/21 0127 104/72 -- -- 93 18 98 % -- --  04/06/21 1900 128/80 -- -- (!) 110 -- 95 % -- --  04/06/21 1708 -- -- -- -- -- -- 6' (1.829 m) 86.2 kg  04/06/21 1703 126/73  (!) 102.7 F (39.3 C) Oral (!) 123 20 100 % -- --    Physical exam: General appearance: alert, cooperative, and appears stated age Abdomen:  mild moderate, LLQ pain, no distention GU: No gross VB Lungs: clear to auscultation bilaterally Heart: regular rate and rhythm Extremities: no lower extremity edema Skin: WNL Psych: appropriate Neurologic: Grossly normal  Medications Current Facility-Administered Medications  Medication Dose Route Frequency Provider Last Rate Last Admin   acetaminophen (TYLENOL) tablet 650 mg  650 mg Oral Q6H PRN Lenore Cordia, MD   650 mg at 04/07/21 3614   Or   acetaminophen (TYLENOL) suppository 650 mg  650 mg Rectal Q6H PRN Lenore Cordia, MD       busPIRone (BUSPAR) tablet 5 mg  5 mg Oral BID Lenore Cordia, MD   5 mg at 04/07/21 0914   HYDROcodone-acetaminophen (NORCO/VICODIN) 5-325 MG per tablet 1-2 tablet  1-2 tablet Oral Q4H PRN Lenore Cordia, MD   2 tablet at 04/07/21 1153   ondansetron (ZOFRAN) tablet 4 mg  4 mg Oral Q6H PRN Lenore Cordia, MD   4 mg at 04/07/21 1153   Or   ondansetron (ZOFRAN) injection 4 mg  4 mg Intravenous Q6H PRN Lenore Cordia, MD       piperacillin-tazobactam (ZOSYN) IVPB 3.375 g  3.375 g Intravenous Q8H Hayden Rasmussen, MD 12.5 mL/hr at 04/07/21 1250 3.375 g at 04/07/21 1250   senna-docusate (Senokot-S) tablet 1 tablet  1 tablet Oral QHS PRN Lenore Cordia, MD       Current Outpatient Medications  Medication Sig Dispense Refill   acetaminophen (TYLENOL) 500 MG tablet Take 500-1,000 mg by mouth every 6 (six) hours as needed for mild pain or headache.     busPIRone (BUSPAR) 5 MG tablet Take 1 tablet (5 mg total) by mouth 2 (two) times daily. 60 tablet 2   docusate sodium (COLACE) 250 MG capsule Take 250 mg by mouth daily.     ferrous sulfate (FERROUSUL) 325 (65 FE) MG tablet Take 1 tablet (325  mg total) by mouth 2 (two) times daily. 60 tablet 1   ibuprofen (ADVIL) 200 MG tablet Take 600 mg by mouth every 6 (six)  hours as needed for mild pain, headache or cramping.     metroNIDAZOLE (FLAGYL) 500 MG tablet Take 1 tablet (500 mg total) by mouth 2 (two) times daily. (Patient taking differently: Take 500 mg by mouth See admin instructions. Bid x 7 days) 14 tablet 0   vitamin B-12 (CYANOCOBALAMIN) 500 MCG tablet Take 1 tablet (500 mcg total) by mouth daily. 30 tablet 1   HYDROcodone-acetaminophen (NORCO/VICODIN) 5-325 MG tablet Take 1 tablet by mouth every 6 (six) hours as needed. (Patient not taking: Reported on 04/06/2021) 8 tablet 0      Labs  Recent Labs  Lab 04/02/21 1015 04/06/21 1710 04/07/21 0409  WBC 8.8 12.6* 10.5  HGB 9.5* 9.1* 8.5*  HCT 30.6* 28.0* 26.6*  PLT 225 285 200    Recent Labs  Lab 04/02/21 1015 04/06/21 1710 04/07/21 0409  NA 131* 128* 133*  K 3.1* 3.8 4.0  CL 96* 90* 99  CO2 23 29 28   BUN <5* 5* <5*  CREATININE 0.73 0.70 0.69  CALCIUM 8.7* 8.6* 8.2*  PROT 8.0 8.1 7.1  BILITOT 0.6 0.5 0.4  ALKPHOS 106 146* 125  ALT 23 59* 44  AST 20 35 24  GLUCOSE 115* 113* 94    Radiology MRI pending  Assessment & Plan:  Abdominal pain, fever, pelvic mass  Tumor markers ordered, LDH, inhibin B, CEA, AFP Pelvic MRI ordered Awaiting gyn onc input Spoke with hospitalist, but due to possibility of lymphoma, advised Heme/onc eval  Monitor WBC, monitor fever curve, continue abx. Reassess in AM  Code Status: Full Code   Lynnda Shields, MD Attending Center for [facility])

## 2021-04-07 NOTE — ED Notes (Signed)
Upto br c/o pain med given

## 2021-04-07 NOTE — Consult Note (Signed)
Gynecologic Oncology Consultation  Caitlin Maddox 40 y.o. female  CC:  Chief Complaint  Patient presents with   Abdominal Pain    HPI: Caitlin Maddox is a 39 year old female with a history of dysfunctional uterine bleeding and symptomatic anemia who presented to Urgent Care initially on 04/02/2021 with severe lower abdominal pain that had been present for the past 6 days. The pain was described as intermittent and sharp. She reported nausea with no emesis and no changes in her bowel habits such as constipation, diarrhea, blood in the stool. She reported an elevated temperature of 101 at home. Due to the severity of her pain and high risk history, it was recommended she seek care in the Emergency Room at [facility].  During ED evaluation on 04/02/2021, an ultrasound was performed revealing a large complex cystic mass in the pelvis with endometrial thickness of 21.2 mm. Her WBC count was 8.8, Hgb 9.5, and Hct 30.6. She was not admitted at this time and was recommended to follow up with GYN.    She re-presented to the ED on 04/06/2021 due to worsening abdominal pain, dyspnea, and fever. Her WBC count was 12.6, Hgb 9.1, and Hct 28.0. A CT scan was performed revealing trace R pleural effusion, splenomegaly with retroperitoneal and retrocrural lymphadenopathy concerning for lymphoma, interval development of a 9 cm fluid density lesion likely arising from the uterus/cervix. Blood cultures were drawn with no growth in 2 days on preliminary report. Lactic acid remained in normal range. CA 125 tumor marker returned at 25.9, CEA at 1.6, AFP at <1.8, Inhibin B pending.   She underwent a MRI of the pelvis on 04/07/2021 with the large 11.2 cm cystic lesion in the cul-de-sac noted with characteristics that could reflect hemorrhagic or infected contents.   Her history includes multiple ED visits and admissions. In the end of September 2022, she was admitted for cellulitis/abscess of the buttocks. At that time, she  received 1 unit of PRBCs along with IV iron due to anemia related to chronic blood loss. In May of 2022, she was also admitted for anemia secondary to iron deficiency related to menorrhagia and received 4 units of PRBCs. She reports a chronic history of heavier menses.     She reports having abnormal periods that started this year. She has always had heavy bleeding, but this year her menses have been heavier, more painful, blood changed colors, bleeding between periods with periods once or twice a month. The paraguard was put in more than 10 years ago with no history of hormonal BCP use. She came to the ED due to severe cramping, which caused her to be unable to move or walk to the bathroom at home. The fevers started on this past Friday and she has had a fever two nights while in hospital. She states she feels better today. Cramping is intermittent, period like, and reaches to the mid abdomen. Her last period stopped Nov 19-20 th and she has spotting currently.  She denies issues with her bladder and bowels. She denies vaginal discharge/pus. Major medical issues include chronic anemia. Past surgeries include 2 c sections. Family history includes lung cancer with maternal great grandfather-smoker.  She lives in [place] with her sister and sons. 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 She stopped smoking at the beginning of the year. Appetite has been decreased and started with pain, for about 2 weeks. Mother states weight loss in the past 3 months-down from 200 lbs.  She states she is not hungry and denies early satiety. She has had nausea related to the pain. No nausea at this time. She has been pregnant 3 times- 1 vaginal birth, 2 c sections. Her periods started at age 68 and were once a month but heavy. Denies hx of STDS. Last pap unknown (15 years ago with preg?). She denies abnormal paps in the past. No recent pelvic exams, with last around the birth of her twins. No pain or swelling in legs.  She reports feeling more tired recently.   Review of Systems  Constitutional: Feels tired. Intermittent elevated temperatures. Decreased appetite. Weight loss per mother.  Cardiovascular: No chest pain, shortness of breath, or edema.  Pulmonary: No cough or wheeze.  Gastrointestinal: Nausea on admission. No bright red blood per rectum or change in bowel movement.  Genitourinary: No frequency, urgency, or dysuria. Having vaginal spotting currently.  Musculoskeletal: No myalgia or joint pain. Neurologic: Difficulty ambulating due to pain prompting ED eval. No weakness, numbness, or change in gait.  Psychology: No depression, anxiety, or insomnia.  Health Maintenance: Mammogram: N/A Pap Smear: Possibly 15 year ago  Current Meds: Current medications reviewed. On zosyn.  Allergy:  Allergies  Allergen Reactions   Clindamycin/Lincomycin Anaphylaxis and Swelling    Social Hx:   Social History   Socioeconomic History   Marital status: Single    Spouse name: Not on file   Number of children: Not on file   Years of education: Not on file   Highest education level: Not on file  Occupational History   Occupation: CNA  Tobacco Use   Smoking status: Former    Packs/day: 1.00    Years: 33.00    Pack years: 33.00    Types: Cigarettes    Quit date: 10/09/2020    Years since quitting: 0.4   Smokeless tobacco: Never  Substance and Sexual Activity   Alcohol use: No   Drug use: No   Sexual activity: Not on file  Other Topics Concern   Not on file  Social History Narrative   Not on file   Social Determinants of Health   Financial Resource Strain: Not on file  Food Insecurity: Not on file  Transportation Needs: Not on file  Physical Activity: Not on file  Stress: Not on file  Social Connections: Not on file  Intimate Partner Violence: Not on file    Past Surgical Hx:  Past Surgical History:  Procedure Laterality Date   CESAREAN SECTION     CESAREAN SECTION      Past  Medical Hx:  Past Medical History:  Diagnosis Date   Dysfunctional uterine bleeding    Symptomatic anemia     Family Hx:  Family History  Problem Relation Age of Onset   Hypertension Mother     Vitals:  Blood pressure 107/71, pulse 85, temperature 98.7 F (37.1 C), temperature source Oral, resp. rate 17, height 6' (1.829 m), weight 190 lb 0.6 oz (86.2 kg), last menstrual period 03/28/2021, SpO2 97 %.  CBC    Component Value Date/Time   WBC 6.4 04/08/2021 0122   RBC 2.69 (L) 04/08/2021 0122   HGB 7.8 (L) 04/08/2021 0122   HCT 24.6 (L) 04/08/2021 0122   PLT 220 04/08/2021 0122   MCV 91.4 04/08/2021 0122   MCH 29.0 04/08/2021 0122   MCHC 31.7 04/08/2021 0122   RDW 19.8 (H) 04/08/2021 0122   LYMPHSABS 1.4 04/08/2021 0122   MONOABS 0.8 04/08/2021 0122   EOSABS 0.0  04/08/2021 0122   BASOSABS 0.0 04/08/2021 0122   BMET    Component Value Date/Time   NA 133 (L) 04/08/2021 0122   K 3.8 04/08/2021 0122   CL 99 04/08/2021 0122   CO2 27 04/08/2021 0122   GLUCOSE 97 04/08/2021 0122   BUN <5 (L) 04/08/2021 0122   CREATININE 0.67 04/08/2021 0122   CALCIUM 8.2 (L) 04/08/2021 0122   GFRNONAA >60 04/08/2021 0122    MRI Pelvis 04/07/2021: IMPRESSION: 1. Large cystic lesion in the cul-de-sac has a fluid-fluid level and only mild marginal enhancement although does have some nodular enhancing components along the left anterior margin and to a lesser extent along the right anterior margin adjacent to the ovaries. Given that this is new compared to 02/01/2021, I am skeptical that it represents an ovarian malignancy. More likely this is some form of hemorrhagic cystic lesion or less likely abscess in the cul-de-sac region. A could be arising from either ovary based on the abutment of both ovaries. There is edema tracking around both ovaries and in the pelvic sidewalls which is nonspecific, along with nonspecific presacral edema. Presumably this could be related to inflammation in the region,  and if the has fever then the possibility of a large tubo-ovarian abscess extending in the cul-de-sac should be considered. 2. Mildly complex cystic lesion but without internal nodular enhancement anteriorly in the right ovary, probably a benign complex cyst given the thin marginal enhancement. 3. Notable uterine fibroids including a lower uterine segment subserosal fibroid. 4. Borderline right external iliac adenopathy, nonspecific.  Physical Exam: Alert, oriented x3, in no acute distress Lungs clear. Heart regular in rate and rhythm Active bowel sounds. Tenderness reported more in the LLQ up to mid abdomen. No lower extrem edema noted.  Assessment/Plan: See above for Dr. Charisse March assessment and recommendations.  Dorothyann Gibbs, NP 04/07/2021, 4:06 PM

## 2021-04-07 NOTE — ED Notes (Signed)
Breakfast orders placed 

## 2021-04-08 DIAGNOSIS — R1900 Intra-abdominal and pelvic swelling, mass and lump, unspecified site: Secondary | ICD-10-CM | POA: Diagnosis not present

## 2021-04-08 DIAGNOSIS — D500 Iron deficiency anemia secondary to blood loss (chronic): Secondary | ICD-10-CM | POA: Diagnosis not present

## 2021-04-08 DIAGNOSIS — R599 Enlarged lymph nodes, unspecified: Secondary | ICD-10-CM

## 2021-04-08 DIAGNOSIS — E8809 Other disorders of plasma-protein metabolism, not elsewhere classified: Secondary | ICD-10-CM | POA: Diagnosis not present

## 2021-04-08 DIAGNOSIS — E46 Unspecified protein-calorie malnutrition: Secondary | ICD-10-CM

## 2021-04-08 DIAGNOSIS — R509 Fever, unspecified: Secondary | ICD-10-CM

## 2021-04-08 DIAGNOSIS — N939 Abnormal uterine and vaginal bleeding, unspecified: Secondary | ICD-10-CM

## 2021-04-08 DIAGNOSIS — R161 Splenomegaly, not elsewhere classified: Secondary | ICD-10-CM

## 2021-04-08 LAB — CBC WITH DIFFERENTIAL/PLATELET
Abs Immature Granulocytes: 0.08 10*3/uL — ABNORMAL HIGH (ref 0.00–0.07)
Basophils Absolute: 0 10*3/uL (ref 0.0–0.1)
Basophils Relative: 1 %
Eosinophils Absolute: 0 10*3/uL (ref 0.0–0.5)
Eosinophils Relative: 1 %
HCT: 24.6 % — ABNORMAL LOW (ref 36.0–46.0)
Hemoglobin: 7.8 g/dL — ABNORMAL LOW (ref 12.0–15.0)
Immature Granulocytes: 1 %
Lymphocytes Relative: 22 %
Lymphs Abs: 1.4 10*3/uL (ref 0.7–4.0)
MCH: 29 pg (ref 26.0–34.0)
MCHC: 31.7 g/dL (ref 30.0–36.0)
MCV: 91.4 fL (ref 80.0–100.0)
Monocytes Absolute: 0.8 10*3/uL (ref 0.1–1.0)
Monocytes Relative: 13 %
Neutro Abs: 4 10*3/uL (ref 1.7–7.7)
Neutrophils Relative %: 62 %
Platelets: 220 10*3/uL (ref 150–400)
RBC: 2.69 MIL/uL — ABNORMAL LOW (ref 3.87–5.11)
RDW: 19.8 % — ABNORMAL HIGH (ref 11.5–15.5)
WBC: 6.4 10*3/uL (ref 4.0–10.5)
nRBC: 0.3 % — ABNORMAL HIGH (ref 0.0–0.2)

## 2021-04-08 LAB — COMPREHENSIVE METABOLIC PANEL
ALT: 35 U/L (ref 0–44)
AST: 24 U/L (ref 15–41)
Albumin: 2.3 g/dL — ABNORMAL LOW (ref 3.5–5.0)
Alkaline Phosphatase: 98 U/L (ref 38–126)
Anion gap: 7 (ref 5–15)
BUN: <5 mg/dL — ABNORMAL LOW (ref 6–20)
CO2: 27 mmol/L (ref 22–32)
Calcium: 8.2 mg/dL — ABNORMAL LOW (ref 8.9–10.3)
Chloride: 99 mmol/L (ref 98–111)
Creatinine, Ser: 0.67 mg/dL (ref 0.44–1.00)
GFR, Estimated: >60 mL/min (ref >60)
Glucose, Bld: 97 mg/dL (ref 70–99)
Potassium: 3.8 mmol/L (ref 3.5–5.1)
Sodium: 133 mmol/L — ABNORMAL LOW (ref 135–145)
Total Bilirubin: 0.2 mg/dL — ABNORMAL LOW (ref 0.3–1.2)
Total Protein: 7 g/dL (ref 6.5–8.1)

## 2021-04-08 LAB — MAGNESIUM: Magnesium: 2.1 mg/dL (ref 1.7–2.4)

## 2021-04-08 LAB — CEA: CEA: 1.6 ng/mL (ref 0.0–4.7)

## 2021-04-08 LAB — LACTIC ACID, PLASMA
Lactic Acid, Venous: 0.8 mmol/L (ref 0.5–1.9)
Lactic Acid, Venous: 1.2 mmol/L (ref 0.5–1.9)

## 2021-04-08 LAB — PROCALCITONIN
Procalcitonin: 0.51 ng/mL
Procalcitonin: 0.2 ng/mL

## 2021-04-08 LAB — PHOSPHORUS: Phosphorus: 3.2 mg/dL (ref 2.5–4.6)

## 2021-04-08 LAB — AFP TUMOR MARKER: AFP, Serum, Tumor Marker: <1.8 ng/mL (ref 0.0–6.4)

## 2021-04-08 LAB — CA 125: Cancer Antigen (CA) 125: 25.9 U/mL (ref 0.0–38.1)

## 2021-04-08 MED ORDER — ENSURE ENLIVE PO LIQD
237.0000 mL | Freq: Two times a day (BID) | ORAL | Status: DC
  Administered 2021-04-09 – 2021-04-10 (×4): 237 mL via ORAL

## 2021-04-08 NOTE — Progress Notes (Signed)
Mobility Specialist Progress Note:   04/08/21 1500  Mobility  Activity Ambulated in room  Level of Assistance Independent  Assistive Device None  Distance Ambulated (ft) 50 ft  Mobility Ambulated independently in room  Mobility Response Tolerated well  Mobility performed by Mobility specialist  $Mobility charge 1 Mobility   Pt asx during ambulation. States she cant bend over without having abdomen pain.   Nelta Numbers Mobility Specialist  Phone [phone]

## 2021-04-08 NOTE — Progress Notes (Signed)
Gynecology Progress Note  Admission Date: 04/06/2021 Current Date: 04/08/2021 12:51 PM  Caitlin Maddox is a 40 y.o. G3P3000 HD#2 admitted for fever and abdominal pain   History complicated by: Patient Active Problem List   Diagnosis Date Noted   Mass of uterine cervix 04/06/2021   Anemia due to blood loss, chronic 04/06/2021   Hyponatremia 04/06/2021   Splenomegaly 04/06/2021   Pelvic mass 04/06/2021   Cellulitis 02/02/2021   Cellulitis and abscess of buttock 02/01/2021   Dysfunctional uterine bleeding 02/01/2021   Symptomatic anemia 10/04/2020    ROS and patient/family/surgical history, located on admission H&P note dated 04/06/2021, have been reviewed, and there are no changes except as noted below Yesterday/Overnight Events:  None significant  Subjective:  Pt seen.  She remains stable.  Pt still notes intermittent lower abdominal pain.  She is able to eat and drink without difficulty.  She denies nausea and vomiting.  Pt has normal bowel function and is voiding without difficulty.  The patient does report new back pain which she states "feels like back is breaking."  She also feels some pressure on her legs.  Pt also reports intermittent bouts of fever.  Objective:   Vitals:   04/07/21 2033 04/08/21 0019 04/08/21 0506 04/08/21 0905  BP: (!) 101/59 102/61 113/68 101/74  Pulse: 97 84 83 80  Resp: 16 18 19 18   Temp: (!) 100.7 F (38.2 C) 98.4 F (36.9 C) 98.8 F (37.1 C) 98.3 F (36.8 C)  TempSrc: Oral Oral Oral Oral  SpO2: 98% 99% 100% 100%  Weight:      Height:        Temp:  [98.3 F (36.8 C)-100.7 F (38.2 C)] 98.3 F (36.8 C) (11/29 0905) Pulse Rate:  [80-97] 80 (11/29 0905) Resp:  [16-19] 18 (11/29 0905) BP: (101-113)/(59-74) 101/74 (11/29 0905) SpO2:  [97 %-100 %] 100 % (11/29 0905) I/O last 3 completed shifts: In: 220.4 [IV Piggyback:220.4] Out: -  No intake/output data recorded.  Intake/Output Summary (Last 24 hours) at 04/08/2021 1251 Last data filed  at 04/08/2021 0640 Gross per 24 hour  Intake 220.44 ml  Output --  Net 220.44 ml     Current Vital Signs 24h Vital Sign Ranges  T 98.3 F (36.8 C) Temp  Avg: 99 F (37.2 C)  Min: 98.3 F (36.8 C)  Max: 100.7 F (38.2 C)  BP 101/74 BP  Min: 101/74  Max: 113/68  HR 80 Pulse  Avg: 87.2  Min: 80  Max: 97  RR 18 Resp  Avg: 17.5  Min: 16  Max: 19  SaO2 100 % Room Air SpO2  Avg: 99 %  Min: 97 %  Max: 100 %       24 Hour I/O Current Shift I/O  Time Ins Outs 11/28 0701 - 11/29 0700 In: 220.4  Out: -  No intake/output data recorded.   Patient Vitals for the past 12 hrs:  BP Temp Temp src Pulse Resp SpO2  04/08/21 0905 101/74 98.3 F (36.8 C) Oral 80 18 100 %  04/08/21 0506 113/68 98.8 F (37.1 C) Oral 83 19 100 %     Patient Vitals for the past 24 hrs:  BP Temp Temp src Pulse Resp SpO2  04/08/21 0905 101/74 98.3 F (36.8 C) Oral 80 18 100 %  04/08/21 0506 113/68 98.8 F (37.1 C) Oral 83 19 100 %  04/08/21 0019 102/61 98.4 F (36.9 C) Oral 84 18 99 %  04/07/21 2033 (!) 101/59 (!) 100.7  F (38.2 C) Oral 97 16 98 %  04/07/21 1411 -- 98.7 F (37.1 C) Oral -- -- --  04/07/21 1400 107/71 -- -- 85 17 97 %  04/07/21 1345 110/61 -- -- 83 -- 98 %  04/07/21 1330 106/70 -- -- 85 -- 99 %  04/07/21 1315 106/69 -- -- 91 -- 100 %  04/07/21 1300 108/74 -- -- 97 17 100 %    Physical exam: General appearance: alert, cooperative, appears stated age, and no distress Abdomen:  soft, diffuse mild lower quadrant tenderness, nondistended GU: No gross VB Lungs: clear to auscultation bilaterally Heart: regular rate and rhythm Extremities: no lower extremity edema Skin: WNL Psych: appropriate Neurologic: Grossly normal  Medications Current Facility-Administered Medications  Medication Dose Route Frequency Provider Last Rate Last Admin   acetaminophen (TYLENOL) tablet 650 mg  650 mg Oral Q6H PRN Lenore Cordia, MD   650 mg at 04/07/21 2123   Or   acetaminophen (TYLENOL) suppository 650  mg  650 mg Rectal Q6H PRN Lenore Cordia, MD       alum & mag hydroxide-simeth (MAALOX/MYLANTA) 200-200-20 MG/5ML suspension 30 mL  30 mL Oral Q6H PRN Allie Bossier, MD   30 mL at 04/07/21 2002   busPIRone (BUSPAR) tablet 5 mg  5 mg Oral BID Lenore Cordia, MD   5 mg at 04/08/21 0918   HYDROcodone-acetaminophen (NORCO/VICODIN) 5-325 MG per tablet 1-2 tablet  1-2 tablet Oral Q4H PRN Lenore Cordia, MD   2 tablet at 04/08/21 0919   ondansetron (ZOFRAN) tablet 4 mg  4 mg Oral Q6H PRN Lenore Cordia, MD   4 mg at 04/07/21 1153   Or   ondansetron (ZOFRAN) injection 4 mg  4 mg Intravenous Q6H PRN Lenore Cordia, MD       piperacillin-tazobactam (ZOSYN) IVPB 3.375 g  3.375 g Intravenous Q8H Hayden Rasmussen, MD 12.5 mL/hr at 04/08/21 1220 3.375 g at 04/08/21 1220   senna-docusate (Senokot-S) tablet 1 tablet  1 tablet Oral QHS PRN Lenore Cordia, MD          Labs  Recent Labs  Lab 04/06/21 1710 04/07/21 0409 04/08/21 0122  WBC 12.6* 10.5 6.4  HGB 9.1* 8.5* 7.8*  HCT 28.0* 26.6* 24.6*  PLT 285 200 220    Recent Labs  Lab 04/06/21 1710 04/07/21 0409 04/08/21 0122  NA 128* 133* 133*  K 3.8 4.0 3.8  CL 90* 99 99  CO2 29 28 27   BUN 5* <5* <5*  CREATININE 0.70 0.69 0.67  CALCIUM 8.6* 8.2* 8.2*  PROT 8.1 7.1 7.0  BILITOT 0.5 0.4 0.2*  ALKPHOS 146* 125 98  ALT 59* 44 35  AST 35 24 24  GLUCOSE 113* 94 97    Radiology CLINICAL DATA:  Pelvic/adnexal masses   EXAM: MRI PELVIS WITHOUT AND WITH CONTRAST   TECHNIQUE: Multiplanar multisequence MR imaging of the pelvis was performed both before and after administration of intravenous contrast.   CONTRAST:  8.40mL GADAVIST GADOBUTROL 1 MMOL/ML IV SOLN   COMPARISON:  CT scan 04/06/2021.  Pelvic ultrasound 04/02/2021   FINDINGS: Urinary Tract:  Urinary bladder unremarkable   Bowel:  No abnormality observed   Vascular/Lymphatic: Right external iliac node 1.0 cm in short axis on image 44 series 11. The right external iliac  vein is somewhat narrowed by the adjacent ovary.   Reproductive: 2 dominant low signal intensity uterine fibroids are identified, anterior fibroid 4.8 by 4.5 by 5.0 cm and  the more posterior fibroid measuring 4.7 by 3.8 by 3.2 cm. This more posterior lower uterine segment fibroid has a submucosal component. The endometrium does not appear thickened. There is low T2 signal intensity throughout the myometrium and adenomyosis is not excluded.   The right ovary measures about 5.0 by 2.4 by 4.0 cm, and appears to contain a 2.3 by 2.0 by 2.5 cm anterior cystic lesion with only marginal enhancement. This lesion has mixed precontrast T2 signal but primarily high, and is likely a mildly complex cyst.   The left ovary measures approximately 3.6 by 1.8 by 2.7 cm.   In the cul-de-sac region and abutting both ovaries, we demonstrate a 11.2 by 8.8 by 9.7 cm cystic lesion with an internal fluid-fluid level which could reflect hemorrhagic or infected contents. This only has fairly thin marginal enhancement which would seemingly be unusual for infected lesion. There is some enhancing nodularity along the left anterior margin on image 39 of series 11 such that a small soft tissue nodule in this lesion is not excluded.   Other: On T2 weighted images, there appears to be some retroperitoneal edema extending along the pelvic sidewalls, and also edema in the presacral space, as shown for example on series 6.   Musculoskeletal: No bony abnormality is observed.   IMPRESSION: 1. Large cystic lesion in the cul-de-sac has a fluid-fluid level and only mild marginal enhancement although does have some nodular enhancing components along the left anterior margin and to a lesser extent along the right anterior margin adjacent to the ovaries. Given that this is new compared to 02/01/2021, I am skeptical that it represents an ovarian malignancy. More likely this is some form of hemorrhagic cystic lesion or less  likely abscess in the cul-de-sac region. A could be arising from either ovary based on the abutment of both ovaries. There is edema tracking around both ovaries and in the pelvic sidewalls which is nonspecific, along with nonspecific presacral edema. Presumably this could be related to inflammation in the region, and if the has fever then the possibility of a large tubo-ovarian abscess extending in the cul-de-sac should be considered. 2. Mildly complex cystic lesion but without internal nodular enhancement anteriorly in the right ovary, probably a benign complex cyst given the thin marginal enhancement. 3. Notable uterine fibroids including a lower uterine segment subserosal fibroid. 4. Borderline right external iliac adenopathy, nonspecific.     Assessment & Plan:  HD #2, pelvic mass, fever abdominal pain Pt continues to have intermittent fevers.  This would be worrisome for abscess or some other infectious process.  WBC has improved with antibiotics.  GC/C cultures were negative, and blood cultures currently show no growth.  Pt will be seen by Gyn oncology this afternoon, but current correspondence suggests malignant etiology is less likely.  Tumor markers are currently negative.  If consensus is this process is more infectious, I would suggest interventional radiology consult for possible CT guided drainage with cultures.  Would avoid surgery if possible due to concern for more complications with a currently infected pelvis.  Code Status: Full Code  Lynnda Shields, MD Attending Center for [place])

## 2021-04-08 NOTE — Progress Notes (Signed)
PROGRESS NOTE    Caitlin Maddox  EXB:[id number] DOB: January 29, 1981 DOA: 04/06/2021 PCP: Kerin Perna, NP     Brief Narrative:  40 y.o. 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  Patient had appointment with OB/GYN however given return of abdominal pain she presented to the ED again for further evaluation.  Patient reports about 1 week of suprapubic/lower abdominal pain.  Initially presented to ED 04/02/2021.  Pelvic ultrasound showed a large complex except mass in the left adnexa measuring at least 11.4 cm concerning for ovarian neoplasm. Patient was discharged to home with OB/GYN follow-up and pain medication.  Pain returned today therefore she returned to the ED.  She has been having subjective fevers, chills, diaphoresis, and an episode of transient shortness of breath after today.  She says she has pleuritic discomfort with deep inspiration.   Patient reports chronic heavy menses, last period on 11/04-2012.  She says she did have breakthrough bleeding earlier today which is unusual.   She reports a prior history of tobacco use, 1 pack/day since age of 51 until she quit last year.  She denies any alcohol or illicit drug use.  She reports a history of hypertension in her mother.   ED Course:  Initial vitals showed BP 126/73, pulse 123, RR 20, temp 102.7 F, SPO2 100% on room air.   Labs show WBC 12.6, hemoglobin 9.1, platelets 285,000, sodium 128, potassium 3.8, bicarb 29, BUN 5, creatinine 0.70, serum glucose 113, AST 35, ALT 59, alk phos 146, total bilirubin 0.5, lipase 34.  Lactic acid 0.9.   Urinalysis shows negative nitrites, small leukocytes, 0-5 RBC/hpf, 11-20 WBC/hpf, no bacteria seen on microscopy.  I-STAT beta-hCG <5.0.   COVID and influenza PCR negative.  Blood cultures collected and pending.   2 view chest x-ray showed minimal left lower lobe patchy opacity.   CT abdomen/pelvis with contrast showed trace right pleural effusion with bibasilar atelectasis.  Splenomegaly with retroperitoneal and retrocrural lymphadenopathy concerning for lymphoproliferative disorder/lymphoma seen.  Interval development of 9 cm fluid density lesion likely arising from the uterus/cervix and found to be inseparable from both the uterus/cervix as well as rectum was noted.  IUD felt to be malpositioned.   Patient was given 1 L normal saline, IV morphine, Tylenol.  OB/GYN was consulted.  They have removed IUD and recommended starting on IV Zosyn.  Medical admission was recommended and the hospitalist service was consulted to admit for further evaluation and management.     Subjective: 11/29 T-max overnight 38.5 C.    afebrile, A/O x4, positive abdominal pain   Assessment & Plan: Covid vaccination; vaccinated 3/3   Principal Problem:   Mass of uterine cervix Active Problems:   Anemia due to blood loss, chronic   Hyponatremia   Splenomegaly   Pelvic mass  Mass of uterus/cervix: 9 cm of lesion arising from uterus/cervix, inseparable from uterus/cervix as well as rectum seen on CT imaging.  OB/GYN following, low suspicion for PID or TOA but recommend empiric antibiotics with IV Zosyn. -OB/GYN following, they will notify Gyn/Onc in AM to review case -Continue Zosyn -CA125 ordered -Follow blood cultures -11/28 discussed case with Dr. Elgie Congo gynecology oncology, they are waiting stat MRI of abdomen and pelvis to be completed.  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 - 11/28 also discussed case with NP [facility] gynecologic oncology who will evaluate patient today.  We will need to determine best course of action for obtaining biopsy. -11/29 evaluated by Dr. Shaaron Adler gynecologic oncology her recommendations are as  follows;  - IV antibiotics for the next 48 hours. If her fevers stop, then I think we can transition her to oral antibiotics with a plan for outpatient surgery in several weeks after she has been cooled off.  -She spoke with Dr. Donavan Foil gynecologist about doing an endometrial biopsy and a Pap smear while she has her in the hospital.  -In the setting of her malnutrition and weight loss, we ordered nutritional supplements.  -She is high risk for surgery now given malnutrition and infection.     Splenomegaly with retroperitoneal and retrocrural lymphadenopathy: Findings seen on CT A/P concerning for lymphoproliferative disorder/lymphoma. -Check peripheral smear -Follow CBC with differential -PET/CT recommended, this will have to be done on outpatient basis   SIRS -On admission met criteria for SIRS WBC> 12 K, temp> 38 C.  No known infection source.  Most likely secondary to malignancy Lab Results  Component Value Date   WBC 6.4 04/08/2021   WBC 10.5 04/07/2021   WBC 12.6 (H) 04/06/2021   WBC 8.8 04/02/2021   WBC 4.9 02/05/2021  - Cultures pending -11/28 procalcitonin 0.34 - 11/29 procalcitonin/lactic acid pending -11/29 continue empiric antibiotic Could be related to underlying infection versus potential malignancy. -Continue Zosyn as above, follow blood cultures   Hyponatremia: Mild, continue IV NS@100  mL/hr and follow labs.   Anemia due to chronic blood loss in setting of fibroid uterus:  Lab Results  Component Value Date   HGB 7.8 (L) 04/08/2021   HGB 8.5 (L) 04/07/2021   HGB 9.1 (L) 04/06/2021   HGB 9.5 (L) 04/02/2021   HGB 8.0 (L) 02/05/2021  -Hemoglobin currently stable.  Continue to monitor. -Transfuse for hemoglobin<7   Malpositioned IUD: -IUD removed by OB/GYN in the ED.   Generalized anxiety disorder: -Continue BuSpar.        DVT prophylaxis: SCD Code Status: Full Family Communication: 11/28 mother at bedside for discussion of plan of care all questions  answered Status is: Inpatient    Dispo: The patient is from: Home              Anticipated d/c is to: Home              Anticipated d/c date is: 3 days              Patient currently is not medically stable to d/c.      Consultants:  Gynecology oncology Dr. Shaaron Adler  Gynecology Dr. Donavan Foil  Procedures/Significant Events:    I have personally reviewed and interpreted all radiology studies and my findings are as above.  VENTILATOR SETTINGS:    Cultures   Antimicrobials: Anti-infectives (From admission, onward)    Start     Ordered Stop   04/06/21 2100  piperacillin-tazobactam (ZOSYN) IVPB 3.375 g        04/06/21 2050           Devices    LINES / TUBES:      Continuous Infusions:  piperacillin-tazobactam (ZOSYN)  IV 3.375 g (04/08/21 0458)     Objective: Vitals:   04/07/21 1411 04/07/21 2033 04/08/21 0019 04/08/21 0506  BP:  (!) 101/59 102/61 113/68  Pulse:  97 84 83  Resp:  16 18 19   Temp: 98.7 F (  37.1 C) (!) 100.7 F (38.2 C) 98.4 F (36.9 C) 98.8 F (37.1 C)  TempSrc: Oral Oral Oral Oral  SpO2:  98% 99% 100%  Weight:      Height:        Intake/Output Summary (Last 24 hours) at 04/08/2021 3557 Last data filed at 04/08/2021 3220 Gross per 24 hour  Intake 220.44 ml  Output --  Net 220.44 ml    Filed Weights   04/06/21 1708  Weight: 86.2 kg    Examination:  General: A/O x4, No acute respiratory distress Eyes: negative scleral hemorrhage, negative anisocoria, negative icterus ENT: Negative Runny nose, negative gingival bleeding, Neck:  Negative scars, masses, torticollis, lymphadenopathy, JVD Lungs: Clear to auscultation bilaterally without wheezes or crackles Cardiovascular: Regular rate and rhythm without murmur gallop or rub normal S1 and S2 Abdomen: Positive abdominal pain RLQ/LLQ to palpation, nondistended, positive soft, bowel sounds, no rebound, no ascites, no appreciable mass Extremities: No significant cyanosis,  clubbing, or edema bilateral lower extremities Skin: Negative rashes, lesions, ulcers Psychiatric:  Negative depression, negative anxiety, negative fatigue, negative mania  Central nervous system:  Cranial nerves II through XII intact, tongue/uvula midline, all extremities muscle strength 5/5, sensation intact throughout, negative dysarthria, negative expressive aphasia, negative receptive aphasia.  .     Data Reviewed: Care during the described time interval was provided by me .  I have reviewed this patient's available data, including medical history, events of note, physical examination, and all test results as part of my evaluation.  CBC: Recent Labs  Lab 04/02/21 1015 04/06/21 1710 04/07/21 0409 04/08/21 0122  WBC 8.8 12.6* 10.5 6.4  NEUTROABS  --  9.8* 8.1* 4.0  HGB 9.5* 9.1* 8.5* 7.8*  HCT 30.6* 28.0* 26.6* 24.6*  MCV 91.3 90.0 89.9 91.4  PLT 225 285 200 254    Basic Metabolic Panel: Recent Labs  Lab 04/02/21 1015 04/06/21 1710 04/07/21 0409 04/08/21 0122  NA 131* 128* 133* 133*  K 3.1* 3.8 4.0 3.8  CL 96* 90* 99 99  CO2 $Re'23 29 28 27  'Ovr$ GLUCOSE 115* 113* 94 97  BUN <5* 5* <5* <5*  CREATININE 0.73 0.70 0.69 0.67  CALCIUM 8.7* 8.6* 8.2* 8.2*  MG  --   --   --  2.1  PHOS  --   --   --  3.2    GFR: Estimated Creatinine Clearance: 109 mL/min (by C-G formula based on SCr of 0.67 mg/dL). Liver Function Tests: Recent Labs  Lab 04/02/21 1015 04/06/21 1710 04/07/21 0409 04/08/21 0122  AST 20 35 24 24  ALT 23 59* 44 35  ALKPHOS 106 146* 125 98  BILITOT 0.6 0.5 0.4 0.2*  PROT 8.0 8.1 7.1 7.0  ALBUMIN 3.1* 2.7* 2.4* 2.3*    Recent Labs  Lab 04/02/21 1015 04/06/21 1710  LIPASE 20 34    No results for input(s): AMMONIA in the last 168 hours. Coagulation Profile: No results for input(s): INR, PROTIME in the last 168 hours. Cardiac Enzymes: No results for input(s): CKTOTAL, CKMB, CKMBINDEX, TROPONINI in the last 168 hours. BNP (last 3 results) No results  for input(s): PROBNP in the last 8760 hours. HbA1C: No results for input(s): HGBA1C in the last 72 hours. CBG: No results for input(s): GLUCAP in the last 168 hours. Lipid Profile: No results for input(s): CHOL, HDL, LDLCALC, TRIG, CHOLHDL, LDLDIRECT in the last 72 hours. Thyroid Function Tests: No results for input(s): TSH, T4TOTAL, FREET4, T3FREE, THYROIDAB in the last 72 hours. Anemia Panel:  No results for input(s): VITAMINB12, FOLATE, FERRITIN, TIBC, IRON, RETICCTPCT in the last 72 hours. Sepsis Labs: Recent Labs  Lab 04/06/21 1710 04/06/21 1910 04/07/21 0836 04/07/21 1147 04/08/21 0122  PROCALCITON  --   --  0.34  --  0.51  LATICACIDVEN 0.9 0.7 0.8 1.1  --      Recent Results (from the past 240 hour(s))  Wet prep, genital     Status: Abnormal   Collection Time: 04/02/21  7:51 PM   Specimen: PATH Cytology Cervicovaginal Ancillary Only  Result Value Ref Range Status   Yeast Wet Prep HPF POC NONE SEEN NONE SEEN Final   Trich, Wet Prep NONE SEEN NONE SEEN Final   Clue Cells Wet Prep HPF POC PRESENT (A) NONE SEEN Final   WBC, Wet Prep HPF POC <10 <10 Final    Comment: Please note change in reference range.   Sperm NONE SEEN  Final    Comment: Performed at [facility], [address], [place], [place] [address]  Blood culture (routine x 2)     Status: None (Preliminary result)   Collection Time: 04/06/21  4:54 PM   Specimen: BLOOD  Result Value Ref Range Status   Specimen Description BLOOD RIGHT ANTECUBITAL  Final   Special Requests   Final    BOTTLES DRAWN AEROBIC AND ANAEROBIC Blood Culture adequate volume   Culture   Final    NO GROWTH 2 DAYS Performed at [facility], [place] [address] [place], [place] [address]    Report Status PENDING  Incomplete  Resp Panel by RT-PCR (Flu A&B, Covid) Nasopharyngeal Swab     Status: None   Collection Time: 04/06/21  5:57 PM   Specimen: Nasopharyngeal Swab; Nasopharyngeal(NP) swabs in vial transport medium  Result  Value Ref Range Status   SARS Coronavirus 2 by RT PCR NEGATIVE NEGATIVE Final    Comment: (NOTE) SARS-CoV-2 target nucleic acids are NOT DETECTED.  The SARS-CoV-2 RNA is generally detectable in upper respiratory specimens during the acute phase of infection. The lowest concentration of SARS-CoV-2 viral copies this assay can detect is 138 copies/mL. A negative result does not preclude SARS-Cov-2 infection and should not be used as the sole basis for treatment or other patient management decisions. A negative result may occur with  improper specimen collection/handling, submission of specimen other than nasopharyngeal swab, presence of viral mutation(s) within the areas targeted by this assay, and inadequate number of viral copies(<138 copies/mL). A negative result must be combined with clinical observations, patient history, and epidemiological information. The expected result is Negative.  Fact Sheet for Patients:  EntrepreneurPulse.com.au  Fact Sheet for Healthcare Providers:  IncredibleEmployment.be  This test is no t yet approved or cleared by the [place] FDA and  has been authorized for detection and/or diagnosis of SARS-CoV-2 by FDA under an Emergency Use Authorization (EUA). This EUA will remain  in effect (meaning this test can be used) for the duration of the COVID-19 declaration under Section 564(b)(1) of the Act, 21 U.S.C.section 360bbb-3(b)(1), unless the authorization is terminated  or revoked sooner.       Influenza A by PCR NEGATIVE NEGATIVE Final   Influenza B by PCR NEGATIVE NEGATIVE Final    Comment: (NOTE) The Xpert Xpress SARS-CoV-2/FLU/RSV plus assay is intended as an aid in the diagnosis of influenza from Nasopharyngeal swab specimens and should not be used as a sole basis for treatment. Nasal washings and aspirates are unacceptable for Xpert Xpress SARS-CoV-2/FLU/RSV testing.  Fact Sheet for  Patients: EntrepreneurPulse.com.au  Fact Sheet for Healthcare Providers: IncredibleEmployment.be  This test is not yet approved or cleared by the [place] FDA and has been authorized for detection and/or diagnosis of SARS-CoV-2 by FDA under an Emergency Use Authorization (EUA). This EUA will remain in effect (meaning this test can be used) for the duration of the COVID-19 declaration under Section 564(b)(1) of the Act, 21 U.S.C. section 360bbb-3(b)(1), unless the authorization is terminated or revoked.  Performed at [facility], [place] [address], [place], [place] [address]   Blood culture (routine x 2)     Status: None (Preliminary result)   Collection Time: 04/06/21  6:58 PM   Specimen: BLOOD  Result Value Ref Range Status   Specimen Description BLOOD RIGHT ANTECUBITAL  Final   Special Requests   Final    BOTTLES DRAWN AEROBIC AND ANAEROBIC Blood Culture results may not be optimal due to an inadequate volume of blood received in culture bottles   Culture   Final    NO GROWTH 2 DAYS Performed at [facility], [place] [address] [place], [place] [address]    Report Status PENDING  Incomplete          Radiology Studies: DG Chest 2 View  Result Date: 04/06/2021 CLINICAL DATA:  Shortness of breath and cough. EXAM: CHEST - 2 VIEW COMPARISON:  Chest x-ray 03/17/2021 FINDINGS: There is some minimal strandy and patchy opacities in the left lung base seen best on the lateral view. There is no pleural effusion or pneumothorax. Cardiomediastinal silhouette is within normal limits. No acute fractures are seen. IMPRESSION: 1. Minimal left lower lobe patchy opacities worrisome for infection. 2. Followup PA and lateral chest X-ray is recommended in 3-4 weeks following trial of antibiotic therapy to ensure resolution and exclude underlying malignancy. Electronically Signed   By: Ronney Asters M.D.   On: 04/06/2021 17:41   MR PELVIS W WO  CONTRAST  Result Date: 04/07/2021 CLINICAL DATA:  Pelvic/adnexal masses EXAM: MRI PELVIS WITHOUT AND WITH CONTRAST TECHNIQUE: Multiplanar multisequence MR imaging of the pelvis was performed both before and after administration of intravenous contrast. CONTRAST:  8.8mL GADAVIST GADOBUTROL 1 MMOL/ML IV SOLN COMPARISON:  CT scan 04/06/2021.  Pelvic ultrasound 04/02/2021 FINDINGS: Urinary Tract:  Urinary bladder unremarkable Bowel:  No abnormality observed Vascular/Lymphatic: Right external iliac node 1.0 cm in short axis on image 44 series 11. The right external iliac vein is somewhat narrowed by the adjacent ovary. Reproductive: 2 dominant low signal intensity uterine fibroids are identified, anterior fibroid 4.8 by 4.5 by 5.0 cm and the more posterior fibroid measuring 4.7 by 3.8 by 3.2 cm. This more posterior lower uterine segment fibroid has a submucosal component. The endometrium does not appear thickened. There is low T2 signal intensity throughout the myometrium and adenomyosis is not excluded. The right ovary measures about 5.0 by 2.4 by 4.0 cm, and appears to contain a 2.3 by 2.0 by 2.5 cm anterior cystic lesion with only marginal enhancement. This lesion has mixed precontrast T2 signal but primarily high, and is likely a mildly complex cyst. The left ovary measures approximately 3.6 by 1.8 by 2.7 cm. In the cul-de-sac region and abutting both ovaries, we demonstrate a 11.2 by 8.8 by 9.7 cm cystic lesion with an internal fluid-fluid level which could reflect hemorrhagic or infected contents. This only has fairly thin marginal enhancement which would seemingly be unusual for infected lesion. There is some enhancing nodularity along the left anterior  margin on image 39 of series 11 such that a small soft tissue nodule in this lesion is not excluded. Other: On T2 weighted images, there appears to be some retroperitoneal edema extending along the pelvic sidewalls, and also edema in the presacral space, as  shown for example on series 6. Musculoskeletal: No bony abnormality is observed. IMPRESSION: 1. Large cystic lesion in the cul-de-sac has a fluid-fluid level and only mild marginal enhancement although does have some nodular enhancing components along the left anterior margin and to a lesser extent along the right anterior margin adjacent to the ovaries. Given that this is new compared to 02/01/2021, I am skeptical that it represents an ovarian malignancy. More likely this is some form of hemorrhagic cystic lesion or less likely abscess in the cul-de-sac region. A could be arising from either ovary based on the abutment of both ovaries. There is edema tracking around both ovaries and in the pelvic sidewalls which is nonspecific, along with nonspecific presacral edema. Presumably this could be related to inflammation in the region, and if the has fever then the possibility of a large tubo-ovarian abscess extending in the cul-de-sac should be considered. 2. Mildly complex cystic lesion but without internal nodular enhancement anteriorly in the right ovary, probably a benign complex cyst given the thin marginal enhancement. 3. Notable uterine fibroids including a lower uterine segment subserosal fibroid. 4. Borderline right external iliac adenopathy, nonspecific. Electronically Signed   By: Van Clines M.D.   On: 04/07/2021 18:26   CT ABDOMEN PELVIS W CONTRAST  Result Date: 04/06/2021 CLINICAL DATA:  Nonlocalized abdominal pain.  Fever. EXAM: CT ABDOMEN AND PELVIS WITH CONTRAST TECHNIQUE: Multidetector CT imaging of the abdomen and pelvis was performed using the standard protocol following bolus administration of intravenous contrast. CONTRAST:  58mL OMNIPAQUE IOHEXOL 300 MG/ML  SOLN COMPARISON:  CT pelvis 02/01/2021 FINDINGS: Lower chest: Trace right pleural effusion. Bibasilar atelectasis. Retrocrural lymphadenopathy (3:23). Hepatobiliary: No focal liver abnormality. The gallbladder is contracted. No  gallstones, gallbladder wall thickening, or pericholecystic fluid. No biliary dilatation. Pancreas: No focal lesion. Normal pancreatic contour. No surrounding inflammatory changes. No main pancreatic ductal dilatation. Spleen: Enlarged spleen measuring up to 15 cm. Adrenals/Urinary Tract: No adrenal nodule bilaterally. Bilateral kidneys enhance symmetrically. No hydronephrosis. No hydroureter. The urinary bladder is unremarkable. Stomach/Bowel: Stomach is within normal limits. No evidence of bowel wall thickening or dilatation. Appendix appears normal. Vascular/Lymphatic: No abdominal aorta or iliac aneurysm. Mild atherosclerotic plaque of the aorta and its branches. Retroperitoneal lymphadenopathy and fat stranding: As an example 1.2 cm aortocaval lymph node (3:51). Stranding along the common iliacs and external iliacs suggestive of possible lymphoproliferative findings within the pelvis. No definite pelvic sidewall lymphadenopathy. No inguinal lymphadenopathy Reproductive: T-shaped intrauterine vice within in the uterus. Its position is unclear and may be malpositioned. The uterus is irregular in contour with suggestion of uterine fibroids. Interval development of a 9 cm fluid density lesion that is likely arising from the uterus/cervix within the rectouterine sac and is inseparable from both the uterus/cervix as well as rectum. Bilateral adnexal regions are otherwise grossly unremarkable. Other: No intraperitoneal free fluid. No intraperitoneal free gas. No organized fluid collection. Musculoskeletal: No abdominal wall hernia or abnormality. No suspicious lytic or blastic osseous lesions. No acute displaced fracture. Multilevel degenerative changes of the spine. IMPRESSION: 1. Trace right pleural effusion. 2. Splenomegaly as well as retroperitoneal and retrocrural lymphadenopathy concerning for lymphoproliferative disorder/lymphoma. Recommend PET-CT further evaluation. 3. Interval development of a 9 cm fluid  density  lesion likely arising from the uterus/cervix. Lesion is found to be inseparable from both the uterus/cervix as well as rectum. Fibroid uterus is also noted. Recommend pelvic ultrasound further evaluation. 4. Unclear positioning of a T-shaped intrauterine device noted within the uterus. May be malpositioned/too low. Recommend pelvic ultrasound. 5.  Aortic Atherosclerosis (ICD10-I70.0). Electronically Signed   By: Iven Finn M.D.   On: 04/06/2021 20:12        Scheduled Meds:  busPIRone  5 mg Oral BID   Continuous Infusions:  piperacillin-tazobactam (ZOSYN)  IV 3.375 g (04/08/21 0458)     LOS: 1 day    Time spent:40 min    Jaxxon Naeem, Geraldo Docker, MD [facility]   If 7PM-7AM, please contact night-coverage 04/08/2021, 8:11 AM

## 2021-04-09 ENCOUNTER — Inpatient Hospital Stay (HOSPITAL_COMMUNITY): Payer: PRIVATE HEALTH INSURANCE

## 2021-04-09 DIAGNOSIS — R1900 Intra-abdominal and pelvic swelling, mass and lump, unspecified site: Secondary | ICD-10-CM | POA: Diagnosis not present

## 2021-04-09 DIAGNOSIS — R509 Fever, unspecified: Secondary | ICD-10-CM | POA: Diagnosis not present

## 2021-04-09 DIAGNOSIS — N939 Abnormal uterine and vaginal bleeding, unspecified: Secondary | ICD-10-CM | POA: Diagnosis not present

## 2021-04-09 LAB — CBC WITH DIFFERENTIAL/PLATELET
Abs Immature Granulocytes: 0 10*3/uL (ref 0.00–0.07)
Band Neutrophils: 4 %
Basophils Absolute: 0 10*3/uL (ref 0.0–0.1)
Basophils Relative: 0 %
Eosinophils Absolute: 0 10*3/uL (ref 0.0–0.5)
Eosinophils Relative: 0 %
HCT: 26.1 % — ABNORMAL LOW (ref 36.0–46.0)
Hemoglobin: 8.3 g/dL — ABNORMAL LOW (ref 12.0–15.0)
Lymphocytes Relative: 18 %
Lymphs Abs: 1.5 10*3/uL (ref 0.7–4.0)
MCH: 29.1 pg (ref 26.0–34.0)
MCHC: 31.8 g/dL (ref 30.0–36.0)
MCV: 91.6 fL (ref 80.0–100.0)
Monocytes Absolute: 0.4 10*3/uL (ref 0.1–1.0)
Monocytes Relative: 5 %
Neutro Abs: 6.2 10*3/uL (ref 1.7–7.7)
Neutrophils Relative %: 73 %
Platelets: 258 10*3/uL (ref 150–400)
RBC: 2.85 MIL/uL — ABNORMAL LOW (ref 3.87–5.11)
RDW: 19.8 % — ABNORMAL HIGH (ref 11.5–15.5)
WBC: 8.1 10*3/uL (ref 4.0–10.5)
nRBC: 0 % (ref 0.0–0.2)
nRBC: 0 /100 WBC

## 2021-04-09 LAB — COMPREHENSIVE METABOLIC PANEL
ALT: 32 U/L (ref 0–44)
AST: 25 U/L (ref 15–41)
Albumin: 2.3 g/dL — ABNORMAL LOW (ref 3.5–5.0)
Alkaline Phosphatase: 107 U/L (ref 38–126)
Anion gap: 7 (ref 5–15)
BUN: <5 mg/dL — ABNORMAL LOW (ref 6–20)
CO2: 26 mmol/L (ref 22–32)
Calcium: 8.4 mg/dL — ABNORMAL LOW (ref 8.9–10.3)
Chloride: 99 mmol/L (ref 98–111)
Creatinine, Ser: 0.67 mg/dL (ref 0.44–1.00)
GFR, Estimated: >60 mL/min (ref >60)
Glucose, Bld: 99 mg/dL (ref 70–99)
Potassium: 3.9 mmol/L (ref 3.5–5.1)
Sodium: 132 mmol/L — ABNORMAL LOW (ref 135–145)
Total Bilirubin: 0.4 mg/dL (ref 0.3–1.2)
Total Protein: 7.3 g/dL (ref 6.5–8.1)

## 2021-04-09 LAB — MAGNESIUM: Magnesium: 2.3 mg/dL (ref 1.7–2.4)

## 2021-04-09 LAB — INHIBIN B: Inhibin B: 14.5 pg/mL

## 2021-04-09 LAB — PATHOLOGIST SMEAR REVIEW

## 2021-04-09 LAB — PROCALCITONIN: Procalcitonin: 0.17 ng/mL

## 2021-04-09 LAB — PHOSPHORUS: Phosphorus: 4 mg/dL (ref 2.5–4.6)

## 2021-04-09 MED ORDER — DOXYCYCLINE HYCLATE 100 MG PO TABS
100.0000 mg | ORAL_TABLET | Freq: Two times a day (BID) | ORAL | Status: DC
  Administered 2021-04-09 – 2021-04-10 (×3): 100 mg via ORAL
  Filled 2021-04-09 (×3): qty 1

## 2021-04-09 MED ORDER — IOHEXOL 300 MG/ML  SOLN
75.0000 mL | Freq: Once | INTRAMUSCULAR | Status: AC | PRN
  Administered 2021-04-09: 75 mL via INTRAVENOUS

## 2021-04-09 MED ORDER — POLYETHYLENE GLYCOL 3350 17 G PO PACK
17.0000 g | PACK | Freq: Two times a day (BID) | ORAL | Status: DC
  Administered 2021-04-09 – 2021-04-11 (×5): 17 g via ORAL
  Filled 2021-04-09 (×5): qty 1

## 2021-04-09 MED ORDER — SENNOSIDES-DOCUSATE SODIUM 8.6-50 MG PO TABS
1.0000 | ORAL_TABLET | Freq: Two times a day (BID) | ORAL | Status: DC
  Administered 2021-04-09 – 2021-04-11 (×5): 1 via ORAL
  Filled 2021-04-09 (×5): qty 1

## 2021-04-09 NOTE — Progress Notes (Signed)
Gynecology Progress Note  Admission Date: 04/06/2021 Current Date: 04/09/2021 1:18 PM  Caitlin Maddox is a 40 y.o. G3P3000 HD#3 admitted for fever, abdominal pain and pelvic mass.   History complicated by: Patient Active Problem List   Diagnosis Date Noted   Hypoalbuminemia due to protein-calorie malnutrition ([place])    Fever in adult    Adenopathy    Mass of uterine cervix 04/06/2021   Anemia due to blood loss, chronic 04/06/2021   Hyponatremia 04/06/2021   Splenomegaly 04/06/2021   Pelvic mass 04/06/2021   Cellulitis 02/02/2021   Cellulitis and abscess of buttock 02/01/2021   Abnormal uterine bleeding 02/01/2021   Symptomatic anemia 10/04/2020    ROS and patient/family/surgical history, located on admission H&P note dated 04/06/2021, have been reviewed, and there are no changes except as noted below Yesterday/Overnight Events:  none  Subjective:  Pt seen, doing well.  Discussed current plan.  Review of chart shows patient has been afebrile for at least 24 hours.  She denies any new fever/chills.  Pt still reports back pain which she attributes to the pelvic mass.  Pt notes she has started her menses.   Objective:   Vitals:   04/08/21 0905 04/08/21 2053 04/09/21 0445 04/09/21 0949  BP: 101/74 107/68 100/65 102/67  Pulse: 80 82 76 78  Resp: 18 18 18 18   Temp: 98.3 F (36.8 C) 99.2 F (37.3 C) 98.4 F (36.9 C) 98.2 F (36.8 C)  TempSrc: Oral Oral Oral Oral  SpO2: 100% 98% 97% 98%  Weight:      Height:        Temp:  [98.2 F (36.8 C)-99.2 F (37.3 C)] 98.2 F (36.8 C) (11/30 0949) Pulse Rate:  [76-82] 78 (11/30 0949) Resp:  [18] 18 (11/30 0949) BP: (100-107)/(65-68) 102/67 (11/30 0949) SpO2:  [97 %-98 %] 98 % (11/30 0949) I/O last 3 completed shifts: In: 202.9 [IV Piggyback:202.9] Out: -  Total I/O In: 200 [P.O.:200] Out: -   Intake/Output Summary (Last 24 hours) at 04/09/2021 1318 Last data filed at 04/09/2021 0900 Gross per 24 hour  Intake 328.77 ml   Output --  Net 328.77 ml     Current Vital Signs 24h Vital Sign Ranges  T 98.2 F (36.8 C) Temp  Avg: 98.6 F (37 C)  Min: 98.2 F (36.8 C)  Max: 99.2 F (37.3 C)  BP 102/67 BP  Min: 100/65  Max: 107/68  HR 78 Pulse  Avg: 78.7  Min: 76  Max: 82  RR 18 Resp  Avg: 18  Min: 18  Max: 18  SaO2 98 % Room Air SpO2  Avg: 97.7 %  Min: 97 %  Max: 98 %       24 Hour I/O Current Shift I/O  Time Ins Outs 11/29 0701 - 11/30 0700 In: 128.8  Out: -  11/30 0701 - 11/30 1900 In: 200 [P.O.:200] Out: -    Patient Vitals for the past 12 hrs:  BP Temp Temp src Pulse Resp SpO2  04/09/21 0949 102/67 98.2 F (36.8 C) Oral 78 18 98 %  04/09/21 0445 100/65 98.4 F (36.9 C) Oral 76 18 97 %     Patient Vitals for the past 24 hrs:  BP Temp Temp src Pulse Resp SpO2  04/09/21 0949 102/67 98.2 F (36.8 C) Oral 78 18 98 %  04/09/21 0445 100/65 98.4 F (36.9 C) Oral 76 18 97 %  04/08/21 2053 107/68 99.2 F (37.3 C) Oral 82 18 98 %  Physical exam: General appearance: alert, cooperative, appears stated age, and no distress Abdomen:  soft nondistended, mild lower abdominal tenderness GU: No gross VB, pap smear taken without incident Lungs: clear to auscultation bilaterally Heart: regular rate and rhythm Extremities: no lower extremity edema Skin: WNL Psych: appropriate Neurologic: Grossly normal  Medications Current Facility-Administered Medications  Medication Dose Route Frequency Provider Last Rate Last Admin   acetaminophen (TYLENOL) tablet 650 mg  650 mg Oral Q6H PRN Lenore Cordia, MD   650 mg at 04/07/21 2123   Or   acetaminophen (TYLENOL) suppository 650 mg  650 mg Rectal Q6H PRN Lenore Cordia, MD       alum & mag hydroxide-simeth (MAALOX/MYLANTA) 200-200-20 MG/5ML suspension 30 mL  30 mL Oral Q6H PRN Allie Bossier, MD   30 mL at 04/07/21 2002   busPIRone (BUSPAR) tablet 5 mg  5 mg Oral BID Zada Finders R, MD   5 mg at 04/09/21 0827   feeding supplement (ENSURE ENLIVE /  ENSURE PLUS) liquid 237 mL  237 mL Oral BID BM Cross, Melissa D, NP   237 mL at 04/09/21 0827   HYDROcodone-acetaminophen (NORCO/VICODIN) 5-325 MG per tablet 1-2 tablet  1-2 tablet Oral Q4H PRN Lenore Cordia, MD   2 tablet at 04/09/21 1206   ondansetron (ZOFRAN) tablet 4 mg  4 mg Oral Q6H PRN Lenore Cordia, MD   4 mg at 04/07/21 1153   Or   ondansetron (ZOFRAN) injection 4 mg  4 mg Intravenous Q6H PRN Lenore Cordia, MD       piperacillin-tazobactam (ZOSYN) IVPB 3.375 g  3.375 g Intravenous Q8H Hayden Rasmussen, MD 12.5 mL/hr at 04/09/21 1207 3.375 g at 04/09/21 1207   polyethylene glycol (MIRALAX / GLYCOLAX) packet 17 g  17 g Oral BID Regalado, Belkys A, MD   17 g at 04/09/21 1219   senna-docusate (Senokot-S) tablet 1 tablet  1 tablet Oral BID Regalado, Belkys A, MD   1 tablet at 04/09/21 1219      Labs  Recent Labs  Lab 04/07/21 0409 04/08/21 0122 04/09/21 0117  WBC 10.5 6.4 8.1  HGB 8.5* 7.8* 8.3*  HCT 26.6* 24.6* 26.1*  PLT 200 220 258    Recent Labs  Lab 04/07/21 0409 04/08/21 0122 04/09/21 0117  NA 133* 133* 132*  K 4.0 3.8 3.9  CL 99 99 99  CO2 28 27 26   BUN <5* <5* <5*  CREATININE 0.69 0.67 0.67  CALCIUM 8.2* 8.2* 8.4*  PROT 7.1 7.0 7.3  BILITOT 0.4 0.2* 0.4  ALKPHOS 125 98 107  ALT 44 35 32  AST 24 24 25   GLUCOSE 94 97 99    Radiology Chest CT pending  Assessment & Plan:  Pelvic pain, pelvic mass, fevers Pt discussed with Dr.Tucker from Gyn Oncology to verify the plan.  She will see her in 1-2 weeks to discuss possible surgery on the pelvic mass. Hospitalist aware of need to transition to oral antibiotics for outpatient therapy to cover PID and possibly pneumonia pending the chest CT.  Endometrial biopsy and pap have been acquired and sent to path. If menstrual bleeding is prolonged, pt can be seen as an outpatient at the Center for [facility].  Po megace can be used to control the bleeding if it becomes too heavy or prolonged. Code Status:  Full Code  Lynnda Shields, MD Attending Center for [facility] [place] [facility])

## 2021-04-09 NOTE — Progress Notes (Addendum)
PROGRESS NOTE    Caitlin Maddox  YDX:[id number] DOB: March 13, 1981 DOA: 04/06/2021 PCP: Kerin Perna, NP   Brief Narrative: 40 year old with past medical history significant for fibroid uterus with dysfunctional uterine bleeding, anemia due to chronic blood loss, GAD who was recently found to have a large complex cystic mass in the left adnexa concerning for ovarian neoplasm by pelvic ultrasound on 04/02/2021 with sitting in the ED.  Patient had appointment with [facility] gynecology however given return of abdominal pain she presented to the ED again for further evaluation.  Patient reports 1 week history of suprapubic lower abdominal pain.  She also report subjective fevers chills and diaphoresis.  She report chronic heavy menses last.  Chest x-ray minimal left lower lobe patchy opacity.  CT abdomen and pelvis with contrast showed trace right pleural effusion with bibasilar atelectasis.  Splenomegaly with retroperitoneal and retrocrural lymphadenopathy concerning for lymphoproliferative disorder/lymphoma.  Interval development of 9 cm fluid density lesion arising from the uterus service and found to be inseparable from both the uterine cervix as well as the rectum was noted.  IUD felt to be malpositioned.  Gynecology and oncology has been following.  Assessment & Plan:   Principal Problem:   Mass of uterine cervix Active Problems:   Abnormal uterine bleeding   Anemia due to blood loss, chronic   Hyponatremia   Splenomegaly   Pelvic mass   Hypoalbuminemia due to protein-calorie malnutrition (HCC)   Fever in adult   Adenopathy  1-Mass of uterus cervix -Patient underwent endometrial biopsy and Pap smear 11/30 -Alpha-fetoprotein less than 1.8, CEA1.6, CA 125: 25. Within normal range.  -MRI : Large cystic lesion in the cul-de-sac has fluid level and only mild marginal enhancement noted.  Some nodular enhancing component along the left anterior margin and to lesser extent along the right  anterior margin adjacent to the ovaries.  This could represent hemorrhagic cystic lesion or less likely abscess in the cul-de-sac region. Evaluated by gynecology oncology: In regards to pelvic mass this could be related to hemorrhagic Cyst or infection.  There is some nodularity along the wall of the cyst, raising the concern for possible malignancy.  Oncology has some concern that the mass could be infected, presenting almost like PID picture.  Oncology recommended avoiding percutaneous drainage as possible due to increased risk of peritoneal seeding if this was malignancy.  Recommend to treat with IV antibiotics and subsequently changed to oral. -Discussed with Dr. Elgie Congo, plan to transition to oral antibiotic like doxycycline to cover PID for a total of 14 days at discharge.  And antibiotics to cover for pneumonia. -Currently on IV zosyn  SIRS; : Treating for possible PID: On IV zosyn Blood culture no growth to date.   Splenomegaly with retroperitoneal and retrocrural lymphadenopathy CT abdomen and pelvis concerning for lymphoproliferative disorder/lymphoma She will need PET scan. She will need future follow-up Pathology smear: Normocytic anemia. Plan To check CT chest today.  Hyponatremia: Continue with IV fluids  Anemia Due to chronic blood loss in the setting of fibroid uterus: Hemoglobin is stable -Underwent endometrial biopsy and Pap test by gynecology 11/30  Malpositioned IUD: Removed by [facility] gynecology  Generalized anxiety disorder continue with BuSpar   Estimated body mass index is 25.77 kg/m as calculated from the following:   Height as of this encounter: 6' (1.829 m).   Weight as of this encounter: 86.2 kg.   DVT prophylaxis: SCDs Code Status: Full codeFull Code Family Communication: Care discussed with patient care discussed with  patient Disposition Plan:  Status is: Inpatient  Remains inpatient appropriate because: Patient admitted with        Consultants:   GYN GYN Oncology  Procedures:    Antimicrobials:    Subjective: She reports abdominal pain, Vicodin is helping.  No bowel movement last 2 days.  Objective: Vitals:   04/08/21 2053 04/09/21 0445 04/09/21 0949 04/09/21 1622  BP: 107/68 100/65 102/67 113/69  Pulse: 82 76 78 87  Resp: 18 18 18 18   Temp: 99.2 F (37.3 C) 98.4 F (36.9 C) 98.2 F (36.8 C) 98.6 F (37 C)  TempSrc: Oral Oral Oral Oral  SpO2: 98% 97% 98% 100%  Weight:      Height:        Intake/Output Summary (Last 24 hours) at 04/09/2021 1728 Last data filed at 04/09/2021 1507 Gross per 24 hour  Intake 344.85 ml  Output --  Net 344.85 ml   Filed Weights   04/06/21 1708  Weight: 86.2 kg    Examination:  General exam: Appears calm and comfortable  Respiratory system: Clear to auscultation. Respiratory effort normal. Cardiovascular system: S1 & S2 heard, RRR. No JVD, murmurs, rubs, gallops or clicks. No pedal edema. Gastrointestinal system: Abdomen is nondistended, soft and tender to palpation  Central nervous system: Alert and oriented.  Extremities: Symmetric 5 x 5 power.   Data Reviewed: I have personally reviewed following labs and imaging studies  CBC: Recent Labs  Lab 04/06/21 1710 04/07/21 0409 04/08/21 0122 04/09/21 0117  WBC 12.6* 10.5 6.4 8.1  NEUTROABS 9.8* 8.1* 4.0 6.2  HGB 9.1* 8.5* 7.8* 8.3*  HCT 28.0* 26.6* 24.6* 26.1*  MCV 90.0 89.9 91.4 91.6  PLT 285 200 220 287   Basic Metabolic Panel: Recent Labs  Lab 04/06/21 1710 04/07/21 0409 04/08/21 0122 04/09/21 0117  NA 128* 133* 133* 132*  K 3.8 4.0 3.8 3.9  CL 90* 99 99 99  CO2 29 28 27 26   GLUCOSE 113* 94 97 99  BUN 5* <5* <5* <5*  CREATININE 0.70 0.69 0.67 0.67  CALCIUM 8.6* 8.2* 8.2* 8.4*  MG  --   --  2.1 2.3  PHOS  --   --  3.2 4.0   GFR: Estimated Creatinine Clearance: 109 mL/min (by C-G formula based on SCr of 0.67 mg/dL). Liver Function Tests: Recent Labs  Lab 04/06/21 1710 04/07/21 0409  04/08/21 0122 04/09/21 0117  AST 35 24 24 25   ALT 59* 44 35 32  ALKPHOS 146* 125 98 107  BILITOT 0.5 0.4 0.2* 0.4  PROT 8.1 7.1 7.0 7.3  ALBUMIN 2.7* 2.4* 2.3* 2.3*   Recent Labs  Lab 04/06/21 1710  LIPASE 34   No results for input(s): AMMONIA in the last 168 hours. Coagulation Profile: No results for input(s): INR, PROTIME in the last 168 hours. Cardiac Enzymes: No results for input(s): CKTOTAL, CKMB, CKMBINDEX, TROPONINI in the last 168 hours. BNP (last 3 results) No results for input(s): PROBNP in the last 8760 hours. HbA1C: No results for input(s): HGBA1C in the last 72 hours. CBG: No results for input(s): GLUCAP in the last 168 hours. Lipid Profile: No results for input(s): CHOL, HDL, LDLCALC, TRIG, CHOLHDL, LDLDIRECT in the last 72 hours. Thyroid Function Tests: No results for input(s): TSH, T4TOTAL, FREET4, T3FREE, THYROIDAB in the last 72 hours. Anemia Panel: No results for input(s): VITAMINB12, FOLATE, FERRITIN, TIBC, IRON, RETICCTPCT in the last 72 hours. Sepsis Labs: Recent Labs  Lab 04/07/21 0836 04/07/21 1147 04/08/21 0122 04/08/21 6811 04/08/21  1105 04/09/21 0117  PROCALCITON 0.34  --  0.51 0.20  --  0.17  LATICACIDVEN 0.8 1.1  --  0.8 1.2  --     Recent Results (from the past 240 hour(s))  Wet prep, genital     Status: Abnormal   Collection Time: 04/02/21  7:51 PM   Specimen: PATH Cytology Cervicovaginal Ancillary Only  Result Value Ref Range Status   Yeast Wet Prep HPF POC NONE SEEN NONE SEEN Final   Trich, Wet Prep NONE SEEN NONE SEEN Final   Clue Cells Wet Prep HPF POC PRESENT (A) NONE SEEN Final   WBC, Wet Prep HPF POC <10 <10 Final    Comment: Please note change in reference range.   Sperm NONE SEEN  Final    Comment: Performed at [facility], [place] [address] [place], [place] [address]  Blood culture (routine x 2)     Status: None (Preliminary result)   Collection Time: 04/06/21  4:54 PM   Specimen: BLOOD  Result Value Ref Range  Status   Specimen Description BLOOD RIGHT ANTECUBITAL  Final   Special Requests   Final    BOTTLES DRAWN AEROBIC AND ANAEROBIC Blood Culture adequate volume   Culture   Final    NO GROWTH 3 DAYS Performed at [facility], [place] [address] [place],  [address]    Report Status PENDING  Incomplete  Resp Panel by RT-PCR (Flu A&B, Covid) Nasopharyngeal Swab     Status: None   Collection Time: 04/06/21  5:57 PM   Specimen: Nasopharyngeal Swab; Nasopharyngeal(NP) swabs in vial transport medium  Result Value Ref Range Status   SARS Coronavirus 2 by RT PCR NEGATIVE NEGATIVE Final    Comment: (NOTE) SARS-CoV-2 target nucleic acids are NOT DETECTED.  The SARS-CoV-2 RNA is generally detectable in upper respiratory specimens during the acute phase of infection. The lowest concentration of SARS-CoV-2 viral copies this assay can detect is 138 copies/mL. A negative result does not preclude SARS-Cov-2 infection and should not be used as the sole basis for treatment or other patient management decisions. A negative result may occur with  improper specimen collection/handling, submission of specimen other than nasopharyngeal swab, presence of viral mutation(s) within the areas targeted by this assay, and inadequate number of viral copies(<138 copies/mL). A negative result must be combined with clinical observations, patient history, and epidemiological information. The expected result is Negative.  Fact Sheet for Patients:  EntrepreneurPulse.com.au  Fact Sheet for Healthcare Providers:  IncredibleEmployment.be  This test is no t yet approved or cleared by the [place] FDA and  has been authorized for detection and/or diagnosis of SARS-CoV-2 by FDA under an Emergency Use Authorization (EUA). This EUA will remain  in effect (meaning this test can be used) for the duration of the COVID-19 declaration under Section 564(b)(1) of the Act,  21 U.S.C.section 360bbb-3(b)(1), unless the authorization is terminated  or revoked sooner.       Influenza A by PCR NEGATIVE NEGATIVE Final   Influenza B by PCR NEGATIVE NEGATIVE Final    Comment: (NOTE) The Xpert Xpress SARS-CoV-2/FLU/RSV plus assay is intended as an aid in the diagnosis of influenza from Nasopharyngeal swab specimens and should not be used as a sole basis for treatment. Nasal washings and aspirates are unacceptable for Xpert Xpress SARS-CoV-2/FLU/RSV testing.  Fact Sheet for Patients: EntrepreneurPulse.com.au  Fact Sheet for Healthcare Providers: IncredibleEmployment.be  This test is not yet approved or cleared by the [place] FDA  and has been authorized for detection and/or diagnosis of SARS-CoV-2 by FDA under an Emergency Use Authorization (EUA). This EUA will remain in effect (meaning this test can be used) for the duration of the COVID-19 declaration under Section 564(b)(1) of the Act, 21 U.S.C. section 360bbb-3(b)(1), unless the authorization is terminated or revoked.  Performed at [facility], [place] [address], [place], [place] [address]   Blood culture (routine x 2)     Status: None (Preliminary result)   Collection Time: 04/06/21  6:58 PM   Specimen: BLOOD  Result Value Ref Range Status   Specimen Description BLOOD RIGHT ANTECUBITAL  Final   Special Requests   Final    BOTTLES DRAWN AEROBIC AND ANAEROBIC Blood Culture results may not be optimal due to an inadequate volume of blood received in culture bottles   Culture   Final    NO GROWTH 3 DAYS Performed at [facility], [place] [address] [place], [place] [address]    Report Status PENDING  Incomplete         Radiology Studies: MR PELVIS W WO CONTRAST  Result Date: 04/07/2021 CLINICAL DATA:  Pelvic/adnexal masses EXAM: MRI PELVIS WITHOUT AND WITH CONTRAST TECHNIQUE: Multiplanar multisequence MR imaging of the pelvis was performed both  before and after administration of intravenous contrast. CONTRAST:  8.40mL GADAVIST GADOBUTROL 1 MMOL/ML IV SOLN COMPARISON:  CT scan 04/06/2021.  Pelvic ultrasound 04/02/2021 FINDINGS: Urinary Tract:  Urinary bladder unremarkable Bowel:  No abnormality observed Vascular/Lymphatic: Right external iliac node 1.0 cm in short axis on image 44 series 11. The right external iliac vein is somewhat narrowed by the adjacent ovary. Reproductive: 2 dominant low signal intensity uterine fibroids are identified, anterior fibroid 4.8 by 4.5 by 5.0 cm and the more posterior fibroid measuring 4.7 by 3.8 by 3.2 cm. This more posterior lower uterine segment fibroid has a submucosal component. The endometrium does not appear thickened. There is low T2 signal intensity throughout the myometrium and adenomyosis is not excluded. The right ovary measures about 5.0 by 2.4 by 4.0 cm, and appears to contain a 2.3 by 2.0 by 2.5 cm anterior cystic lesion with only marginal enhancement. This lesion has mixed precontrast T2 signal but primarily high, and is likely a mildly complex cyst. The left ovary measures approximately 3.6 by 1.8 by 2.7 cm. In the cul-de-sac region and abutting both ovaries, we demonstrate a 11.2 by 8.8 by 9.7 cm cystic lesion with an internal fluid-fluid level which could reflect hemorrhagic or infected contents. This only has fairly thin marginal enhancement which would seemingly be unusual for infected lesion. There is some enhancing nodularity along the left anterior margin on image 39 of series 11 such that a small soft tissue nodule in this lesion is not excluded. Other: On T2 weighted images, there appears to be some retroperitoneal edema extending along the pelvic sidewalls, and also edema in the presacral space, as shown for example on series 6. Musculoskeletal: No bony abnormality is observed. IMPRESSION: 1. Large cystic lesion in the cul-de-sac has a fluid-fluid level and only mild marginal enhancement although  does have some nodular enhancing components along the left anterior margin and to a lesser extent along the right anterior margin adjacent to the ovaries. Given that this is new compared to 02/01/2021, I am skeptical that it represents an ovarian malignancy. More likely this is some form of hemorrhagic cystic lesion or less likely abscess in the cul-de-sac region. A could be arising from either ovary  based on the abutment of both ovaries. There is edema tracking around both ovaries and in the pelvic sidewalls which is nonspecific, along with nonspecific presacral edema. Presumably this could be related to inflammation in the region, and if the has fever then the possibility of a large tubo-ovarian abscess extending in the cul-de-sac should be considered. 2. Mildly complex cystic lesion but without internal nodular enhancement anteriorly in the right ovary, probably a benign complex cyst given the thin marginal enhancement. 3. Notable uterine fibroids including a lower uterine segment subserosal fibroid. 4. Borderline right external iliac adenopathy, nonspecific. Electronically Signed   By: Van Clines M.D.   On: 04/07/2021 18:26        Scheduled Meds:  busPIRone  5 mg Oral BID   doxycycline  100 mg Oral Q12H   feeding supplement  237 mL Oral BID BM   polyethylene glycol  17 g Oral BID   senna-docusate  1 tablet Oral BID   Continuous Infusions:  piperacillin-tazobactam (ZOSYN)  IV 12.5 mL/hr at 04/09/21 1507     LOS: 2 days    Time spent: 35 minutes    Coco Sharpnack A Makaio Mach, MD [facility]   If 7PM-7AM, please contact night-coverage [email or web address]  04/09/2021, 5:28 PM

## 2021-04-09 NOTE — Procedures (Signed)
ENDOMETRIAL BIOPSY      Caitlin Maddox is a 40 y.o. G3P3000 seen as an inpatient for endometrial biopsy.  The indications for endometrial biopsy were reviewed.  Risks of the biopsy including cramping, bleeding, infection, uterine perforation, inadequate specimen and need for additional procedures were discussed. The patient states she understands and agrees to undergo procedure today. Consent was signed. Time out was performed.   Indications: pelvic mass, possible endometritis, fever Urine HCG: previously negative, pt on menses  A bivalve speculum was placed into the vagina and the cervix was easily visualized and was prepped with Betadine x2.  The 3 mm pipelle was introduced into the endometrial cavity without difficulty to a depth of 10 cm, and a moderate amount of tissue was obtained and sent to pathology. This was repeated for a total of 3 passes. The instruments were removed from the patient's vagina. The patient tolerated the procedure well. Routine post-procedure instructions were given to the patient.    Will base further management on results of biopsy.  Lynnda Shields, MD

## 2021-04-09 NOTE — Progress Notes (Signed)
Mobility Specialist Progress Note:   04/09/21 1400  Mobility  Activity Ambulated in hall  Level of Assistance Independent  Assistive Device None  Distance Ambulated (ft) 570 ft  Mobility Ambulated independently in hallway  Mobility Response Tolerated well  Mobility performed by Mobility specialist  $Mobility charge 1 Mobility   Pt c/o "major cramping" after biopsy today. Px worsens with long distances. Otherwise asx during ambulation.  Nelta Numbers Mobility Specialist  Phone [phone]

## 2021-04-10 ENCOUNTER — Inpatient Hospital Stay: Payer: Self-pay

## 2021-04-10 DIAGNOSIS — R599 Enlarged lymph nodes, unspecified: Secondary | ICD-10-CM | POA: Diagnosis not present

## 2021-04-10 DIAGNOSIS — N888 Other specified noninflammatory disorders of cervix uteri: Secondary | ICD-10-CM | POA: Diagnosis not present

## 2021-04-10 DIAGNOSIS — N939 Abnormal uterine and vaginal bleeding, unspecified: Secondary | ICD-10-CM | POA: Diagnosis not present

## 2021-04-10 DIAGNOSIS — R1900 Intra-abdominal and pelvic swelling, mass and lump, unspecified site: Secondary | ICD-10-CM | POA: Diagnosis not present

## 2021-04-10 DIAGNOSIS — R634 Abnormal weight loss: Secondary | ICD-10-CM

## 2021-04-10 DIAGNOSIS — E8809 Other disorders of plasma-protein metabolism, not elsewhere classified: Secondary | ICD-10-CM | POA: Diagnosis not present

## 2021-04-10 LAB — COMPREHENSIVE METABOLIC PANEL
ALT: 31 U/L (ref 0–44)
AST: 33 U/L (ref 15–41)
Albumin: 2.4 g/dL — ABNORMAL LOW (ref 3.5–5.0)
Alkaline Phosphatase: 101 U/L (ref 38–126)
Anion gap: 9 (ref 5–15)
BUN: <5 mg/dL — ABNORMAL LOW (ref 6–20)
CO2: 25 mmol/L (ref 22–32)
Calcium: 8.4 mg/dL — ABNORMAL LOW (ref 8.9–10.3)
Chloride: 99 mmol/L (ref 98–111)
Creatinine, Ser: 0.68 mg/dL (ref 0.44–1.00)
GFR, Estimated: >60 mL/min (ref >60)
Glucose, Bld: 105 mg/dL — ABNORMAL HIGH (ref 70–99)
Potassium: 4.1 mmol/L (ref 3.5–5.1)
Sodium: 133 mmol/L — ABNORMAL LOW (ref 135–145)
Total Bilirubin: 0.4 mg/dL (ref 0.3–1.2)
Total Protein: 7.5 g/dL (ref 6.5–8.1)

## 2021-04-10 LAB — CBC WITH DIFFERENTIAL/PLATELET
Abs Immature Granulocytes: 0.08 10*3/uL — ABNORMAL HIGH (ref 0.00–0.07)
Basophils Absolute: 0 10*3/uL (ref 0.0–0.1)
Basophils Relative: 0 %
Eosinophils Absolute: 0 10*3/uL (ref 0.0–0.5)
Eosinophils Relative: 0 %
HCT: 25.2 % — ABNORMAL LOW (ref 36.0–46.0)
Hemoglobin: 8.1 g/dL — ABNORMAL LOW (ref 12.0–15.0)
Immature Granulocytes: 1 %
Lymphocytes Relative: 18 %
Lymphs Abs: 1.8 10*3/uL (ref 0.7–4.0)
MCH: 29.1 pg (ref 26.0–34.0)
MCHC: 32.1 g/dL (ref 30.0–36.0)
MCV: 90.6 fL (ref 80.0–100.0)
Monocytes Absolute: 0.9 10*3/uL (ref 0.1–1.0)
Monocytes Relative: 9 %
Neutro Abs: 7.4 10*3/uL (ref 1.7–7.7)
Neutrophils Relative %: 72 %
Platelets: 267 10*3/uL (ref 150–400)
RBC: 2.78 MIL/uL — ABNORMAL LOW (ref 3.87–5.11)
RDW: 19.3 % — ABNORMAL HIGH (ref 11.5–15.5)
WBC: 10.2 10*3/uL (ref 4.0–10.5)
nRBC: 0.2 % (ref 0.0–0.2)

## 2021-04-10 LAB — PHOSPHORUS: Phosphorus: 4.2 mg/dL (ref 2.5–4.6)

## 2021-04-10 LAB — SURGICAL PATHOLOGY

## 2021-04-10 LAB — PROCALCITONIN: Procalcitonin: 0.11 ng/mL

## 2021-04-10 LAB — HIV ANTIBODY (ROUTINE TESTING W REFLEX): HIV Screen 4th Generation wRfx: NONREACTIVE

## 2021-04-10 LAB — LACTATE DEHYDROGENASE: LDH: 152 U/L (ref 98–192)

## 2021-04-10 LAB — MAGNESIUM: Magnesium: 2.2 mg/dL (ref 1.7–2.4)

## 2021-04-10 MED ORDER — CEFTRIAXONE SODIUM 2 G IJ SOLR
2.0000 g | INTRAMUSCULAR | Status: DC
  Administered 2021-04-10 – 2021-04-11 (×2): 2 g via INTRAVENOUS
  Filled 2021-04-10 (×2): qty 20

## 2021-04-10 MED ORDER — SODIUM CHLORIDE 0.9% FLUSH: 10.0000 mL | INTRAVENOUS | Status: DC | PRN

## 2021-04-10 MED ORDER — CHLORHEXIDINE GLUCONATE CLOTH 2 % EX PADS
6.0000 | MEDICATED_PAD | Freq: Every day | CUTANEOUS | Status: DC
  Administered 2021-04-10: 6 via TOPICAL

## 2021-04-10 MED ORDER — METRONIDAZOLE 500 MG PO TABS
500.0000 mg | ORAL_TABLET | Freq: Two times a day (BID) | ORAL | Status: DC
  Administered 2021-04-10 – 2021-04-11 (×3): 500 mg via ORAL
  Filled 2021-04-10 (×3): qty 1

## 2021-04-10 MED ORDER — CHLORHEXIDINE GLUCONATE CLOTH 2 % EX PADS: 6.0000 | MEDICATED_PAD | Freq: Every day | CUTANEOUS | Status: DC

## 2021-04-10 NOTE — Progress Notes (Signed)
PROGRESS NOTE    Caitlin Maddox  YYQ:[id number] DOB: [birth year] DOA: 04/06/2021 PCP: Kerin Perna, NP   Brief Narrative: 40 year old with past medical history significant for fibroid uterus with dysfunctional uterine bleeding, anemia due to chronic blood loss, GAD who was recently found to have a large complex cystic mass in the left adnexa concerning for ovarian neoplasm by pelvic ultrasound on 04/02/2021 with sitting in the ED.  Patient had appointment with [facility] gynecology however given return of abdominal pain she presented to the ED again for further evaluation.  Patient reports 1 week history of suprapubic lower abdominal pain.  She also report subjective fevers chills and diaphoresis.  She report chronic heavy menses last.  Chest x-ray minimal left lower lobe patchy opacity.  CT abdomen and pelvis with contrast showed trace right pleural effusion with bibasilar atelectasis.  Splenomegaly with retroperitoneal and retrocrural lymphadenopathy concerning for lymphoproliferative disorder/lymphoma.  Interval development of 9 cm fluid density lesion arising from the uterus service and found to be inseparable from both the uterine cervix as well as the rectum was noted.  IUD felt to be malpositioned.  Gynecology and oncology has been following.  Assessment & Plan:   Principal Problem:   Mass of uterine cervix Active Problems:   Abnormal uterine bleeding   Anemia due to blood loss, chronic   Hyponatremia   Splenomegaly   Pelvic mass   Hypoalbuminemia due to protein-calorie malnutrition (HCC)   Fever in adult   Adenopathy  1-Mass of uterus cervix -Patient underwent endometrial biopsy and Pap smear 11/30 -Alpha-fetoprotein less than 1.8, CEA1.6, CA 125: 25. Within normal range.  -MRI : Large cystic lesion in the cul-de-sac has fluid level and only mild marginal enhancement noted.  Some nodular enhancing component along the left anterior margin and to lesser extent along the right  anterior margin adjacent to the ovaries.  This could represent hemorrhagic cystic lesion or less likely abscess in the cul-de-sac region. Evaluated by gynecology oncology: In regards to pelvic mass this could be related to hemorrhagic Cyst or infection.  There is some nodularity along the wall of the cyst, raising the concern for possible malignancy.  Oncology has some concern that the mass could be infected, presenting almost like PID picture.  Oncology recommended avoiding percutaneous drainage as possible due to increased risk of peritoneal seeding if this was malignancy.  Recommend to treat with IV antibiotics and subsequently changed to oral. -Discussed with Dr. Elgie Congo, plan to transition to oral antibiotic like doxycycline to cover PID for a total of 14 days at discharge.  And antibiotics to cover for pneumonia. -ID consulted. Antibiotics change to IV ceftriaxone and oral flagyl. Recommend IV antibiotics at discharge. CM consulted, to help arrange.   SIRS; : Treating for possible PID: IV ceftriaxone and flagyl.  Blood culture no growth to date.   Splenomegaly with retroperitoneal and retrocrural lymphadenopathy CT abdomen and pelvis concerning for lymphoproliferative disorder/lymphoma She will need PET scan. She will need future follow-up Pathology smear: Normocytic anemia. Plan To check CT chest today.  Hyponatremia: Continue with IV fluids  Anemia Due to chronic blood loss in the setting of fibroid uterus: Hemoglobin is stable -Underwent endometrial biopsy and Pap test by gynecology 11/30  Malpositioned IUD: Removed by [facility] gynecology  Generalized anxiety disorder continue with BuSpar   Estimated body mass index is 25.77 kg/m as calculated from the following:   Height as of this encounter: 6' (1.829 m).   Weight as of this encounter: 86.2 kg.  DVT prophylaxis: SCDs Code Status: Full codeFull Code Family Communication: Care discussed with patient care discussed with  patient Disposition Plan:  Status is: Inpatient  Remains inpatient appropriate because: Patient admitted with        Consultants:  GYN GYN Oncology  Procedures:    Antimicrobials:    Subjective: Abdominal pain 6-7/10 No new complaints.   Objective: Vitals:   04/09/21 1622 04/09/21 2130 04/10/21 0407 04/10/21 0804  BP: 113/69 117/74 107/61 110/77  Pulse: 87 92 78 85  Resp: 18 18 18 18   Temp: 98.6 F (37 C) 99.7 F (37.6 C) 98.3 F (36.8 C) 98.1 F (36.7 C)  TempSrc: Oral Oral Oral Oral  SpO2: 100% 97% 99% 96%  Weight:      Height:        Intake/Output Summary (Last 24 hours) at 04/10/2021 1352 Last data filed at 04/10/2021 0650 Gross per 24 hour  Intake 559.38 ml  Output --  Net 559.38 ml    Filed Weights   04/06/21 1708  Weight: 86.2 kg    Examination:  General exam: NAD  Respiratory system: CTA Cardiovascular system: S 1, S 2 RRR Gastrointestinal system:BS present, soft, tender to palpation.  Central nervous system: alert Extremities: no edema   Data Reviewed: I have personally reviewed following labs and imaging studies  CBC: Recent Labs  Lab 04/06/21 1710 04/07/21 0409 04/08/21 0122 04/09/21 0117 04/10/21 0117  WBC 12.6* 10.5 6.4 8.1 10.2  NEUTROABS 9.8* 8.1* 4.0 6.2 7.4  HGB 9.1* 8.5* 7.8* 8.3* 8.1*  HCT 28.0* 26.6* 24.6* 26.1* 25.2*  MCV 90.0 89.9 91.4 91.6 90.6  PLT 285 200 220 258 093    Basic Metabolic Panel: Recent Labs  Lab 04/06/21 1710 04/07/21 0409 04/08/21 0122 04/09/21 0117 04/10/21 0117  NA 128* 133* 133* 132* 133*  K 3.8 4.0 3.8 3.9 4.1  CL 90* 99 99 99 99  CO2 29 28 27 26 25   GLUCOSE 113* 94 97 99 105*  BUN 5* <5* <5* <5* <5*  CREATININE 0.70 0.69 0.67 0.67 0.68  CALCIUM 8.6* 8.2* 8.2* 8.4* 8.4*  MG  --   --  2.1 2.3 2.2  PHOS  --   --  3.2 4.0 4.2    GFR: Estimated Creatinine Clearance: 109 mL/min (by C-G formula based on SCr of 0.68 mg/dL). Liver Function Tests: Recent Labs  Lab  04/06/21 1710 04/07/21 0409 04/08/21 0122 04/09/21 0117 04/10/21 0117  AST 35 24 24 25  33  ALT 59* 44 35 32 31  ALKPHOS 146* 125 98 107 101  BILITOT 0.5 0.4 0.2* 0.4 0.4  PROT 8.1 7.1 7.0 7.3 7.5  ALBUMIN 2.7* 2.4* 2.3* 2.3* 2.4*    Recent Labs  Lab 04/06/21 1710  LIPASE 34    No results for input(s): AMMONIA in the last 168 hours. Coagulation Profile: No results for input(s): INR, PROTIME in the last 168 hours. Cardiac Enzymes: No results for input(s): CKTOTAL, CKMB, CKMBINDEX, TROPONINI in the last 168 hours. BNP (last 3 results) No results for input(s): PROBNP in the last 8760 hours. HbA1C: No results for input(s): HGBA1C in the last 72 hours. CBG: No results for input(s): GLUCAP in the last 168 hours. Lipid Profile: No results for input(s): CHOL, HDL, LDLCALC, TRIG, CHOLHDL, LDLDIRECT in the last 72 hours. Thyroid Function Tests: No results for input(s): TSH, T4TOTAL, FREET4, T3FREE, THYROIDAB in the last 72 hours. Anemia Panel: No results for input(s): VITAMINB12, FOLATE, FERRITIN, TIBC, IRON, RETICCTPCT in the last 72  hours. Sepsis Labs: Recent Labs  Lab 04/07/21 0836 04/07/21 1147 04/08/21 0122 04/08/21 0832 04/08/21 1105 04/09/21 0117 04/10/21 0117  PROCALCITON 0.34  --  0.51 0.20  --  0.17 0.11  LATICACIDVEN 0.8 1.1  --  0.8 1.2  --   --      Recent Results (from the past 240 hour(s))  Wet prep, genital     Status: Abnormal   Collection Time: 04/02/21  7:51 PM   Specimen: PATH Cytology Cervicovaginal Ancillary Only  Result Value Ref Range Status   Yeast Wet Prep HPF POC NONE SEEN NONE SEEN Final   Trich, Wet Prep NONE SEEN NONE SEEN Final   Clue Cells Wet Prep HPF POC PRESENT (A) NONE SEEN Final   WBC, Wet Prep HPF POC <10 <10 Final    Comment: Please note change in reference range.   Sperm NONE SEEN  Final    Comment: Performed at [facility], [place] [address], [place], [place] [address]  Blood culture (routine x 2)     Status: None  (Preliminary result)   Collection Time: 04/06/21  4:54 PM   Specimen: BLOOD  Result Value Ref Range Status   Specimen Description BLOOD RIGHT ANTECUBITAL  Final   Special Requests   Final    BOTTLES DRAWN AEROBIC AND ANAEROBIC Blood Culture adequate volume   Culture   Final    NO GROWTH 4 DAYS Performed at [facility], [place] [address] [place], [place] [address]    Report Status PENDING  Incomplete  Resp Panel by RT-PCR (Flu A&B, Covid) Nasopharyngeal Swab     Status: None   Collection Time: 04/06/21  5:57 PM   Specimen: Nasopharyngeal Swab; Nasopharyngeal(NP) swabs in vial transport medium  Result Value Ref Range Status   SARS Coronavirus 2 by RT PCR NEGATIVE NEGATIVE Final    Comment: (NOTE) SARS-CoV-2 target nucleic acids are NOT DETECTED.  The SARS-CoV-2 RNA is generally detectable in upper respiratory specimens during the acute phase of infection. The lowest concentration of SARS-CoV-2 viral copies this assay can detect is 138 copies/mL. A negative result does not preclude SARS-Cov-2 infection and should not be used as the sole basis for treatment or other patient management decisions. A negative result may occur with  improper specimen collection/handling, submission of specimen other than nasopharyngeal swab, presence of viral mutation(s) within the areas targeted by this assay, and inadequate number of viral copies(<138 copies/mL). A negative result must be combined with clinical observations, patient history, and epidemiological information. The expected result is Negative.  Fact Sheet for Patients:  EntrepreneurPulse.com.au  Fact Sheet for Healthcare Providers:  IncredibleEmployment.be  This test is no t yet approved or cleared by the [place] FDA and  has been authorized for detection and/or diagnosis of SARS-CoV-2 by FDA under an Emergency Use Authorization (EUA). This EUA will remain  in effect (meaning this test can  be used) for the duration of the COVID-19 declaration under Section 564(b)(1) of the Act, 21 U.S.C.section 360bbb-3(b)(1), unless the authorization is terminated  or revoked sooner.       Influenza A by PCR NEGATIVE NEGATIVE Final   Influenza B by PCR NEGATIVE NEGATIVE Final    Comment: (NOTE) The Xpert Xpress SARS-CoV-2/FLU/RSV plus assay is intended as an aid in the diagnosis of influenza from Nasopharyngeal swab specimens and should not be used as a sole basis for treatment. Nasal washings and aspirates are unacceptable for Xpert Xpress SARS-CoV-2/FLU/RSV testing.  Fact Sheet for  Patients: EntrepreneurPulse.com.au  Fact Sheet for Healthcare Providers: IncredibleEmployment.be  This test is not yet approved or cleared by the [place] FDA and has been authorized for detection and/or diagnosis of SARS-CoV-2 by FDA under an Emergency Use Authorization (EUA). This EUA will remain in effect (meaning this test can be used) for the duration of the COVID-19 declaration under Section 564(b)(1) of the Act, 21 U.S.C. section 360bbb-3(b)(1), unless the authorization is terminated or revoked.  Performed at [facility], [place] [address], [place], [place] [address]   Blood culture (routine x 2)     Status: None (Preliminary result)   Collection Time: 04/06/21  6:58 PM   Specimen: BLOOD  Result Value Ref Range Status   Specimen Description BLOOD RIGHT ANTECUBITAL  Final   Special Requests   Final    BOTTLES DRAWN AEROBIC AND ANAEROBIC Blood Culture results may not be optimal due to an inadequate volume of blood received in culture bottles   Culture   Final    NO GROWTH 4 DAYS Performed at [facility], [place] [address], [place], [place] [address]    Report Status PENDING  Incomplete          Radiology Studies: CT CHEST W CONTRAST  Result Date: 04/09/2021 CLINICAL DATA:  Lymphadenopathy.  Abdominal pain and splenomegaly. EXAM:  CT CHEST WITH CONTRAST TECHNIQUE: Multidetector CT imaging of the chest was performed during intravenous contrast administration. CONTRAST:  93mL OMNIPAQUE IOHEXOL 300 MG/ML  SOLN COMPARISON:  MR pelvis 04/07/2021. CT abdomen and pelvis 04/06/2021. FINDINGS: Cardiovascular: No significant vascular findings. Normal heart size. No pericardial effusion. Mediastinum/Nodes: There are no enlarged mediastinal, hilar or axillary lymph nodes. Nonenlarged bilateral axillary lymph nodes are present. The visualized thyroid gland and esophagus are within normal limits. Lungs/Pleura: 222 there are atelectatic changes in both lung bases which have increased compared to the prior study. There is no pleural effusion or pneumothorax. Trachea and central airways are patent. Upper Abdomen: Spleen appears enlarged, unchanged. Musculoskeletal: No chest wall abnormality. No acute or significant osseous findings. IMPRESSION: 1. Bilateral lower lobe atelectatic changes have increased. Can not exclude infection in the right lung base. 2. No lymphadenopathy in the chest. 3. Splenomegaly. Electronically Signed   By: Ronney Asters M.D.   On: 04/09/2021 21:00        Scheduled Meds:  busPIRone  5 mg Oral BID   feeding supplement  237 mL Oral BID BM   metroNIDAZOLE  500 mg Oral Q12H   polyethylene glycol  17 g Oral BID   senna-docusate  1 tablet Oral BID   Continuous Infusions:  cefTRIAXone (ROCEPHIN)  IV       LOS: 3 days    Time spent: 35 minutes    Ivie Maese A Marquist Binstock, MD [facility]   If 7PM-7AM, please contact night-coverage [email or web address]  04/10/2021, 1:52 PM

## 2021-04-10 NOTE — Progress Notes (Signed)
PHARMACY CONSULT NOTE FOR:  OUTPATIENT  PARENTERAL ANTIBIOTIC THERAPY (OPAT)  Indication: PID Regimen: Rocephin 2g IV q24h End date: 04/25/2021  IV antibiotic discharge orders are pended. To discharging provider:  please sign these orders via discharge navigator,  Select New Orders & click on the button choice - Manage This Unsigned Work.     Thank you for allowing pharmacy to be a part of this patient's care.   Malikah Principato S. Alford Highland, PharmD, BCPS Clinical Staff Pharmacist [email or web address] [place] 04/10/2021, 6:15 PM

## 2021-04-10 NOTE — Progress Notes (Signed)
Gynecology Progress Note  Admission Date: 04/06/2021 Current Date: 04/10/2021 4:08 PM  Caitlin Maddox is a 40 y.o. G3P3000 HD#4 admitted for fever, abdominal pain ad pelvic mass   History complicated by: Patient Active Problem List   Diagnosis Date Noted   Hypoalbuminemia due to protein-calorie malnutrition ([facility])    Fever in adult    Adenopathy    Mass of uterine cervix 04/06/2021   Anemia due to blood loss, chronic 04/06/2021   Hyponatremia 04/06/2021   Splenomegaly 04/06/2021   Pelvic mass 04/06/2021   Cellulitis 02/02/2021   Cellulitis and abscess of buttock 02/01/2021   Abnormal uterine bleeding 02/01/2021   Symptomatic anemia 10/04/2020    ROS and patient/family/surgical history, located on admission H&P note dated 04/06/2021, have been reviewed, and there are no changes except as noted below Yesterday/Overnight Events:  N/a  Subjective:  Pt seen.  No major changes in condition.  Pt still notes some low back pain.  Reviewed endometrial biopsy findings which were benign.  Pap results are pending.  Reviewed abx change from ID which is acceptable.  Objective:   Vitals:   04/09/21 1622 04/09/21 2130 04/10/21 0407 04/10/21 0804  BP: 113/69 117/74 107/61 110/77  Pulse: 87 92 78 85  Resp: 18 18 18 18   Temp: 98.6 F (37 C) 99.7 F (37.6 C) 98.3 F (36.8 C) 98.1 F (36.7 C)  TempSrc: Oral Oral Oral Oral  SpO2: 100% 97% 99% 96%  Weight:      Height:        Temp:  [98.1 F (36.7 C)-99.7 F (37.6 C)] 98.1 F (36.7 C) (12/01 0804) Pulse Rate:  [78-92] 85 (12/01 0804) Resp:  [18] 18 (12/01 0804) BP: (107-117)/(61-77) 110/77 (12/01 0804) SpO2:  [96 %-100 %] 96 % (12/01 0804) I/O last 3 completed shifts: In: 816.9 [P.O.:560; IV Piggyback:256.9] Out: -  Total I/O In: 412.2 [P.O.:360; IV Piggyback:52.2] Out: -   Intake/Output Summary (Last 24 hours) at 04/10/2021 1608 Last data filed at 04/10/2021 1501 Gross per 24 hour  Intake 884.16 ml  Output --  Net 884.16 ml      Current Vital Signs 24h Vital Sign Ranges  T 98.1 F (36.7 C) Temp  Avg: 98.7 F (37.1 C)  Min: 98.1 F (36.7 C)  Max: 99.7 F (37.6 C)  BP 110/77 BP  Min: 107/61  Max: 117/74  HR 85 Pulse  Avg: 85.5  Min: 78  Max: 92  RR 18 Resp  Avg: 18  Min: 18  Max: 18  SaO2 96 % Room Air SpO2  Avg: 98 %  Min: 96 %  Max: 100 %       24 Hour I/O Current Shift I/O  Time Ins Outs 11/30 0701 - 12/01 0700 In: 759.4 [P.O.:560] Out: -  12/01 0701 - 12/01 1900 In: 412.2 [P.O.:360] Out: -    Patient Vitals for the past 12 hrs:  BP Temp Temp src Pulse Resp SpO2  04/10/21 0804 110/77 98.1 F (36.7 C) Oral 85 18 96 %     Patient Vitals for the past 24 hrs:  BP Temp Temp src Pulse Resp SpO2  04/10/21 0804 110/77 98.1 F (36.7 C) Oral 85 18 96 %  04/10/21 0407 107/61 98.3 F (36.8 C) Oral 78 18 99 %  04/09/21 2130 117/74 99.7 F (37.6 C) Oral 92 18 97 %  04/09/21 1622 113/69 98.6 F (37 C) Oral 87 18 100 %    Physical exam: General appearance: alert, cooperative, appears stated  age, and no distress Abdomen: soft, non-tender; bowel sounds normal; no masses,  no organomegaly GU: No gross VB Lungs:  n/a Heart: n/a Extremities: no lower extremity edema Skin: WNL Psych: appropriate Neurologic: Grossly normal  Medications Current Facility-Administered Medications  Medication Dose Route Frequency Provider Last Rate Last Admin   acetaminophen (TYLENOL) tablet 650 mg  650 mg Oral Q6H PRN Lenore Cordia, MD   650 mg at 04/07/21 2123   Or   acetaminophen (TYLENOL) suppository 650 mg  650 mg Rectal Q6H PRN Lenore Cordia, MD       alum & mag hydroxide-simeth (MAALOX/MYLANTA) 200-200-20 MG/5ML suspension 30 mL  30 mL Oral Q6H PRN Allie Bossier, MD   30 mL at 04/07/21 2002   busPIRone (BUSPAR) tablet 5 mg  5 mg Oral BID Lenore Cordia, MD   5 mg at 04/10/21 0932   cefTRIAXone (ROCEPHIN) 2 g in sodium chloride 0.9 % 100 mL IVPB  2 g Intravenous Q24H Tommy Medal, Lavell Islam, MD 200 mL/hr at  04/10/21 1418 2 g at 04/10/21 1418   feeding supplement (ENSURE ENLIVE / ENSURE PLUS) liquid 237 mL  237 mL Oral BID BM Cross, Melissa D, NP   237 mL at 04/10/21 1423   HYDROcodone-acetaminophen (NORCO/VICODIN) 5-325 MG per tablet 1-2 tablet  1-2 tablet Oral Q4H PRN Lenore Cordia, MD   2 tablet at 04/10/21 1235   metroNIDAZOLE (FLAGYL) tablet 500 mg  500 mg Oral Q12H Tommy Medal, Lavell Islam, MD   500 mg at 04/10/21 1420   ondansetron (ZOFRAN) tablet 4 mg  4 mg Oral Q6H PRN Lenore Cordia, MD   4 mg at 04/07/21 1153   Or   ondansetron (ZOFRAN) injection 4 mg  4 mg Intravenous Q6H PRN Lenore Cordia, MD       polyethylene glycol (MIRALAX / GLYCOLAX) packet 17 g  17 g Oral BID Regalado, Belkys A, MD   17 g at 04/10/21 0933   senna-docusate (Senokot-S) tablet 1 tablet  1 tablet Oral BID Regalado, Belkys A, MD   1 tablet at 04/10/21 0932      Labs  Recent Labs  Lab 04/08/21 0122 04/09/21 0117 04/10/21 0117  WBC 6.4 8.1 10.2  HGB 7.8* 8.3* 8.1*  HCT 24.6* 26.1* 25.2*  PLT 220 258 267    Recent Labs  Lab 04/08/21 0122 04/09/21 0117 04/10/21 0117  NA 133* 132* 133*  K 3.8 3.9 4.1  CL 99 99 99  CO2 27 26 25   BUN <5* <5* <5*  CREATININE 0.67 0.67 0.68  CALCIUM 8.2* 8.4* 8.4*  PROT 7.0 7.3 7.5  BILITOT 0.2* 0.4 0.4  ALKPHOS 98 107 101  ALT 35 32 31  AST 24 25 33  GLUCOSE 97 99 105*    Radiology  Chest CT with atelectasis  Assessment & Plan:  Pelvic mass -Will defer to ID for antibiotic regimen -pap is pending -pt has follow with Dr. Berline Lopes for removal of pelvic mass in the next few weeks. -Pt previously scheduled for follow up at [facility] regarding heavy menses and fibroids -Gyn team will sign off at this time, we can be reconsulted or messaged as needed.  Code Status: Full Code   Lynnda Shields, MD Attending Center for [facility])

## 2021-04-10 NOTE — Progress Notes (Signed)
Peripherally Inserted Central Catheter Placement  The IV Nurse has discussed with the patient and/or persons authorized to consent for the patient, the purpose of this procedure and the potential benefits and risks involved with this procedure.  The benefits include less needle sticks, lab draws from the catheter, and the patient may be discharged home with the catheter. Risks include, but not limited to, infection, bleeding, blood clot (thrombus formation), and puncture of an artery; nerve damage and irregular heartbeat and possibility to perform a PICC exchange if needed/ordered by physician.  Alternatives to this procedure were also discussed.  Bard Power PICC patient education guide, fact sheet on infection prevention and patient information card has been provided to patient /or left at bedside.    PICC Placement Documentation  PICC Single Lumen 04/10/21 Right Brachial 39 cm 0 cm (Active)  Indication for Insertion or Continuance of Line Home intravenous therapies (PICC only) 04/10/21 2121  Exposed Catheter (cm) 0 cm 04/10/21 2121  Site Assessment Clean;Dry;Intact 04/10/21 2121  Line Status Flushed;Saline locked;Blood return noted 04/10/21 2121  Dressing Type Transparent 04/10/21 2121  Dressing Status Clean;Dry;Intact 04/10/21 2121  Antimicrobial disc in place? Yes 04/10/21 2121  Safety Lock Not Applicable 81/44/81 8563  Dressing Intervention New dressing 04/10/21 2121  Dressing Change Due 04/17/21 04/10/21 2121       Erikson Danzy, Nicolette Bang 04/10/2021, 9:24 PM

## 2021-04-10 NOTE — Consult Note (Signed)
Date of Admission:  04/06/2021          Reason for Consult:  Possible infection of large cystic fluid collection in the abdomen   Referring Provider: Niel Hummer, MD   Assessment:  Large cystic lesion in the cul-de-sac with fluid-filled level and nodular components adjacent to the ovaries Mild Truman Hayward complex cystic lesion and to the right ovary Uterine fibroids IUD Splenomegaly with retroperitoneal and retrocrural lymphadenopathy Right external iliac lymphadenopathy Anorexia and weight loss Anxiety Bilateral lower lobe atelectasis  Plan:  Switch to Ceftriaxone 2 grams IV daily and vaginal 500 mg twice daily orally and IF this is affordable option would favor DC on this regimen with follow-up in our clinic with plans to use this regimen for 2 weeks minimum at that time with re-assessment and consideration of switch to oral antibiotics Is there a way she could have definitve surgery to define this pelvic mass AND her lymph nodes, sooner than the end of December? Check HIV with generation antibody as well as hepatitis serologies,. LDH, EBV and CMV serologies I will discuss preexposure prophylaxis vs HIV with her in the clinic for further  Principal Problem:   Mass of uterine cervix Active Problems:   Abnormal uterine bleeding   Anemia due to blood loss, chronic   Hyponatremia   Splenomegaly   Pelvic mass   Hypoalbuminemia due to protein-calorie malnutrition (HCC)   Fever in adult   Adenopathy   Scheduled Meds:  busPIRone  5 mg Oral BID   feeding supplement  237 mL Oral BID BM   metroNIDAZOLE  500 mg Oral Q12H   polyethylene glycol  17 g Oral BID   senna-docusate  1 tablet Oral BID   Continuous Infusions:  cefTRIAXone (ROCEPHIN)  IV     PRN Meds:.acetaminophen **OR** acetaminophen, alum & mag hydroxide-simeth, HYDROcodone-acetaminophen, ondansetron **OR** ondansetron (ZOFRAN) IV  HPI: Caitlin Maddox is a 40 y.o. female with history of fibroids dysfunctional  uterine bleeding, IUD generalized anxiety disorder who had developed fairly severe abdominal pain and was evaluated in the urgent care and ER on April 02, 2021.  She was found to have a large complex cystic mass in the left adnexa that was concerning for potential ovarian neoplasm by pelvic ultrasound.  She was given appoint with OB/GYN as well as prescription for Flagyl.  Return to the ER yet again on November 27 due to severe abdominal pain refractory to medication she could take at home for pain.  She was also having fevers chills and diaphoresis as well as transient shortness of breath.  CT abdomen pelvis had shown is trace pleural effusion with splenomegaly and retroperitoneal and retrocrural lymphadenopathy.  Additionally there was a 9 cm fluid density arising from the uterus cervix that seemed inseparable from them as well as the rectum.  This was followed by MRI of the abdomen pelvis continue to show a large cystic lesion in the cul-de-sac with a fluid level with some marginal enhancement but also nodular enhancing components on the left anterior margin.  Radiology were skeptical that it would represent an ovarian malignancy and they felt was more likely a hemorrhagic cystic lesion or less likely an abscess.  There is also edema found on either ovary and it is felt that the this lesion could be arising from either ovary.  There is also another complex cystic lesion but without nodular enhancement anterior to the right ovary along with uterine fibroids and external iliac adenopathy.  She had blood cultures  done on admission which failed to grow any organisms.  She had a pelvic exam which was negative for gonorrhea and chlamydia she has been on Zosyn since the 27th and doxycycline in the last 2 days.  Obstetrics and gynecology have seen the patient and there are plans for surgery to address her pelvic mass but not until the end of December.  It certainly possible that some of her times could  be due to an infection but that would not explain her splenomegaly nor do I believe the degree of lymphadenopathy.  Given that I do not have any culture data to guide therapy and that she has been on quite broad-spectrum antibiotics my preference would be to give her a combination of IV ceftriaxone along with oral metronidazole.  She and her mother who was involved in her care are a bit apprehensive about what the cost of this will be.  We will switch her to ceftriaxone and oral Flagyl for now and investigate with the cost of IV ceftriaxone at home with oral Flagyl would be.  In the interim I will schedule her for follow-up with me.  If the IV options or not an option we can come up with an oral option likely cefdinir and metronidazole.  I do not think she needs doxycycline given that chlamydia was not isolated from her pelvic exam.   I spent 84 minutes with the patient including than 50% of the time in face to face counseling of the patient.  The differential diagnosis of her cystic mass includes infection malignancy bleed, as well as her splenomegaly and lymphadenopathy weight loss, personally reviewing CT of the abdomen pelvis CT of the chest and MRI of the pelvis performed during this admission as well as her updated blood cultures CBC BMP along with personally reviewing along with review of medical records in preparation for the visit and during the visit and in coordination of her care.     Review of Systems: Review of Systems  Constitutional:  Positive for fever, malaise/fatigue and weight loss. Negative for chills.  HENT:  Negative for congestion and sore throat.   Eyes:  Negative for blurred vision and photophobia.  Respiratory:  Negative for cough, shortness of breath and wheezing.   Cardiovascular:  Negative for chest pain, palpitations and leg swelling.  Gastrointestinal:  Positive for abdominal pain and nausea. Negative for blood in stool, constipation, diarrhea, heartburn,  melena and vomiting.  Genitourinary:  Negative for dysuria, flank pain and hematuria.  Musculoskeletal:  Negative for back pain, falls, joint pain and myalgias.  Skin:  Negative for itching and rash.  Neurological:  Positive for weakness. Negative for dizziness, focal weakness, loss of consciousness and headaches.  Endo/Heme/Allergies:  Does not bruise/bleed easily.  Psychiatric/Behavioral:  Negative for depression and suicidal ideas. The patient is nervous/anxious. The patient does not have insomnia.    Past Medical History:  Diagnosis Date   Dysfunctional uterine bleeding    Symptomatic anemia     Social History   Tobacco Use   Smoking status: Former    Packs/day: 1.00    Years: 33.00    Pack years: 33.00    Types: Cigarettes    Quit date: 10/09/2020    Years since quitting: 0.5   Smokeless tobacco: Never  Substance Use Topics   Alcohol use: No   Drug use: No    Family History  Problem Relation Age of Onset   Hypertension Mother    Allergies  Allergen Reactions  Clindamycin/Lincomycin Anaphylaxis and Swelling    OBJECTIVE: Blood pressure 110/77, pulse 85, temperature 98.1 F (36.7 C), temperature source Oral, resp. rate 18, height 6' (1.829 m), weight 86.2 kg, last menstrual period 03/28/2021, SpO2 96 %.  Physical Exam Constitutional:      General: She is not in acute distress.    Appearance: Normal appearance. She is well-developed. She is not ill-appearing or diaphoretic.  HENT:     Head: Normocephalic and atraumatic.     Right Ear: Hearing and external ear normal.     Left Ear: Hearing and external ear normal.     Nose: No nasal deformity or rhinorrhea.  Eyes:     General: No scleral icterus.    Conjunctiva/sclera: Conjunctivae normal.     Right eye: Right conjunctiva is not injected.     Left eye: Left conjunctiva is not injected.     Pupils: Pupils are equal, round, and reactive to light.  Neck:     Vascular: No JVD.  Cardiovascular:     Rate and  Rhythm: Normal rate and regular rhythm.     Heart sounds: Normal heart sounds, S1 normal and S2 normal. No murmur heard.   No friction rub. No gallop.  Pulmonary:     Effort: Pulmonary effort is normal. No respiratory distress.     Breath sounds: Normal breath sounds. No stridor. No wheezing or rhonchi.  Abdominal:     General: Bowel sounds are normal. There is distension.     Palpations: Abdomen is soft.     Tenderness: There is abdominal tenderness.  Musculoskeletal:        General: Normal range of motion.     Right shoulder: Normal.     Left shoulder: Normal.     Cervical back: Normal range of motion and neck supple.     Right hip: Normal.     Left hip: Normal.     Right knee: Normal.     Left knee: Normal.  Lymphadenopathy:     Head:     Right side of head: No submandibular, preauricular or posterior auricular adenopathy.     Left side of head: No submandibular, preauricular or posterior auricular adenopathy.     Cervical: No cervical adenopathy.     Right cervical: No superficial or deep cervical adenopathy.    Left cervical: No superficial or deep cervical adenopathy.  Skin:    General: Skin is warm and dry.     Coloration: Skin is not pale.     Findings: No abrasion, bruising, ecchymosis, erythema, lesion or rash.     Nails: There is no clubbing.  Neurological:     General: No focal deficit present.     Mental Status: She is alert and oriented to person, place, and time.     Sensory: No sensory deficit.     Coordination: Coordination normal.     Gait: Gait normal.  Psychiatric:        Attention and Perception: She is attentive.        Mood and Affect: Mood normal.        Speech: Speech normal.        Behavior: Behavior normal. Behavior is cooperative.        Thought Content: Thought content normal.        Judgment: Judgment normal.    Lab Results Lab Results  Component Value Date   WBC 10.2 04/10/2021   HGB 8.1 (L) 04/10/2021   HCT 25.2 (L) 04/10/2021  MCV  90.6 04/10/2021   PLT 267 04/10/2021    Lab Results  Component Value Date   CREATININE 0.68 04/10/2021   BUN <5 (L) 04/10/2021   NA 133 (L) 04/10/2021   K 4.1 04/10/2021   CL 99 04/10/2021   CO2 25 04/10/2021    Lab Results  Component Value Date   ALT 31 04/10/2021   AST 33 04/10/2021   ALKPHOS 101 04/10/2021   BILITOT 0.4 04/10/2021     Microbiology: Recent Results (from the past 240 hour(s))  Wet prep, genital     Status: Abnormal   Collection Time: 04/02/21  7:51 PM   Specimen: PATH Cytology Cervicovaginal Ancillary Only  Result Value Ref Range Status   Yeast Wet Prep HPF POC NONE SEEN NONE SEEN Final   Trich, Wet Prep NONE SEEN NONE SEEN Final   Clue Cells Wet Prep HPF POC PRESENT (A) NONE SEEN Final   WBC, Wet Prep HPF POC <10 <10 Final    Comment: Please note change in reference range.   Sperm NONE SEEN  Final    Comment: Performed at [facility], [place] [address] [place], [place] [address]  Blood culture (routine x 2)     Status: None (Preliminary result)   Collection Time: 04/06/21  4:54 PM   Specimen: BLOOD  Result Value Ref Range Status   Specimen Description BLOOD RIGHT ANTECUBITAL  Final   Special Requests   Final    BOTTLES DRAWN AEROBIC AND ANAEROBIC Blood Culture adequate volume   Culture   Final    NO GROWTH 4 DAYS Performed at [facility], [place] [address] [place], [place] [address]    Report Status PENDING  Incomplete  Resp Panel by RT-PCR (Flu A&B, Covid) Nasopharyngeal Swab     Status: None   Collection Time: 04/06/21  5:57 PM   Specimen: Nasopharyngeal Swab; Nasopharyngeal(NP) swabs in vial transport medium  Result Value Ref Range Status   SARS Coronavirus 2 by RT PCR NEGATIVE NEGATIVE Final    Comment: (NOTE) SARS-CoV-2 target nucleic acids are NOT DETECTED.  The SARS-CoV-2 RNA is generally detectable in upper respiratory specimens during the acute phase of infection. The lowest concentration of SARS-CoV-2 viral copies this  assay can detect is 138 copies/mL. A negative result does not preclude SARS-Cov-2 infection and should not be used as the sole basis for treatment or other patient management decisions. A negative result may occur with  improper specimen collection/handling, submission of specimen other than nasopharyngeal swab, presence of viral mutation(s) within the areas targeted by this assay, and inadequate number of viral copies(<138 copies/mL). A negative result must be combined with clinical observations, patient history, and epidemiological information. The expected result is Negative.  Fact Sheet for Patients:  EntrepreneurPulse.com.au  Fact Sheet for Healthcare Providers:  IncredibleEmployment.be  This test is no t yet approved or cleared by the [place] FDA and  has been authorized for detection and/or diagnosis of SARS-CoV-2 by FDA under an Emergency Use Authorization (EUA). This EUA will remain  in effect (meaning this test can be used) for the duration of the COVID-19 declaration under Section 564(b)(1) of the Act, 21 U.S.C.section 360bbb-3(b)(1), unless the authorization is terminated  or revoked sooner.       Influenza A by PCR NEGATIVE NEGATIVE Final   Influenza B by PCR NEGATIVE NEGATIVE Final    Comment: (NOTE) The Xpert Xpress SARS-CoV-2/FLU/RSV plus assay is intended as an aid in the diagnosis of influenza  from Nasopharyngeal swab specimens and should not be used as a sole basis for treatment. Nasal washings and aspirates are unacceptable for Xpert Xpress SARS-CoV-2/FLU/RSV testing.  Fact Sheet for Patients: EntrepreneurPulse.com.au  Fact Sheet for Healthcare Providers: IncredibleEmployment.be  This test is not yet approved or cleared by the [place] FDA and has been authorized for detection and/or diagnosis of SARS-CoV-2 by FDA under an Emergency Use Authorization (EUA). This EUA will  remain in effect (meaning this test can be used) for the duration of the COVID-19 declaration under Section 564(b)(1) of the Act, 21 U.S.C. section 360bbb-3(b)(1), unless the authorization is terminated or revoked.  Performed at [facility], [place] [address] [place], [place] [address]   Blood culture (routine x 2)     Status: None (Preliminary result)   Collection Time: 04/06/21  6:58 PM   Specimen: BLOOD  Result Value Ref Range Status   Specimen Description BLOOD RIGHT ANTECUBITAL  Final   Special Requests   Final    BOTTLES DRAWN AEROBIC AND ANAEROBIC Blood Culture results may not be optimal due to an inadequate volume of blood received in culture bottles   Culture   Final    NO GROWTH 4 DAYS Performed at [facility], [place] [address] [place], [place] [address]    Report Status PENDING  Incomplete    Alcide Evener, [facility] [phone] pager  04/10/2021, 11:18 AM

## 2021-04-10 NOTE — Progress Notes (Signed)
Mobility Specialist Progress Note:   04/10/21 1230  Mobility  Activity Ambulated in hall  Level of Assistance Independent  Assistive Device Other (Comment) (IV Pole)  Distance Ambulated (ft) 700 ft  Mobility Ambulated independently in hallway  Mobility Response Tolerated well  Mobility performed by Mobility specialist  $Mobility charge 1 Mobility   Pt requesting pain meds upon arrival d/t significant pain. Ambulated after and was asx. Pt back in bed with all needs met.   Nelta Numbers Mobility Specialist  Phone [phone]

## 2021-04-11 DIAGNOSIS — R509 Fever, unspecified: Secondary | ICD-10-CM | POA: Diagnosis not present

## 2021-04-11 DIAGNOSIS — N939 Abnormal uterine and vaginal bleeding, unspecified: Secondary | ICD-10-CM | POA: Diagnosis not present

## 2021-04-11 DIAGNOSIS — N888 Other specified noninflammatory disorders of cervix uteri: Secondary | ICD-10-CM | POA: Diagnosis not present

## 2021-04-11 DIAGNOSIS — R599 Enlarged lymph nodes, unspecified: Secondary | ICD-10-CM | POA: Diagnosis not present

## 2021-04-11 LAB — CULTURE, BLOOD (ROUTINE X 2)
Culture: NO GROWTH
Culture: NO GROWTH
Special Requests: ADEQUATE

## 2021-04-11 LAB — COMPREHENSIVE METABOLIC PANEL
ALT: 30 U/L (ref 0–44)
AST: 31 U/L (ref 15–41)
Albumin: 2.5 g/dL — ABNORMAL LOW (ref 3.5–5.0)
Alkaline Phosphatase: 95 U/L (ref 38–126)
Anion gap: 7 (ref 5–15)
BUN: <5 mg/dL — ABNORMAL LOW (ref 6–20)
CO2: 26 mmol/L (ref 22–32)
Calcium: 8.6 mg/dL — ABNORMAL LOW (ref 8.9–10.3)
Chloride: 100 mmol/L (ref 98–111)
Creatinine, Ser: 0.74 mg/dL (ref 0.44–1.00)
GFR, Estimated: >60 mL/min (ref >60)
Glucose, Bld: 101 mg/dL — ABNORMAL HIGH (ref 70–99)
Potassium: 4.1 mmol/L (ref 3.5–5.1)
Sodium: 133 mmol/L — ABNORMAL LOW (ref 135–145)
Total Bilirubin: 0.3 mg/dL (ref 0.3–1.2)
Total Protein: 7.9 g/dL (ref 6.5–8.1)

## 2021-04-11 LAB — CBC WITH DIFFERENTIAL/PLATELET
Abs Immature Granulocytes: 0.1 10*3/uL — ABNORMAL HIGH (ref 0.00–0.07)
Basophils Absolute: 0 10*3/uL (ref 0.0–0.1)
Basophils Relative: 0 %
Eosinophils Absolute: 0.1 10*3/uL (ref 0.0–0.5)
Eosinophils Relative: 1 %
HCT: 26.2 % — ABNORMAL LOW (ref 36.0–46.0)
Hemoglobin: 8 g/dL — ABNORMAL LOW (ref 12.0–15.0)
Immature Granulocytes: 1 %
Lymphocytes Relative: 14 %
Lymphs Abs: 1.6 10*3/uL (ref 0.7–4.0)
MCH: 28.5 pg (ref 26.0–34.0)
MCHC: 30.5 g/dL (ref 30.0–36.0)
MCV: 93.2 fL (ref 80.0–100.0)
Monocytes Absolute: 1.1 10*3/uL — ABNORMAL HIGH (ref 0.1–1.0)
Monocytes Relative: 9 %
Neutro Abs: 8.6 10*3/uL — ABNORMAL HIGH (ref 1.7–7.7)
Neutrophils Relative %: 75 %
Platelets: 290 10*3/uL (ref 150–400)
RBC: 2.81 MIL/uL — ABNORMAL LOW (ref 3.87–5.11)
RDW: 19.3 % — ABNORMAL HIGH (ref 11.5–15.5)
WBC: 11.5 10*3/uL — ABNORMAL HIGH (ref 4.0–10.5)
nRBC: 0 % (ref 0.0–0.2)

## 2021-04-11 LAB — PHOSPHORUS: Phosphorus: 4 mg/dL (ref 2.5–4.6)

## 2021-04-11 LAB — HEPATITIS A ANTIBODY, TOTAL: hep A Total Ab: NONREACTIVE

## 2021-04-11 LAB — HEPATITIS C ANTIBODY: HCV Ab: NONREACTIVE

## 2021-04-11 LAB — HEPATITIS B SURFACE ANTIGEN: Hepatitis B Surface Ag: NONREACTIVE

## 2021-04-11 LAB — MAGNESIUM: Magnesium: 2.1 mg/dL (ref 1.7–2.4)

## 2021-04-11 MED ORDER — HYDROCODONE-ACETAMINOPHEN 5-325 MG PO TABS: 1.0000 | ORAL_TABLET | Freq: Four times a day (QID) | ORAL | 0 refills | Status: DC | PRN

## 2021-04-11 MED ORDER — METRONIDAZOLE 500 MG PO TABS: 500.0000 mg | ORAL_TABLET | Freq: Two times a day (BID) | ORAL | 0 refills | Status: DC

## 2021-04-11 MED ORDER — CEFTRIAXONE IV (FOR PTA / DISCHARGE USE ONLY): 2.0000 g | INTRAVENOUS | 0 refills | Status: AC

## 2021-04-11 MED ORDER — POLYETHYLENE GLYCOL 3350 17 G PO PACK: 17.0000 g | PACK | Freq: Two times a day (BID) | ORAL | 0 refills | Status: DC

## 2021-04-11 MED ORDER — HEPARIN SOD (PORK) LOCK FLUSH 100 UNIT/ML IV SOLN
250.0000 [IU] | INTRAVENOUS | Status: AC | PRN
  Administered 2021-04-11: 250 [IU]
  Filled 2021-04-11: qty 2.5

## 2021-04-11 NOTE — Progress Notes (Signed)
Caitlin Maddox to be D/C'd  per MD order.  Discussed with the patient and all questions fully answered.  VSS, Skin clean, dry and intact without evidence of skin break down, no evidence of skin tears noted.   An After Visit Summary was printed and given to the patient. Patient received prescription.  D/c education completed with patient/family including follow up instructions, medication list, d/c activities limitations if indicated, with other d/c instructions as indicated by MD - patient able to verbalize understanding, all questions fully answered.   Patient instructed to return to ED, call 911, or call MD for any changes in condition.   Patient to be escorted via [place], and D/C home via private auto.   CM assisted with HHN, patient for IV home infusion.

## 2021-04-11 NOTE — Progress Notes (Signed)
Subjective: No new complaints   Antibiotics:  Anti-infectives (From admission, onward)    Start     Dose/Rate Route Frequency Ordered Stop   04/11/21 0000  cefTRIAXone (ROCEPHIN) IVPB        2 g Intravenous Every 24 hours 04/11/21 0815 04/25/21 2359   04/11/21 0000  metroNIDAZOLE (FLAGYL) 500 MG tablet        500 mg Oral Every 12 hours 04/11/21 0815 04/25/21 2359   04/10/21 1200  cefTRIAXone (ROCEPHIN) 2 g in sodium chloride 0.9 % 100 mL IVPB        2 g 200 mL/hr over 30 Minutes Intravenous Every 24 hours 04/10/21 1109     04/10/21 1200  metroNIDAZOLE (FLAGYL) tablet 500 mg        500 mg Oral Every 12 hours 04/10/21 1109     04/09/21 1415  doxycycline (VIBRA-TABS) tablet 100 mg  Status:  Discontinued        100 mg Oral Every 12 hours 04/09/21 1319 04/10/21 1109   04/06/21 2100  piperacillin-tazobactam (ZOSYN) IVPB 3.375 g  Status:  Discontinued        3.375 g 12.5 mL/hr over 240 Minutes Intravenous Every 8 hours 04/06/21 2050 04/10/21 1109       Medications: Scheduled Meds:  busPIRone  5 mg Oral BID   Chlorhexidine Gluconate Cloth  6 each Topical Q2200   feeding supplement  237 mL Oral BID BM   metroNIDAZOLE  500 mg Oral Q12H   polyethylene glycol  17 g Oral BID   senna-docusate  1 tablet Oral BID   Continuous Infusions:  cefTRIAXone (ROCEPHIN)  IV 2 g (04/10/21 1418)   PRN Meds:.acetaminophen **OR** acetaminophen, alum & mag hydroxide-simeth, HYDROcodone-acetaminophen, ondansetron **OR** ondansetron (ZOFRAN) IV, sodium chloride flush    Objective: Weight change:   Intake/Output Summary (Last 24 hours) at 04/11/2021 1110 Last data filed at 04/11/2021 0846 Gross per 24 hour  Intake 712.16 ml  Output --  Net 712.16 ml   Blood pressure 108/71, pulse 80, temperature 98.2 F (36.8 C), temperature source Oral, resp. rate 20, height 6' (1.829 m), weight 86.2 kg, last menstrual period 03/28/2021, SpO2 99 %. Temp:  [98.2 F (36.8 C)-100.2 F (37.9 C)] 98.2  F (36.8 C) (12/02 0829) Pulse Rate:  [80-89] 80 (12/02 0829) Resp:  [16-20] 20 (12/02 0829) BP: (108-122)/(71-75) 108/71 (12/02 0829) SpO2:  [97 %-100 %] 99 % (12/02 0829)  Physical Exam: Physical Exam Constitutional:      General: She is not in acute distress.    Appearance: She is well-developed. She is not diaphoretic.  HENT:     Head: Normocephalic and atraumatic.     Right Ear: External ear normal.     Left Ear: External ear normal.     Mouth/Throat:     Pharynx: No oropharyngeal exudate.  Eyes:     General: No scleral icterus.    Conjunctiva/sclera: Conjunctivae normal.     Pupils: Pupils are equal, round, and reactive to light.  Cardiovascular:     Rate and Rhythm: Normal rate and regular rhythm.     Heart sounds:    No friction rub.  Pulmonary:     Effort: Pulmonary effort is normal. No respiratory distress.     Breath sounds: No wheezing.  Abdominal:     General: There is distension.     Palpations: Abdomen is soft.     Tenderness: There is no rebound.  Musculoskeletal:  General: No tenderness. Normal range of motion.  Lymphadenopathy:     Cervical: No cervical adenopathy.  Skin:    General: Skin is warm and dry.     Coloration: Skin is not pale.     Findings: No erythema or rash.  Neurological:     General: No focal deficit present.     Mental Status: She is alert and oriented to person, place, and time.     Motor: No abnormal muscle tone.     Coordination: Coordination normal.  Psychiatric:        Mood and Affect: Mood normal.        Behavior: Behavior normal.        Thought Content: Thought content normal.        Judgment: Judgment normal.     CBC:    BMET Recent Labs    04/10/21 0117 04/11/21 0446  NA 133* 133*  K 4.1 4.1  CL 99 100  CO2 25 26  GLUCOSE 105* 101*  BUN <5* <5*  CREATININE 0.68 0.74  CALCIUM 8.4* 8.6*     Liver Panel  Recent Labs    04/10/21 0117 04/11/21 0446  PROT 7.5 7.9  ALBUMIN 2.4* 2.5*  AST 33 31   ALT 31 30  ALKPHOS 101 95  BILITOT 0.4 0.3       Sedimentation Rate No results for input(s): ESRSEDRATE in the last 72 hours. C-Reactive Protein No results for input(s): CRP in the last 72 hours.  Micro Results: Recent Results (from the past 720 hour(s))  Wet prep, genital     Status: Abnormal   Collection Time: 04/02/21  7:51 PM   Specimen: PATH Cytology Cervicovaginal Ancillary Only  Result Value Ref Range Status   Yeast Wet Prep HPF POC NONE SEEN NONE SEEN Final   Trich, Wet Prep NONE SEEN NONE SEEN Final   Clue Cells Wet Prep HPF POC PRESENT (A) NONE SEEN Final   WBC, Wet Prep HPF POC <10 <10 Final    Comment: Please note change in reference range.   Sperm NONE SEEN  Final    Comment: Performed at [facility], [place] [address], [place], [place] [address]  Blood culture (routine x 2)     Status: None   Collection Time: 04/06/21  4:54 PM   Specimen: BLOOD  Result Value Ref Range Status   Specimen Description BLOOD RIGHT ANTECUBITAL  Final   Special Requests   Final    BOTTLES DRAWN AEROBIC AND ANAEROBIC Blood Culture adequate volume   Culture   Final    NO GROWTH 5 DAYS Performed at [facility], [place] [address], [place], [place] [address]    Report Status 04/11/2021 FINAL  Final  Resp Panel by RT-PCR (Flu A&B, Covid) Nasopharyngeal Swab     Status: None   Collection Time: 04/06/21  5:57 PM   Specimen: Nasopharyngeal Swab; Nasopharyngeal(NP) swabs in vial transport medium  Result Value Ref Range Status   SARS Coronavirus 2 by RT PCR NEGATIVE NEGATIVE Final    Comment: (NOTE) SARS-CoV-2 target nucleic acids are NOT DETECTED.  The SARS-CoV-2 RNA is generally detectable in upper respiratory specimens during the acute phase of infection. The lowest concentration of SARS-CoV-2 viral copies this assay can detect is 138 copies/mL. A negative result does not preclude SARS-Cov-2 infection and should not be used as the sole basis for treatment or other patient  management decisions. A negative result may occur with  improper specimen collection/handling,  submission of specimen other than nasopharyngeal swab, presence of viral mutation(s) within the areas targeted by this assay, and inadequate number of viral copies(<138 copies/mL). A negative result must be combined with clinical observations, patient history, and epidemiological information. The expected result is Negative.  Fact Sheet for Patients:  EntrepreneurPulse.com.au  Fact Sheet for Healthcare Providers:  IncredibleEmployment.be  This test is no t yet approved or cleared by the [place] FDA and  has been authorized for detection and/or diagnosis of SARS-CoV-2 by FDA under an Emergency Use Authorization (EUA). This EUA will remain  in effect (meaning this test can be used) for the duration of the COVID-19 declaration under Section 564(b)(1) of the Act, 21 U.S.C.section 360bbb-3(b)(1), unless the authorization is terminated  or revoked sooner.       Influenza A by PCR NEGATIVE NEGATIVE Final   Influenza B by PCR NEGATIVE NEGATIVE Final    Comment: (NOTE) The Xpert Xpress SARS-CoV-2/FLU/RSV plus assay is intended as an aid in the diagnosis of influenza from Nasopharyngeal swab specimens and should not be used as a sole basis for treatment. Nasal washings and aspirates are unacceptable for Xpert Xpress SARS-CoV-2/FLU/RSV testing.  Fact Sheet for Patients: EntrepreneurPulse.com.au  Fact Sheet for Healthcare Providers: IncredibleEmployment.be  This test is not yet approved or cleared by the [place] FDA and has been authorized for detection and/or diagnosis of SARS-CoV-2 by FDA under an Emergency Use Authorization (EUA). This EUA will remain in effect (meaning this test can be used) for the duration of the COVID-19 declaration under Section 564(b)(1) of the Act, 21 U.S.C. section 360bbb-3(b)(1),  unless the authorization is terminated or revoked.  Performed at [facility], [place] [address] [place], [place] [address]   Blood culture (routine x 2)     Status: None   Collection Time: 04/06/21  6:58 PM   Specimen: BLOOD  Result Value Ref Range Status   Specimen Description BLOOD RIGHT ANTECUBITAL  Final   Special Requests   Final    BOTTLES DRAWN AEROBIC AND ANAEROBIC Blood Culture results may not be optimal due to an inadequate volume of blood received in culture bottles   Culture   Final    NO GROWTH 5 DAYS Performed at [facility], [place] [address], [place], [place] [address]    Report Status 04/11/2021 FINAL  Final    Studies/Results: CT CHEST W CONTRAST  Result Date: 04/09/2021 CLINICAL DATA:  Lymphadenopathy.  Abdominal pain and splenomegaly. EXAM: CT CHEST WITH CONTRAST TECHNIQUE: Multidetector CT imaging of the chest was performed during intravenous contrast administration. CONTRAST:  20mL OMNIPAQUE IOHEXOL 300 MG/ML  SOLN COMPARISON:  MR pelvis 04/07/2021. CT abdomen and pelvis 04/06/2021. FINDINGS: Cardiovascular: No significant vascular findings. Normal heart size. No pericardial effusion. Mediastinum/Nodes: There are no enlarged mediastinal, hilar or axillary lymph nodes. Nonenlarged bilateral axillary lymph nodes are present. The visualized thyroid gland and esophagus are within normal limits. Lungs/Pleura: 222 there are atelectatic changes in both lung bases which have increased compared to the prior study. There is no pleural effusion or pneumothorax. Trachea and central airways are patent. Upper Abdomen: Spleen appears enlarged, unchanged. Musculoskeletal: No chest wall abnormality. No acute or significant osseous findings. IMPRESSION: 1. Bilateral lower lobe atelectatic changes have increased. Can not exclude infection in the right lung base. 2. No lymphadenopathy in the chest. 3. Splenomegaly. Electronically Signed   By: Ronney Asters M.D.   On: 04/09/2021  21:00   [place] EKG SITE RITE  Result Date:  04/10/2021 If Site Rite image not attached, placement could not be confirmed due to current cardiac rhythm.     Assessment/Plan:  INTERVAL HISTORY: She is tolerating ceftriaxone and metronidazole well   Principal Problem:   Mass of uterine cervix Active Problems:   Abnormal uterine bleeding   Anemia due to blood loss, chronic   Hyponatremia   Splenomegaly   Pelvic mass   Hypoalbuminemia due to protein-calorie malnutrition (HCC)   Fever in adult   Adenopathy    Jeffery Gammell is a 40 y.o. female with history of fibroids and uterine bleeding IUD generalizing Zaidi disorder admitted with severe abdominal pain that was evaluated previously and has continued to have a large complex cystic mass in the cul-de-sac with fluid-filled levels and nodular components as well as a mild complex cystic lesion on the right ovary, splenomegaly and lymphadenopathy.  #1 Pelvis mass:  [employer] occludes infection malignancy , a bleed  Trigger with ceftriaxone and metronidazole and I have set up an appointment for her to see me in clinic.  She is going to have definitive surgery with OB/GYN at the end of December.    #2  Splenomegaly and lymphadenopathy: These along with the pelvic mass to me are worrisome for malignancy.  Presumably surgery  by Oby/Gyn will yield more answers  I spent 36 with the patient including  face to face counseling of the patient adding her antibiotic regimen and treatment for infection in the abdomen, personally reviewing CTs MRIs CBC BMP culture data along with review of medical records in preparation for the visit and during the visit and in coordination of her care.   Diagnosis: Uterine mass +/- infection Culture Result: none  Allergies  Allergen Reactions   Clindamycin/Lincomycin Anaphylaxis and Swelling    OPAT Orders Discharge antibiotics to be given via PICC line Discharge  antibiotics: Ceftriaxone 2 g IV daily along with metronidazole 500 mg twice daily Duration: More than 2 weeks End Date: June 26, 2020  [place] Per Protocol:  Home health RN for IV administration and teaching; PICC line care and labs.    Labs weekly while on IV antibiotics: __x CBC with differential __x BMP   __x Please pull PIC at completion of IV antibiotics __ Please leave PIC in place until doctor has seen patient or been notified  Fax weekly labs to [phone]  Clinic Follow Up Appt: s  [facility] has an appointment on 04/21/2021 at 1130 AM with Dr. Tommy Medal  The [facility] is located in the [facility] at  [place] in [place].  [address], which is located to the left of the elevators.  Phone: [phone]  Fax: [phone]  [email or web address]   She should arrive 15 to 30 minutes prior to her appointment.   LOS: 4 days   Alcide Evener 04/11/2021, 11:10 AM

## 2021-04-11 NOTE — TOC Progression Note (Signed)
Transition of Care [facility]) - Progression Note    Patient Details  Name: Caitlin Maddox MRN: [id number] Date of Birth: August 02, 1980  Transition of Care [facility]) CM/SW [place], RN Phone [phone]  04/11/2021, 12:15 PM  Clinical Narrative:    CM follwing up on status of patient discharging home with home IV abx. Per Pam with [facility] for [facility] antibiotics patient is set to go .Education has been done and patient will be discharge tomorrow. [facility] to follow for [facility] [phone]) Info to be added to AVS        Expected Discharge Plan and Services                                                 Social Determinants of Health (SDOH) Interventions    Readmission Risk Interventions No flowsheet data found.

## 2021-04-11 NOTE — Plan of Care (Signed)

## 2021-04-11 NOTE — Discharge Summary (Addendum)
Physician Discharge Summary  Caitlin Maddox HEK:[id number] DOB: 1981-04-17 DOA: 04/06/2021  PCP: Kerin Perna, NP  Admit date: 04/06/2021 Discharge date: 04/11/2021  Admitted From: Home  Disposition:  Home   Recommendations for Outpatient Follow-up:  Follow up with PCP in 1-2 weeks Please obtain BMP/CBC in one week Needs to follow up with Dr Berline Lopes GYN oncology for surgery.   Home Health: yes.   Discharge Condition: Stable.  CODE STATUS: Full code Diet recommendation: Heart Healthy   Brief/Interim Summary: 40 year old with past medical history significant for fibroid uterus with dysfunctional uterine bleeding, anemia due to chronic blood loss, GAD who was recently found to have a large complex cystic mass in the left adnexa concerning for ovarian neoplasm by pelvic ultrasound on 04/02/2021 with sitting in the ED.  Patient had appointment with [facility] gynecology however given return of abdominal pain she presented to the ED again for further evaluation.   Patient reports 1 week history of suprapubic lower abdominal pain.  She also report subjective fevers chills and diaphoresis.  She report chronic heavy menses last.  Chest x-ray minimal left lower lobe patchy opacity.  CT abdomen and pelvis with contrast showed trace right pleural effusion with bibasilar atelectasis.  Splenomegaly with retroperitoneal and retrocrural lymphadenopathy concerning for lymphoproliferative disorder/lymphoma.  Interval development of 9 cm fluid density lesion arising from the uterus service and found to be inseparable from both the uterine cervix as well as the rectum was noted.  IUD felt to be malpositioned.   Gynecology and oncology has been following. GYN oncology recommend treatment with antibiotics and then surgery in couple of weeks.   1-Mass of uterus cervix -Patient underwent endometrial biopsy and Pap smear 11/30 -Alpha-fetoprotein less than 1.8, CEA1.6, CA 125: 25. Within normal range.  -MRI : Large  cystic lesion in the cul-de-sac has fluid level and only mild marginal enhancement noted.  Some nodular enhancing component along the left anterior margin and to lesser extent along the right anterior margin adjacent to the ovaries.  This could represent hemorrhagic cystic lesion or less likely abscess in the cul-de-sac region. Evaluated by gynecology oncology: In regards to pelvic mass this could be related to hemorrhagic Cyst or infection.  There is some nodularity along the wall of the cyst, raising the concern for possible malignancy.  Oncology has some concern that the mass could be infected, presenting almost like PID picture.  Oncology recommended avoiding percutaneous drainage as possible due to increased risk of peritoneal seeding if this was malignancy.  Recommend to treat with IV antibiotics and subsequently changed to oral. -Discussed with Dr. Elgie Congo, plan to transition to oral antibiotic like doxycycline to cover PID for a total of 14 days at discharge.  And antibiotics to cover for pneumonia. -ID consulted. Antibiotics change to IV ceftriaxone and oral flagyl. Recommend IV antibiotics at discharge. CM consulted. Patient will be able to afford IV antibiotics. Plan to discharge on IV ceftriaxone and flagyl  for 2 weeks and close follow up with Dr Berline Lopes onc.  -will provide 5 days of vicodin for pain.   SIRS; : Treating for possible PID: IV ceftriaxone and flagyl.  Blood culture no growth to date.   Splenomegaly with retroperitoneal and retrocrural lymphadenopathy CT abdomen and pelvis concerning for lymphoproliferative disorder/lymphoma She will need PET scan. She will need future follow-up Pathology smear: Normocytic anemia. CT chest negative for lymphadenopathy.    Hyponatremia: Continue with IV fluids   Anemia Due to chronic blood loss in the setting of  fibroid uterus: Hemoglobin is stable -Underwent endometrial biopsy and Pap test by gynecology 11/30   Malpositioned IUD: Removed  by [facility] gynecology   Generalized anxiety disorder continue with BuSpar    Mild protein malnutrition    Discharge Diagnoses:  Principal Problem:   Mass of uterine cervix Active Problems:   Abnormal uterine bleeding   Anemia due to blood loss, chronic   Hyponatremia   Splenomegaly   Pelvic mass   Hypoalbuminemia due to protein-calorie malnutrition ([place])   Fever in adult   Adenopathy    Discharge Instructions  Discharge Instructions     [facility] pharmacist to adjust dose for Vancomycin, Aminoglycosides and other anti-infective therapies as requested by physician.   Complete by: As directed    [facility] to provide Cath Flo 2mg    Complete by: As directed    Administer for PICC line occlusion and as ordered by physician for other access device issues.   Anaphylaxis Kit: Provided to treat any anaphylactic reaction to the medication being provided to the patient if First Dose or when requested by physician   Complete by: As directed    Epinephrine 1mg /ml vial / amp: Administer 0.3mg  (0.66ml) subcutaneously once for moderate to severe anaphylaxis, nurse to call physician and pharmacy when reaction occurs and call 911 if needed for immediate care   Diphenhydramine 50mg /ml IV vial: Administer 25-50mg  IV/IM PRN for first dose reaction, rash, itching, mild reaction, nurse to call physician and pharmacy when reaction occurs   Sodium Chloride 0.9% NS 572ml IV: Administer if needed for hypovolemic blood pressure drop or as ordered by physician after call to physician with anaphylactic reaction   Change dressing on IV access line weekly and PRN   Complete by: As directed    Flush IV access with Sodium Chloride 0.9% and Heparin 10 units/ml or 100 units/ml   Complete by: As directed    Home infusion instructions - [facility]   Complete by: As directed    Instructions: Flush IV access with Sodium Chloride 0.9% and Heparin 10units/ml or 100units/ml   Change  dressing on IV access line: Weekly and PRN   Instructions Cath Flo 2mg : Administer for PICC Line occlusion and as ordered by physician for other access device   [facility] pharmacist to adjust dose for: Vancomycin, Aminoglycosides and other anti-infective therapies as requested by physician   Method of administration may be changed at the discretion of home infusion pharmacist based upon assessment of the patient and/or caregiver's ability to self-administer the medication ordered   Complete by: As directed       Allergies as of 04/11/2021       Reactions   Clindamycin/lincomycin Anaphylaxis, Swelling        Medication List     STOP taking these medications    acetaminophen 500 MG tablet Commonly known as: TYLENOL       TAKE these medications    busPIRone 5 MG tablet Commonly known as: BUSPAR Take 1 tablet (5 mg total) by mouth 2 (two) times daily.   cefTRIAXone  IVPB Commonly known as: ROCEPHIN Inject 2 g into the vein daily for 14 days. Indication:  PID First Dose: No Last Day of Therapy:  04/25/2021 Labs - Once weekly:  CBC/D and BMP, Labs - Every other week:  ESR and CRP Method of administration: IV Push Method of administration may be changed at the discretion of home infusion pharmacist based upon assessment of the patient and/or caregiver's  ability to self-administer the medication ordered.   docusate sodium 250 MG capsule Commonly known as: COLACE Take 250 mg by mouth daily.   ferrous sulfate 325 (65 FE) MG tablet Commonly known as: FerrouSul Take 1 tablet (325 mg total) by mouth 2 (two) times daily.   HYDROcodone-acetaminophen 5-325 MG tablet Commonly known as: NORCO/VICODIN Take 1 tablet by mouth every 6 (six) hours as needed.   ibuprofen 200 MG tablet Commonly known as: ADVIL Take 600 mg by mouth every 6 (six) hours as needed for mild pain, headache or cramping.   metroNIDAZOLE 500 MG tablet Commonly known as: FLAGYL Take 1 tablet  (500 mg total) by mouth every 12 (twelve) hours for 14 days. What changed: when to take this   polyethylene glycol 17 g packet Commonly known as: MIRALAX / GLYCOLAX Take 17 g by mouth 2 (two) times daily.   vitamin B-12 500 MCG tablet Commonly known as: CYANOCOBALAMIN Take 1 tablet (500 mcg total) by mouth daily.               Discharge Care Instructions  (From admission, onward)           Start     Ordered   04/11/21 0000  Change dressing on IV access line weekly and PRN  (Home infusion instructions - [facility] )        04/11/21 0815            Allergies  Allergen Reactions   Clindamycin/Lincomycin Anaphylaxis and Swelling    Consultations: ID GYN oncology GYN   Procedures/Studies: DG Chest 2 View  Result Date: 04/06/2021 CLINICAL DATA:  Shortness of breath and cough. EXAM: CHEST - 2 VIEW COMPARISON:  Chest x-ray 03/17/2021 FINDINGS: There is some minimal strandy and patchy opacities in the left lung base seen best on the lateral view. There is no pleural effusion or pneumothorax. Cardiomediastinal silhouette is within normal limits. No acute fractures are seen. IMPRESSION: 1. Minimal left lower lobe patchy opacities worrisome for infection. 2. Followup PA and lateral chest X-ray is recommended in 3-4 weeks following trial of antibiotic therapy to ensure resolution and exclude underlying malignancy. Electronically Signed   By: Ronney Asters M.D.   On: 04/06/2021 17:41   CT CHEST W CONTRAST  Result Date: 04/09/2021 CLINICAL DATA:  Lymphadenopathy.  Abdominal pain and splenomegaly. EXAM: CT CHEST WITH CONTRAST TECHNIQUE: Multidetector CT imaging of the chest was performed during intravenous contrast administration. CONTRAST:  79mL OMNIPAQUE IOHEXOL 300 MG/ML  SOLN COMPARISON:  MR pelvis 04/07/2021. CT abdomen and pelvis 04/06/2021. FINDINGS: Cardiovascular: No significant vascular findings. Normal heart size. No pericardial effusion.  Mediastinum/Nodes: There are no enlarged mediastinal, hilar or axillary lymph nodes. Nonenlarged bilateral axillary lymph nodes are present. The visualized thyroid gland and esophagus are within normal limits. Lungs/Pleura: 222 there are atelectatic changes in both lung bases which have increased compared to the prior study. There is no pleural effusion or pneumothorax. Trachea and central airways are patent. Upper Abdomen: Spleen appears enlarged, unchanged. Musculoskeletal: No chest wall abnormality. No acute or significant osseous findings. IMPRESSION: 1. Bilateral lower lobe atelectatic changes have increased. Can not exclude infection in the right lung base. 2. No lymphadenopathy in the chest. 3. Splenomegaly. Electronically Signed   By: Ronney Asters M.D.   On: 04/09/2021 21:00   MR PELVIS W WO CONTRAST  Result Date: 04/07/2021 CLINICAL DATA:  Pelvic/adnexal masses EXAM: MRI PELVIS WITHOUT AND WITH CONTRAST TECHNIQUE: Multiplanar multisequence MR imaging of the pelvis  was performed both before and after administration of intravenous contrast. CONTRAST:  8.39mL GADAVIST GADOBUTROL 1 MMOL/ML IV SOLN COMPARISON:  CT scan 04/06/2021.  Pelvic ultrasound 04/02/2021 FINDINGS: Urinary Tract:  Urinary bladder unremarkable Bowel:  No abnormality observed Vascular/Lymphatic: Right external iliac node 1.0 cm in short axis on image 44 series 11. The right external iliac vein is somewhat narrowed by the adjacent ovary. Reproductive: 2 dominant low signal intensity uterine fibroids are identified, anterior fibroid 4.8 by 4.5 by 5.0 cm and the more posterior fibroid measuring 4.7 by 3.8 by 3.2 cm. This more posterior lower uterine segment fibroid has a submucosal component. The endometrium does not appear thickened. There is low T2 signal intensity throughout the myometrium and adenomyosis is not excluded. The right ovary measures about 5.0 by 2.4 by 4.0 cm, and appears to contain a 2.3 by 2.0 by 2.5 cm anterior cystic  lesion with only marginal enhancement. This lesion has mixed precontrast T2 signal but primarily high, and is likely a mildly complex cyst. The left ovary measures approximately 3.6 by 1.8 by 2.7 cm. In the cul-de-sac region and abutting both ovaries, we demonstrate a 11.2 by 8.8 by 9.7 cm cystic lesion with an internal fluid-fluid level which could reflect hemorrhagic or infected contents. This only has fairly thin marginal enhancement which would seemingly be unusual for infected lesion. There is some enhancing nodularity along the left anterior margin on image 39 of series 11 such that a small soft tissue nodule in this lesion is not excluded. Other: On T2 weighted images, there appears to be some retroperitoneal edema extending along the pelvic sidewalls, and also edema in the presacral space, as shown for example on series 6. Musculoskeletal: No bony abnormality is observed. IMPRESSION: 1. Large cystic lesion in the cul-de-sac has a fluid-fluid level and only mild marginal enhancement although does have some nodular enhancing components along the left anterior margin and to a lesser extent along the right anterior margin adjacent to the ovaries. Given that this is new compared to 02/01/2021, I am skeptical that it represents an ovarian malignancy. More likely this is some form of hemorrhagic cystic lesion or less likely abscess in the cul-de-sac region. A could be arising from either ovary based on the abutment of both ovaries. There is edema tracking around both ovaries and in the pelvic sidewalls which is nonspecific, along with nonspecific presacral edema. Presumably this could be related to inflammation in the region, and if the has fever then the possibility of a large tubo-ovarian abscess extending in the cul-de-sac should be considered. 2. Mildly complex cystic lesion but without internal nodular enhancement anteriorly in the right ovary, probably a benign complex cyst given the thin marginal  enhancement. 3. Notable uterine fibroids including a lower uterine segment subserosal fibroid. 4. Borderline right external iliac adenopathy, nonspecific. Electronically Signed   By: Van Clines M.D.   On: 04/07/2021 18:26   CT ABDOMEN PELVIS W CONTRAST  Result Date: 04/06/2021 CLINICAL DATA:  Nonlocalized abdominal pain.  Fever. EXAM: CT ABDOMEN AND PELVIS WITH CONTRAST TECHNIQUE: Multidetector CT imaging of the abdomen and pelvis was performed using the standard protocol following bolus administration of intravenous contrast. CONTRAST:  78mL OMNIPAQUE IOHEXOL 300 MG/ML  SOLN COMPARISON:  CT pelvis 02/01/2021 FINDINGS: Lower chest: Trace right pleural effusion. Bibasilar atelectasis. Retrocrural lymphadenopathy (3:23). Hepatobiliary: No focal liver abnormality. The gallbladder is contracted. No gallstones, gallbladder wall thickening, or pericholecystic fluid. No biliary dilatation. Pancreas: No focal lesion. Normal pancreatic contour. No surrounding  inflammatory changes. No main pancreatic ductal dilatation. Spleen: Enlarged spleen measuring up to 15 cm. Adrenals/Urinary Tract: No adrenal nodule bilaterally. Bilateral kidneys enhance symmetrically. No hydronephrosis. No hydroureter. The urinary bladder is unremarkable. Stomach/Bowel: Stomach is within normal limits. No evidence of bowel wall thickening or dilatation. Appendix appears normal. Vascular/Lymphatic: No abdominal aorta or iliac aneurysm. Mild atherosclerotic plaque of the aorta and its branches. Retroperitoneal lymphadenopathy and fat stranding: As an example 1.2 cm aortocaval lymph node (3:51). Stranding along the common iliacs and external iliacs suggestive of possible lymphoproliferative findings within the pelvis. No definite pelvic sidewall lymphadenopathy. No inguinal lymphadenopathy Reproductive: T-shaped intrauterine vice within in the uterus. Its position is unclear and may be malpositioned. The uterus is irregular in contour with  suggestion of uterine fibroids. Interval development of a 9 cm fluid density lesion that is likely arising from the uterus/cervix within the rectouterine sac and is inseparable from both the uterus/cervix as well as rectum. Bilateral adnexal regions are otherwise grossly unremarkable. Other: No intraperitoneal free fluid. No intraperitoneal free gas. No organized fluid collection. Musculoskeletal: No abdominal wall hernia or abnormality. No suspicious lytic or blastic osseous lesions. No acute displaced fracture. Multilevel degenerative changes of the spine. IMPRESSION: 1. Trace right pleural effusion. 2. Splenomegaly as well as retroperitoneal and retrocrural lymphadenopathy concerning for lymphoproliferative disorder/lymphoma. Recommend PET-CT further evaluation. 3. Interval development of a 9 cm fluid density lesion likely arising from the uterus/cervix. Lesion is found to be inseparable from both the uterus/cervix as well as rectum. Fibroid uterus is also noted. Recommend pelvic ultrasound further evaluation. 4. Unclear positioning of a T-shaped intrauterine device noted within the uterus. May be malpositioned/too low. Recommend pelvic ultrasound. 5.  Aortic Atherosclerosis (ICD10-I70.0). Electronically Signed   By: Iven Finn M.D.   On: 04/06/2021 20:12   US PELVIC COMPLETE W TRANSVAGINAL AND TORSION R/O  Result Date: 04/02/2021 CLINICAL DATA:  Lower pelvic pain EXAM: TRANSABDOMINAL AND TRANSVAGINAL ULTRASOUND OF PELVIS DOPPLER ULTRASOUND OF OVARIES TECHNIQUE: Both transabdominal and transvaginal ultrasound examinations of the pelvis were performed. Transabdominal technique was performed for global imaging of the pelvis including uterus, ovaries, adnexal regions, and pelvic cul-de-sac. It was necessary to proceed with endovaginal exam following the transabdominal exam to visualize the uterus endometrium adnexa. Color and duplex Doppler ultrasound was utilized to evaluate blood flow to the ovaries.  COMPARISON:  Pelvic CT 02/01/2021, pelvic ultrasound 10/04/2020 FINDINGS: Uterus Measurements: 12.2 x 9 x 5.7 cm = volume: 323 mL. Lobulated uterine contour on transabdominal images likely corresponds to history of fibroids but very limited assessment of the uterus on transvaginal images. Endometrium Thickness: 21.2 mm.  IUD poorly visible Right ovary Measurements: 4.2 x 3.2 x 2.9 cm = volume: 19.8 mL. Normal appearance/no adnexal mass. Left ovary Measurements: 3.1 x 1.8 x 1.9 cm = volume: 5.5 mL. Large left adnexal complex mass extending to the posterior cul-de-sac, this measures 11.4 x 7.7 x 6.2 cm. This contains internal fluid fluid level and peripheral areas of lobulated vascular appearing tissue. Pulsed Doppler evaluation of both ovaries demonstrates normal low-resistance arterial and venous waveforms. Other findings No abnormal free fluid. IMPRESSION: 1. Large complex cystic mass in the left adnexa extending to the posterior cul-de-sac measuring at least 11.4 cm and probably containing peripheral vascular soft tissue, raising concern for ovarian neoplasm. Gynecology consultation recommended with potential evaluation with MRI. 2. The ovaries are imaged as separate structures and demonstrate no definitive evidence for torsion 3. The uterus is poorly evaluated secondary to the large pelvic  mass. The IUD is not well visualized. 4. Endometrial thickness of 21.2 mm. Endometrial thickness is considered abnormal. Consider follow-up by [place] in 6-8 weeks, during the week immediately following menses (exam timing is critical). Electronically Signed   By: Donavan Foil M.D.   On: 04/02/2021 16:21   [place] EKG SITE RITE  Result Date: 04/10/2021 If Site Rite image not attached, placement could not be confirmed due to current cardiac rhythm.    Subjective:  She is still having pain. She feels good with going home today.   Discharge Exam: Vitals:   04/10/21 1942 04/11/21 0413  BP: 121/75 111/72  Pulse: 89 84  Resp:  16 17  Temp: 100 F (37.8 C) 99.1 F (37.3 C)  SpO2: 97% 98%     General: Pt is alert, awake, not in acute distress Cardiovascular: RRR, S1/S2 +, no rubs, no gallops Respiratory: CTA bilaterally, no wheezing, no rhonchi Abdominal: Soft, NT, ND, bowel sounds + Extremities: no edema, no cyanosis    The results of significant diagnostics from this hospitalization (including imaging, microbiology, ancillary and laboratory) are listed below for reference.     Microbiology: Recent Results (from the past 240 hour(s))  Wet prep, genital     Status: Abnormal   Collection Time: 04/02/21  7:51 PM   Specimen: PATH Cytology Cervicovaginal Ancillary Only  Result Value Ref Range Status   Yeast Wet Prep HPF POC NONE SEEN NONE SEEN Final   Trich, Wet Prep NONE SEEN NONE SEEN Final   Clue Cells Wet Prep HPF POC PRESENT (A) NONE SEEN Final   WBC, Wet Prep HPF POC <10 <10 Final    Comment: Please note change in reference range.   Sperm NONE SEEN  Final    Comment: Performed at [facility], [place] [address] [place], [place] [address]  Blood culture (routine x 2)     Status: None   Collection Time: 04/06/21  4:54 PM   Specimen: BLOOD  Result Value Ref Range Status   Specimen Description BLOOD RIGHT ANTECUBITAL  Final   Special Requests   Final    BOTTLES DRAWN AEROBIC AND ANAEROBIC Blood Culture adequate volume   Culture   Final    NO GROWTH 5 DAYS Performed at [facility], [place] [address] [place], [place] [address]    Report Status 04/11/2021 FINAL  Final  Resp Panel by RT-PCR (Flu A&B, Covid) Nasopharyngeal Swab     Status: None   Collection Time: 04/06/21  5:57 PM   Specimen: Nasopharyngeal Swab; Nasopharyngeal(NP) swabs in vial transport medium  Result Value Ref Range Status   SARS Coronavirus 2 by RT PCR NEGATIVE NEGATIVE Final    Comment: (NOTE) SARS-CoV-2 target nucleic acids are NOT DETECTED.  The SARS-CoV-2 RNA is generally detectable in upper  respiratory specimens during the acute phase of infection. The lowest concentration of SARS-CoV-2 viral copies this assay can detect is 138 copies/mL. A negative result does not preclude SARS-Cov-2 infection and should not be used as the sole basis for treatment or other patient management decisions. A negative result may occur with  improper specimen collection/handling, submission of specimen other than nasopharyngeal swab, presence of viral mutation(s) within the areas targeted by this assay, and inadequate number of viral copies(<138 copies/mL). A negative result must be combined with clinical observations, patient history, and epidemiological information. The expected result is Negative.  Fact Sheet for Patients:  EntrepreneurPulse.com.au  Fact Sheet for Healthcare Providers:  IncredibleEmployment.be  This test  is no t yet approved or cleared by the [place] and  has been authorized for detection and/or diagnosis of SARS-CoV-2 by FDA under an Emergency Use Authorization (EUA). This EUA will remain  in effect (meaning this test can be used) for the duration of the COVID-19 declaration under Section 564(b)(1) of the Act, 21 U.S.C.section 360bbb-3(b)(1), unless the authorization is terminated  or revoked sooner.       Influenza A by PCR NEGATIVE NEGATIVE Final   Influenza B by PCR NEGATIVE NEGATIVE Final    Comment: (NOTE) The Xpert Xpress SARS-CoV-2/FLU/RSV plus assay is intended as an aid in the diagnosis of influenza from Nasopharyngeal swab specimens and should not be used as a sole basis for treatment. Nasal washings and aspirates are unacceptable for Xpert Xpress SARS-CoV-2/FLU/RSV testing.  Fact Sheet for Patients: EntrepreneurPulse.com.au  Fact Sheet for Healthcare Providers: IncredibleEmployment.be  This test is not yet approved or cleared by the [place] FDA and has been  authorized for detection and/or diagnosis of SARS-CoV-2 by FDA under an Emergency Use Authorization (EUA). This EUA will remain in effect (meaning this test can be used) for the duration of the COVID-19 declaration under Section 564(b)(1) of the Act, 21 U.S.C. section 360bbb-3(b)(1), unless the authorization is terminated or revoked.  Performed at [facility], [place] [address], [place], [place] [address]   Blood culture (routine x 2)     Status: None   Collection Time: 04/06/21  6:58 PM   Specimen: BLOOD  Result Value Ref Range Status   Specimen Description BLOOD RIGHT ANTECUBITAL  Final   Special Requests   Final    BOTTLES DRAWN AEROBIC AND ANAEROBIC Blood Culture results may not be optimal due to an inadequate volume of blood received in culture bottles   Culture   Final    NO GROWTH 5 DAYS Performed at [facility], [place] [address] [place], [place] [address]    Report Status 04/11/2021 FINAL  Final     Labs: BNP (last 3 results) No results for input(s): BNP in the last 8760 hours. Basic Metabolic Panel: Recent Labs  Lab 04/07/21 0409 04/08/21 0122 04/09/21 0117 04/10/21 0117 04/11/21 0446  NA 133* 133* 132* 133* 133*  K 4.0 3.8 3.9 4.1 4.1  CL 99 99 99 99 100  CO2 $Re'28 27 26 25 26  'HOh$ GLUCOSE 94 97 99 105* 101*  BUN <5* <5* <5* <5* <5*  CREATININE 0.69 0.67 0.67 0.68 0.74  CALCIUM 8.2* 8.2* 8.4* 8.4* 8.6*  MG  --  2.1 2.3 2.2 2.1  PHOS  --  3.2 4.0 4.2 4.0   Liver Function Tests: Recent Labs  Lab 04/07/21 0409 04/08/21 0122 04/09/21 0117 04/10/21 0117 04/11/21 0446  AST $Re'24 24 25 'DzG$ 33 31  ALT 44 35 32 31 30  ALKPHOS 125 98 107 101 95  BILITOT 0.4 0.2* 0.4 0.4 0.3  PROT 7.1 7.0 7.3 7.5 7.9  ALBUMIN 2.4* 2.3* 2.3* 2.4* 2.5*   Recent Labs  Lab 04/06/21 1710  LIPASE 34   No results for input(s): AMMONIA in the last 168 hours. CBC: Recent Labs  Lab 04/07/21 0409 04/08/21 0122 04/09/21 0117 04/10/21 0117 04/11/21 0446  WBC 10.5 6.4 8.1 10.2  11.5*  NEUTROABS 8.1* 4.0 6.2 7.4 8.6*  HGB 8.5* 7.8* 8.3* 8.1* 8.0*  HCT 26.6* 24.6* 26.1* 25.2* 26.2*  MCV 89.9 91.4 91.6 90.6 93.2  PLT 200 220 258 267 290   Cardiac Enzymes: No results  for input(s): CKTOTAL, CKMB, CKMBINDEX, TROPONINI in the last 168 hours. BNP: Invalid input(s): POCBNP CBG: No results for input(s): GLUCAP in the last 168 hours. D-Dimer No results for input(s): DDIMER in the last 72 hours. Hgb A1c No results for input(s): HGBA1C in the last 72 hours. Lipid Profile No results for input(s): CHOL, HDL, LDLCALC, TRIG, CHOLHDL, LDLDIRECT in the last 72 hours. Thyroid function studies No results for input(s): TSH, T4TOTAL, T3FREE, THYROIDAB in the last 72 hours.  Invalid input(s): FREET3 Anemia work up No results for input(s): VITAMINB12, FOLATE, FERRITIN, TIBC, IRON, RETICCTPCT in the last 72 hours. Urinalysis    Component Value Date/Time   COLORURINE AMBER (A) 04/06/2021 1710   APPEARANCEUR HAZY (A) 04/06/2021 1710   LABSPEC 1.021 04/06/2021 1710   PHURINE 8.0 04/06/2021 1710   GLUCOSEU NEGATIVE 04/06/2021 1710   HGBUR SMALL (A) 04/06/2021 1710   BILIRUBINUR NEGATIVE 04/06/2021 1710   KETONESUR NEGATIVE 04/06/2021 1710   PROTEINUR 30 (A) 04/06/2021 1710   NITRITE NEGATIVE 04/06/2021 1710   LEUKOCYTESUR SMALL (A) 04/06/2021 1710   Sepsis Labs Invalid input(s): PROCALCITONIN,  WBC,  LACTICIDVEN Microbiology Recent Results (from the past 240 hour(s))  Wet prep, genital     Status: Abnormal   Collection Time: 04/02/21  7:51 PM   Specimen: PATH Cytology Cervicovaginal Ancillary Only  Result Value Ref Range Status   Yeast Wet Prep HPF POC NONE SEEN NONE SEEN Final   Trich, Wet Prep NONE SEEN NONE SEEN Final   Clue Cells Wet Prep HPF POC PRESENT (A) NONE SEEN Final   WBC, Wet Prep HPF POC <10 <10 Final    Comment: Please note change in reference range.   Sperm NONE SEEN  Final    Comment: Performed at [facility], [place] [address],  [place], [place] [address]  Blood culture (routine x 2)     Status: None   Collection Time: 04/06/21  4:54 PM   Specimen: BLOOD  Result Value Ref Range Status   Specimen Description BLOOD RIGHT ANTECUBITAL  Final   Special Requests   Final    BOTTLES DRAWN AEROBIC AND ANAEROBIC Blood Culture adequate volume   Culture   Final    NO GROWTH 5 DAYS Performed at [facility], [place] [address] [place], [place] [address]    Report Status 04/11/2021 FINAL  Final  Resp Panel by RT-PCR (Flu A&B, Covid) Nasopharyngeal Swab     Status: None   Collection Time: 04/06/21  5:57 PM   Specimen: Nasopharyngeal Swab; Nasopharyngeal(NP) swabs in vial transport medium  Result Value Ref Range Status   SARS Coronavirus 2 by RT PCR NEGATIVE NEGATIVE Final    Comment: (NOTE) SARS-CoV-2 target nucleic acids are NOT DETECTED.  The SARS-CoV-2 RNA is generally detectable in upper respiratory specimens during the acute phase of infection. The lowest concentration of SARS-CoV-2 viral copies this assay can detect is 138 copies/mL. A negative result does not preclude SARS-Cov-2 infection and should not be used as the sole basis for treatment or other patient management decisions. A negative result may occur with  improper specimen collection/handling, submission of specimen other than nasopharyngeal swab, presence of viral mutation(s) within the areas targeted by this assay, and inadequate number of viral copies(<138 copies/mL). A negative result must be combined with clinical observations, patient history, and epidemiological information. The expected result is Negative.  Fact Sheet for Patients:  EntrepreneurPulse.com.au  Fact Sheet for Healthcare Providers:  IncredibleEmployment.be  This test is no t  yet approved or cleared by the [place] and  has been authorized for detection and/or diagnosis of SARS-CoV-2 by FDA under an Emergency Use Authorization (EUA). This  EUA will remain  in effect (meaning this test can be used) for the duration of the COVID-19 declaration under Section 564(b)(1) of the Act, 21 U.S.C.section 360bbb-3(b)(1), unless the authorization is terminated  or revoked sooner.       Influenza A by PCR NEGATIVE NEGATIVE Final   Influenza B by PCR NEGATIVE NEGATIVE Final    Comment: (NOTE) The Xpert Xpress SARS-CoV-2/FLU/RSV plus assay is intended as an aid in the diagnosis of influenza from Nasopharyngeal swab specimens and should not be used as a sole basis for treatment. Nasal washings and aspirates are unacceptable for Xpert Xpress SARS-CoV-2/FLU/RSV testing.  Fact Sheet for Patients: EntrepreneurPulse.com.au  Fact Sheet for Healthcare Providers: IncredibleEmployment.be  This test is not yet approved or cleared by the [place] FDA and has been authorized for detection and/or diagnosis of SARS-CoV-2 by FDA under an Emergency Use Authorization (EUA). This EUA will remain in effect (meaning this test can be used) for the duration of the COVID-19 declaration under Section 564(b)(1) of the Act, 21 U.S.C. section 360bbb-3(b)(1), unless the authorization is terminated or revoked.  Performed at [facility], [place] [address], [place], [place] [address]   Blood culture (routine x 2)     Status: None   Collection Time: 04/06/21  6:58 PM   Specimen: BLOOD  Result Value Ref Range Status   Specimen Description BLOOD RIGHT ANTECUBITAL  Final   Special Requests   Final    BOTTLES DRAWN AEROBIC AND ANAEROBIC Blood Culture results may not be optimal due to an inadequate volume of blood received in culture bottles   Culture   Final    NO GROWTH 5 DAYS Performed at [facility], [place] [address], [place], [place] [address]    Report Status 04/11/2021 FINAL  Final     Time coordinating discharge: 40 minutes  SIGNED:   Elmarie Shiley, MD  [facility]

## 2021-04-11 NOTE — Progress Notes (Signed)
Mobility Specialist Progress Note:   04/11/21 1045  Mobility  Activity Ambulated in hall  Level of Assistance Independent  Assistive Device None  Distance Ambulated (ft) 600 ft  Mobility Ambulated independently in hallway  Mobility Response Tolerated well  Mobility performed by Mobility specialist  $Mobility charge 1 Mobility   Pt asx during ambulation.   Nelta Numbers Mobility Specialist  Phone [phone]

## 2021-04-12 LAB — EPSTEIN-BARR VIRUS (EBV) ANTIBODY PROFILE
EBV NA IgG: >600 U/mL — ABNORMAL HIGH (ref 0.0–17.9)
EBV VCA IgG: 164 U/mL — ABNORMAL HIGH (ref 0.0–17.9)
EBV VCA IgM: <36 U/mL (ref 0.0–35.9)

## 2021-04-12 LAB — HEPATITIS B SURFACE ANTIBODY, QUANTITATIVE: Hep B S AB Quant (Post): 12.3 m[IU]/mL (ref >9.9)

## 2021-04-13 LAB — CMV ANTIBODY, IGG (EIA): CMV Ab - IgG: 7.2 U/mL — ABNORMAL HIGH (ref 0.00–0.59)

## 2021-04-13 LAB — CMV IGM: CMV IgM: <30 AU/mL (ref 0.0–29.9)

## 2021-04-14 ENCOUNTER — Telehealth: Payer: Self-pay

## 2021-04-14 LAB — CYTOLOGY - PAP
Comment: NEGATIVE
Diagnosis: NEGATIVE
Diagnosis: REACTIVE
High risk HPV: NEGATIVE

## 2021-04-14 NOTE — Telephone Encounter (Signed)
Transition Care Management Follow-up Telephone Call Date of discharge and from where: 04/11/2021, [facility] How have you been since you were released from the hospital? She said she is doing okay.  Any questions or concerns? No  Items Reviewed: Did the pt receive and understand the discharge instructions provided? Yes  Medications obtained and verified? Yes  - she said she has all medications and did not have any questions about her med regime.  Other? No  Any new allergies since your discharge? No  Dietary orders reviewed? Yes Do you have support at home? Yes   Home Care and Equipment/Supplies: Were home health services ordered? yes If so, what is the name of the agency? [facility]  Has the agency set up a time to come to the patient's home? Yes - they are coming to see her today.  Were any new equipment or medical supplies ordered?  Yes: IV and PICC line supplies What is the name of the medical supply agency? [employer] Were you able to get the supplies/equipment? yes Do you have any questions related to the use of the equipment or supplies? No - the patient said that her mother is a Marine scientist and is administering the IV antibiotics.   Functional Questionnaire: (I = Independent and D = Dependent) ADLs: independent  Follow up appointments reviewed:  PCP Hospital f/u appt confirmed? Yes  Scheduled to see Juluis Mire, NP on 04/17/2021. [facility] f/u appt confirmed? Yes  Scheduled to see [facility]- 04/21/2021; GYN- 04/22/2021; GYN oncology - 04/23/2021.  Are transportation arrangements needed? No  If their condition worsens, is the pt aware to call PCP or go to the Emergency Dept.? Yes Was the patient provided with contact information for the PCP's office or ED? Yes Was to pt encouraged to call back with questions or concerns? Yes

## 2021-04-17 ENCOUNTER — Encounter (INDEPENDENT_AMBULATORY_CARE_PROVIDER_SITE_OTHER): Payer: Self-pay | Admitting: Primary Care

## 2021-04-17 ENCOUNTER — Other Ambulatory Visit: Payer: Self-pay

## 2021-04-17 ENCOUNTER — Ambulatory Visit (INDEPENDENT_AMBULATORY_CARE_PROVIDER_SITE_OTHER): Payer: PRIVATE HEALTH INSURANCE | Admitting: Primary Care

## 2021-04-17 VITALS — BP 117/77 | HR 91 | Temp 97.3°F | Ht 72.0 in | Wt 178.2 lb

## 2021-04-17 DIAGNOSIS — G4700 Insomnia, unspecified: Secondary | ICD-10-CM | POA: Diagnosis not present

## 2021-04-17 DIAGNOSIS — N888 Other specified noninflammatory disorders of cervix uteri: Secondary | ICD-10-CM

## 2021-04-17 DIAGNOSIS — F411 Generalized anxiety disorder: Secondary | ICD-10-CM | POA: Diagnosis not present

## 2021-04-17 DIAGNOSIS — Z09 Encounter for follow-up examination after completed treatment for conditions other than malignant neoplasm: Secondary | ICD-10-CM | POA: Diagnosis not present

## 2021-04-17 MED ORDER — BUSPIRONE HCL 5 MG PO TABS: 5.0000 mg | ORAL_TABLET | Freq: Two times a day (BID) | ORAL | 1 refills | Status: DC

## 2021-04-17 NOTE — Progress Notes (Signed)
[facility]   Subjective:   Caitlin Maddox is a 40 y.o. female presents for hospital follow up. Admit date to the hospital was 04/06/21, patient was discharged from the hospital on 04/11/21, patient was admitted for: Abnormal uterine bleed and followed by gynecology and schedule surgery. Initial visit was for f/u on Buspar effectiveness. She has notice improvement with medication. New problem waking up several times at during the night.  Past Medical History:  Diagnosis Date   Dysfunctional uterine bleeding    Symptomatic anemia      Allergies  Allergen Reactions   Clindamycin/Lincomycin Anaphylaxis and Swelling      Current Outpatient Medications on File Prior to Visit  Medication Sig Dispense Refill   busPIRone (BUSPAR) 5 MG tablet Take 1 tablet (5 mg total) by mouth 2 (two) times daily. 60 tablet 2   cefTRIAXone (ROCEPHIN) IVPB Inject 2 g into the vein daily for 14 days. Indication:  PID First Dose: No Last Day of Therapy:  04/25/2021 Labs - Once weekly:  CBC/D and BMP, Labs - Every other week:  ESR and CRP Method of administration: IV Push Method of administration may be changed at the discretion of home infusion pharmacist based upon assessment of the patient and/or caregiver's ability to self-administer the medication ordered. 15 Units 0   docusate sodium (COLACE) 250 MG capsule Take 250 mg by mouth daily.     ferrous sulfate (FERROUSUL) 325 (65 FE) MG tablet Take 1 tablet (325 mg total) by mouth 2 (two) times daily. 60 tablet 1   ibuprofen (ADVIL) 200 MG tablet Take 600 mg by mouth every 6 (six) hours as needed for mild pain, headache or cramping.     metroNIDAZOLE (FLAGYL) 500 MG tablet Take 1 tablet (500 mg total) by mouth every 12 (twelve) hours for 14 days. 28 tablet 0   polyethylene glycol (MIRALAX / GLYCOLAX) 17 g packet Take 17 g by mouth 2 (two) times daily. 14 each 0   vitamin B-12 (CYANOCOBALAMIN) 500 MCG tablet Take 1 tablet (500 mcg total) by mouth  daily. 30 tablet 1   No current facility-administered medications on file prior to visit.     Review of System: Review of Systems  Gastrointestinal:  Positive for abdominal pain.  Psychiatric/Behavioral:  The patient is nervous/anxious and has insomnia.   All other systems reviewed and are negative.  Objective:  BP 117/77 (BP Location: Left Arm, Patient Position: Sitting, Cuff Size: Small)   Pulse 91   Temp (!) 97.3 F (36.3 C) (Temporal)   Ht 6' (1.829 m)   Wt 178 lb 3.2 oz (80.8 kg)   LMP 04/17/2021 (Exact Date)   SpO2 100%   BMI 24.17 kg/m   Filed Weights   04/17/21 0954  Weight: 178 lb 3.2 oz (80.8 kg)    Physical Exam: General Appearance: Well nourished, in no apparent distress. Eyes: PERRLA, EOMs, conjunctiva no swelling or erythema Sinuses: No Frontal/maxillary tenderness ENT/Mouth: Ext aud canals clear, TMs without erythema, bulging. No erythema, swelling, or exudate on post pharynx.  Tonsils not swollen or erythematous. Hearing normal.  Neck: Supple, thyroid normal.  Respiratory: Respiratory effort normal, BS equal bilaterally without rales, rhonchi, wheezing or stridor.  Cardio: RRR with no MRGs. Brisk peripheral pulses without edema.  Abdomen: Soft, + BS.  Non tender, no guarding, rebound, hernias, masses. Lymphatics: Non tender without lymphadenopathy.  Musculoskeletal: Full ROM, 5/5 strength, normal gait.  Skin: Warm, dry without rashes, lesions, ecchymosis.  Neuro: Cranial nerves intact. Normal  muscle tone, no cerebellar symptoms. Sensation intact.  Psych: Awake and oriented X 3, normal affect, Insight and Judgment appropriate.    Assessment:  Caitlin Maddox was seen today for hospitalization follow-up.  Diagnoses and all orders for this visit:  Generalized anxiety disorder -     busPIRone (BUSPAR) 5 MG tablet; Take 1 tablet (5 mg total) by mouth 2 (two) times daily.  Mass of uterine cervix Followed by OB/GYN/scheduled surgical removal of a mass in  cervix.  Hospital discharge follow-up Retrieved from hospital discharge Follow up with [facility] Follow up with Kerin Perna, NP (Internal Medicine) in 1 week (04/18/2021) completed Follow up with Lafonda Mosses, MD (Gynecologic Oncology) in 2 weeks (04/25/2021) Follow up with Tommy Medal, Lavell Islam, MD (Infectious Diseases) in 2 weeks (04/25/2021)  Insomnia, unspecified type Insomnia may be secondary to upcoming scheduled surgery.  Information placed on AVS of different treatments that can be used for insomnia.  Will reevaluate if no improvement This note has been created with Surveyor, quantity. Any transcriptional errors are unintentional.   Kerin Perna, NP 04/17/2021, 10:19 AM

## 2021-04-17 NOTE — Patient Instructions (Signed)
Can try melatonin 5mg -15 mg at night for sleep, can also do benadryl 25-50mg  at night for sleep.  If this does not help we can try prescription medication.  Also here is some information about good sleep hygiene.   Insomnia Insomnia is frequent trouble falling and/or staying asleep. Insomnia can be a long term problem or a short term problem. Both are common. Insomnia can be a short term problem when the wakefulness is related to a certain stress or worry. Long term insomnia is often related to ongoing stress during waking hours and/or poor sleeping habits. Overtime, sleep deprivation itself can make the problem worse. Every little thing feels more severe because you are overtired and your ability to cope is decreased. CAUSES  Stress, anxiety, and depression. Poor sleeping habits. Distractions such as TV in the bedroom. Naps close to bedtime. Engaging in emotionally charged conversations before bed. Technical reading before sleep. Alcohol and other sedatives. They may make the problem worse. They can hurt normal sleep patterns and normal dream activity. Stimulants such as caffeine for several hours prior to bedtime. Pain syndromes and shortness of breath can cause insomnia. Exercise late at night. Changing time zones may cause sleeping problems (jet lag). It is sometimes helpful to have someone observe your sleeping patterns. They should look for periods of not breathing during the night (sleep apnea). They should also look to see how long those periods last. If you live alone or observers are uncertain, you can also be observed at a sleep clinic where your sleep patterns will be professionally monitored. Sleep apnea requires a checkup and treatment. Give your caregivers your medical history. Give your caregivers observations your family has made about your sleep.  SYMPTOMS  Not feeling rested in the morning. Anxiety and restlessness at bedtime. Difficulty falling and staying asleep. TREATMENT   Your caregiver may prescribe treatment for an underlying medical disorders. Your caregiver can give advice or help if you are using alcohol or other drugs for self-medication. Treatment of underlying problems will usually eliminate insomnia problems. Medications can be prescribed for short time use. They are generally not recommended for lengthy use. Over-the-counter sleep medicines are not recommended for lengthy use. They can be habit forming. You can promote easier sleeping by making lifestyle changes such as: Using relaxation techniques that help with breathing and reduce muscle tension. Exercising earlier in the day. Changing your diet and the time of your last meal. No night time snacks. Establish a regular time to go to bed. Counseling can help with stressful problems and worry. Soothing music and white noise may be helpful if there are background noises you cannot remove. Stop tedious detailed work at least one hour before bedtime. HOME CARE INSTRUCTIONS  Keep a diary. Inform your caregiver about your progress. This includes any medication side effects. See your caregiver regularly. Take note of: Times when you are asleep. Times when you are awake during the night. The quality of your sleep. How you feel the next day. This information will help your caregiver care for you. Get out of bed if you are still awake after 15 minutes. Read or do some quiet activity. Keep the lights down. Wait until you feel sleepy and go back to bed. Keep regular sleeping and waking hours. Avoid naps. Exercise regularly. Avoid distractions at bedtime. Distractions include watching television or engaging in any intense or detailed activity like attempting to balance the household checkbook. Develop a bedtime ritual. Keep a familiar routine of bathing, brushing your teeth,  climbing into bed at the same time each night, listening to soothing music. Routines increase the success of falling to sleep faster. Use  relaxation techniques. This can be using breathing and muscle tension release routines. It can also include visualizing peaceful scenes. You can also help control troubling or intruding thoughts by keeping your mind occupied with boring or repetitive thoughts like the old concept of counting sheep. You can make it more creative like imagining planting one beautiful flower after another in your backyard garden. During your day, work to eliminate stress. When this is not possible use some of the previous suggestions to help reduce the anxiety that accompanies stressful situations. MAKE SURE YOU:  Understand these instructions. Will watch your condition. Will get help right away if you are not doing well or get worse. Document Released: 04/24/2000 Document Revised: 07/20/2011 Document Reviewed: 05/25/2007 [facility] Patient Information 2015 [place], [place]. This information is not intended to replace advice given to you by your health care provider. Make sure you discuss any questions you have with your health care provider.

## 2021-04-17 NOTE — Progress Notes (Signed)
Pt would like to discuss medication

## 2021-04-18 ENCOUNTER — Other Ambulatory Visit: Payer: Self-pay | Admitting: Gynecologic Oncology

## 2021-04-18 ENCOUNTER — Telehealth: Payer: Self-pay | Admitting: *Deleted

## 2021-04-18 ENCOUNTER — Encounter: Payer: Self-pay | Admitting: Gynecologic Oncology

## 2021-04-18 DIAGNOSIS — R1900 Intra-abdominal and pelvic swelling, mass and lump, unspecified site: Secondary | ICD-10-CM

## 2021-04-18 NOTE — Telephone Encounter (Signed)
Per Dr Berline Lopes rescheduled appt from 12/14 to 12/12

## 2021-04-21 ENCOUNTER — Telehealth (INDEPENDENT_AMBULATORY_CARE_PROVIDER_SITE_OTHER): Payer: Self-pay | Admitting: *Deleted

## 2021-04-21 ENCOUNTER — Encounter: Payer: Self-pay | Admitting: Infectious Disease

## 2021-04-21 ENCOUNTER — Telehealth: Payer: Self-pay

## 2021-04-21 ENCOUNTER — Inpatient Hospital Stay: Payer: PRIVATE HEALTH INSURANCE | Attending: Gynecologic Oncology | Admitting: Gynecologic Oncology

## 2021-04-21 ENCOUNTER — Other Ambulatory Visit: Payer: Self-pay

## 2021-04-21 ENCOUNTER — Encounter: Payer: Self-pay | Admitting: Gynecologic Oncology

## 2021-04-21 ENCOUNTER — Ambulatory Visit (INDEPENDENT_AMBULATORY_CARE_PROVIDER_SITE_OTHER): Payer: PRIVATE HEALTH INSURANCE | Admitting: Infectious Disease

## 2021-04-21 ENCOUNTER — Inpatient Hospital Stay: Payer: PRIVATE HEALTH INSURANCE

## 2021-04-21 VITALS — BP 117/78 | HR 81 | Temp 97.3°F | Wt 178.0 lb

## 2021-04-21 VITALS — BP 117/90 | HR 93 | Temp 98.3°F | Resp 16 | Ht 72.0 in | Wt 178.0 lb

## 2021-04-21 DIAGNOSIS — R599 Enlarged lymph nodes, unspecified: Secondary | ICD-10-CM | POA: Insufficient documentation

## 2021-04-21 DIAGNOSIS — R109 Unspecified abdominal pain: Secondary | ICD-10-CM | POA: Insufficient documentation

## 2021-04-21 DIAGNOSIS — N939 Abnormal uterine and vaginal bleeding, unspecified: Secondary | ICD-10-CM | POA: Diagnosis not present

## 2021-04-21 DIAGNOSIS — R1900 Intra-abdominal and pelvic swelling, mass and lump, unspecified site: Secondary | ICD-10-CM | POA: Insufficient documentation

## 2021-04-21 DIAGNOSIS — R161 Splenomegaly, not elsewhere classified: Secondary | ICD-10-CM

## 2021-04-21 DIAGNOSIS — N888 Other specified noninflammatory disorders of cervix uteri: Secondary | ICD-10-CM

## 2021-04-21 DIAGNOSIS — Z87891 Personal history of nicotine dependence: Secondary | ICD-10-CM | POA: Diagnosis not present

## 2021-04-21 DIAGNOSIS — R102 Pelvic and perineal pain: Secondary | ICD-10-CM | POA: Insufficient documentation

## 2021-04-21 LAB — CMP (CANCER CENTER ONLY)
ALT: 9 U/L (ref 0–44)
AST: 17 U/L (ref 15–41)
Albumin: 3.3 g/dL — ABNORMAL LOW (ref 3.5–5.0)
Alkaline Phosphatase: 61 U/L (ref 38–126)
Anion gap: 10 (ref 5–15)
BUN: 6 mg/dL (ref 6–20)
CO2: 29 mmol/L (ref 22–32)
Calcium: 9.6 mg/dL (ref 8.9–10.3)
Chloride: 99 mmol/L (ref 98–111)
Creatinine: 0.76 mg/dL (ref 0.44–1.00)
GFR, Estimated: >60 mL/min (ref >60)
Glucose, Bld: 85 mg/dL (ref 70–99)
Potassium: 4.2 mmol/L (ref 3.5–5.1)
Sodium: 138 mmol/L (ref 135–145)
Total Bilirubin: <0.2 mg/dL — ABNORMAL LOW (ref 0.3–1.2)
Total Protein: 9.9 g/dL — ABNORMAL HIGH (ref 6.5–8.1)

## 2021-04-21 LAB — CBC (CANCER CENTER ONLY)
HCT: 31.6 % — ABNORMAL LOW (ref 36.0–46.0)
Hemoglobin: 9.9 g/dL — ABNORMAL LOW (ref 12.0–15.0)
MCH: 28.2 pg (ref 26.0–34.0)
MCHC: 31.3 g/dL (ref 30.0–36.0)
MCV: 90 fL (ref 80.0–100.0)
Platelet Count: 512 10*3/uL — ABNORMAL HIGH (ref 150–400)
RBC: 3.51 MIL/uL — ABNORMAL LOW (ref 3.87–5.11)
RDW: 17.5 % — ABNORMAL HIGH (ref 11.5–15.5)
WBC Count: 8.2 10*3/uL (ref 4.0–10.5)
nRBC: 0 % (ref 0.0–0.2)

## 2021-04-21 MED ORDER — TRAMADOL HCL 50 MG PO TABS: 50.0000 mg | ORAL_TABLET | Freq: Four times a day (QID) | ORAL | 0 refills | Status: DC | PRN

## 2021-04-21 MED ORDER — CEFDINIR 300 MG PO CAPS: 300.0000 mg | ORAL_CAPSULE | Freq: Two times a day (BID) | ORAL | 0 refills | Status: AC

## 2021-04-21 MED ORDER — METRONIDAZOLE 500 MG PO TABS: 500.0000 mg | ORAL_TABLET | Freq: Two times a day (BID) | ORAL | 0 refills | Status: DC

## 2021-04-21 NOTE — Progress Notes (Signed)
Gynecologic Oncology Return Clinic Visit  04/21/21  Reason for Visit: follow-up recent hospitalization  Treatment History: 6-month history of abnormal uterine bleeding 04/06/21: Presented to the emergency department with pelvic pain and fever/chills.  Endorsed decreased appetite with weight loss.  Found to have mild leukocytosis.  Imaging notable for cystic mass within the cul-de-sac that was new since September, retroperitoneal adenopathy, splenomegaly, and possible patchy lung opacities. 11/27: IUD removed. 11/30: Pap negative, HR HPV negative. EMB shows fragments of proliferative endometrium, no hyperplasia or malignancy. 04/11/21: Patient discharged on IV antibiotics for presumed treatment of PID.  Interval History: Patient presents today for follow-up after recent hospitalization.  She continues to have low pelvic pain, mostly on the left.  She describes this as constant and stabbing.  Sometimes with radiation to her back.  She continues to struggle with fatigue.  She is continued on IV antibiotics and saw infectious disease today with plan for another week of antibiotics and then likely transition to oral antibiotics.  She notes regular bowel function now with the use of MiraLAX.  Denies any bladder symptoms.  Appetite is still decreased but she has been doing much better in terms of eating.  She is using nutritional shakes.  She has had some light spotting since hospital discharge that we will stop for 2 days and then return.  She denies any fevers or chills.  Past Medical/Surgical History: Past Medical History:  Diagnosis Date   Dysfunctional uterine bleeding    Pelvic mass in female    Retroperitoneal lymphadenopathy    Symptomatic anemia     Past Surgical History:  Procedure Laterality Date   CESAREAN SECTION     CESAREAN SECTION      Family History  Problem Relation Age of Onset   Hypertension Mother     Social History   Socioeconomic History   Marital status: Single     Spouse name: Not on file   Number of children: Not on file   Years of education: Not on file   Highest education level: Not on file  Occupational History   Occupation: CNA  Tobacco Use   Smoking status: Former    Packs/day: 1.00    Years: 33.00    Pack years: 33.00    Types: Cigarettes    Quit date: 10/09/2020    Years since quitting: 0.5   Smokeless tobacco: Never  Substance and Sexual Activity   Alcohol use: No   Drug use: No   Sexual activity: Not on file  Other Topics Concern   Not on file  Social History Narrative   Not on file   Social Determinants of Health   Financial Resource Strain: Not on file  Food Insecurity: Not on file  Transportation Needs: Not on file  Physical Activity: Not on file  Stress: Not on file  Social Connections: Not on file    Current Medications:  Current Outpatient Medications:    acetaminophen (TYLENOL) 500 MG tablet, Take 500 mg by mouth every 8 (eight) hours as needed for moderate pain., Disp: , Rfl:    busPIRone (BUSPAR) 5 MG tablet, Take 1 tablet (5 mg total) by mouth 2 (two) times daily., Disp: 180 tablet, Rfl: 1   cefdinir (OMNICEF) 300 MG capsule, Take 1 capsule (300 mg total) by mouth 2 (two) times daily for 21 days., Disp: 42 capsule, Rfl: 0   cefTRIAXone (ROCEPHIN) IVPB, Inject 2 g into the vein daily for 14 days. Indication:  PID First Dose: No Last Day  of Therapy:  04/25/2021 Labs - Once weekly:  CBC/D and BMP, Labs - Every other week:  ESR and CRP Method of administration: IV Push Method of administration may be changed at the discretion of home infusion pharmacist based upon assessment of the patient and/or caregiver's ability to self-administer the medication ordered., Disp: 15 Units, Rfl: 0   docusate sodium (COLACE) 100 MG capsule, Take 100 mg by mouth 2 (two) times daily., Disp: , Rfl:    ferrous sulfate (FERROUSUL) 325 (65 FE) MG tablet, Take 1 tablet (325 mg total) by mouth 2 (two) times daily., Disp: 60 tablet, Rfl: 1    ibuprofen (ADVIL) 200 MG tablet, Take 600 mg by mouth every 6 (six) hours as needed for mild pain, headache or cramping., Disp: , Rfl:    metroNIDAZOLE (FLAGYL) 500 MG tablet, Take 1 tablet (500 mg total) by mouth every 12 (twelve) hours for 21 days., Disp: 42 tablet, Rfl: 0   polyethylene glycol (MIRALAX / GLYCOLAX) 17 g packet, Take 17 g by mouth 2 (two) times daily., Disp: 14 each, Rfl: 0   traMADol (ULTRAM) 50 MG tablet, Take 1 tablet (50 mg total) by mouth every 6 (six) hours as needed for severe pain. Do not take and drive, Disp: 25 tablet, Rfl: 0   vitamin B-12 (CYANOCOBALAMIN) 500 MCG tablet, Take 1 tablet (500 mcg total) by mouth daily., Disp: 30 tablet, Rfl: 1  Review of Systems: Pertinent positives as per HPI.  Include abdominal and back pain. Denies appetite changes, fevers, chills, fatigue, unexplained weight changes. Denies hearing loss, neck lumps or masses, mouth sores, ringing in ears or voice changes. Denies cough or wheezing.  Denies shortness of breath. Denies chest pain or palpitations. Denies leg swelling. Denies abdominal distention, blood in stools, constipation, diarrhea, nausea, vomiting, or early satiety. Denies pain with intercourse, dysuria, frequency, hematuria or incontinence. Denies hot flashes, pelvic pain, or vaginal discharge.   Denies joint pain or muscle pain/cramps. Denies itching, rash, or wounds. Denies dizziness, headaches, numbness or seizures. Denies swollen lymph nodes or glands, denies easy bruising or bleeding. Denies anxiety, depression, confusion, or decreased concentration.  Physical Exam: BP 117/90 (BP Location: Left Arm, Patient Position: Sitting)   Pulse 93   Temp 98.3 F (36.8 C) (Oral)   Resp 16   Ht 6' (1.829 m)   Wt 178 lb (80.7 kg)   LMP 04/17/2021 (Exact Date)   SpO2 100%   BMI 24.14 kg/m  General: Alert, oriented, no acute distress. HEENT: Normocephalic, atraumatic, sclera anicteric. Chest: Clear to auscultation  bilaterally.  No wheezes or rhonchi. Cardiovascular: Regular rate and rhythm, no murmurs. Abdomen: soft, moderately tender to palpation, especially in lower quadrants on abdominal exam.  Normoactive bowel sounds.  No masses or hepatosplenomegaly appreciated.   Extremities: Grossly normal range of motion.  Warm, well perfused.  No edema bilaterally. Skin: No rashes or lesions noted. Lymphatics: No cervical, supraclavicular, or inguinal adenopathy. GU: Normal appearing external genitalia without erythema, excoriation, or lesions.  Speculum exam well-tolerated, minimal blood within the vault, cervix normal appearing.  On bimanual exam, cervix and uterus deviated anteriorly, there is smooth fullness within the cul-de-sac.  On rectovaginal exam.  Both the rectum and cervix feel tethered to this mass which is smooth.  Laboratory & Radiologic Studies: Pelvic ultrasound 11/23: Complex cystic mass in the left adnexa extending to the posterior cul-de-sac measuring at least 11.4 cm and probably contain peripheral vascular soft tissue.  Uterus poorly evaluated, endometrial thickness measures 21.2 mm.  CT A/P 11/27: Trace right pleural effusion.  Splenomegaly as well as retroperitoneal and retrocrural adenopathy concerning for lymphoproliferative disorder or lymphoma.  Interval development of 9 cm fluid density lesion likely arising from the uterus/cervix.  Lesion inseparable from both uterus/cervix as well as the rectum.  MRI pelvis 11/28: Large cystic lesion in the cul-de-sac with a fluid fluid level and only mild marginal enhancement although does have some nodular enhancing components along the left anterior margin and to a lesser extent along the right anterior margin adjacent to the ovaries.  Thought most likely to represent hemorrhagic cystic lesion or less likely an abscess.  Small mildly complex cystic lesion but without internal nodular enhancement anteriorly in the right ovary, probably a benign  complex cyst.  Uterine fibroids noted.  Borderline right external iliac adenopathy, nonspecific.  CT chest 11/30: Bilateral lower lobe atelectatic changes have increased.  Cannot exclude infection of the right lung base.  No adenopathy in the chest.  Assessment & Plan: Caitlin Maddox is a 40 y.o. woman with cystic cul de sac mass, retroperitoneal adenopathy, and splenomegaly presenting for follow-up after recent hospitalization.  Patient has improved significantly since I last saw her with the exception of abdominal pain. Saw ID today with plan for continued abx and then transitioning to oral antibiotics in a week.  Reviewed prior imaging and my recommendation to repeat a pelvic ultrasound as well as to get a PET scan. It is still unclear to me if all intra-abdominal findings from imaging during recent hospitalization are related. Mass in her pelvic feels fixed to the cervix and rectum, but smooth. Given its quick interval growth from prior imaging, I still favor hemorrhagic lesion or infection over cancer. If imaging is more reassuring and PET is negative for FDG avidity of this pelvic mass, we may reconsider use of percutaneous drain instead of surgical excision for diagnostic and therapeutic purposes.   If surgery is not pursued from a gynecologic/pelvic mass standpoint (and instead treated with percutaneous drainage), I don't know that I am the best person to take the patient for a lymph node biopsy if still not amenable for percutaneous biopsy due to small size.   I reviewed with her again that surgical management is almost NEVER indicated in the setting of PID. Patients can become quite sick and even unstable if they undergo surgery in this setting. If we do pursue surgery, I will not proceed with excision of the pelvic mass if it looks like there is or has been recent infection.   The patient would like to come back closer to possible surgery date, after both imaging studies have been performed.  A prescription for tramadol was sent to her pharmacy.  42 minutes of total time was spent for this patient encounter, including preparation, face-to-face counseling with the patient and coordination of care, and documentation of the encounter.  Jeral Pinch, MD  Division of Gynecologic Oncology  Department of Obstetrics and Gynecology  [facility]

## 2021-04-21 NOTE — Telephone Encounter (Signed)
I have sent a message to [facility] and her team with [facility] to give orders. Per Dr. Tommy Medal last dose of IV antibiotics is to be on 04/25/21 and picc can be removed after last dose.  Melvinia Ashby T Brooks Sailors

## 2021-04-21 NOTE — Telephone Encounter (Signed)
Transition Care Management Follow-up Telephone Call Date of discharge and from where: 04/11/2021   [facility] How have you been since you were released from the hospital? "Fine" Any questions or concerns? No  Items Reviewed: Did the pt receive and understand the discharge instructions provided? Yes  Medications obtained and verified? Yes  Other? No  Any new allergies since your discharge? No  Dietary orders reviewed? Yes Do you have support at home? Yes   Home Care and Equipment/Supplies: Were home health services ordered? no If so, what is the name of the agency? N/a  Has the agency set up a time to come to the patient's home? not applicable Were any new equipment or medical supplies ordered?  No What is the name of the medical supply agency? N/a Were you able to get the supplies/equipment? not applicable Do you have any questions related to the use of the equipment or supplies? N/a  Functional Questionnaire: (I = Independent and D = Dependent) ADLs: I  Bathing/Dressing- I  Meal Prep- I  Eating- I  Maintaining continence- I  Transferring/Ambulation- I  Managing Meds- I  Follow up appointments reviewed:  PCP Hospital f/u appt confirmed? Yes  Scheduled to see primary care on 04/17/21  (pt did attend this appointment). [facility] f/u appt confirmed? Yes  Scheduled to see infectious disease on 04/21/21  pt did attend this appointment and saw oncologist today. Are transportation arrangements needed? No  If their condition worsens, is the pt aware to call PCP or go to the Emergency Dept.? Yes Was the patient provided with contact information for the PCP's office or ED? Yes Was to pt encouraged to call back with questions or concerns? Yes   Jacqlyn Larsen [facility], BSN RN Case Manager [phone]

## 2021-04-21 NOTE — Progress Notes (Signed)
Chief omplaint still having abdominal pain and distention Subjective:    Patient ID: Caitlin Maddox, female    DOB: [birth year], 40 y.o.   MRN: [id number]  HPI  40 y.o. female with history of fibroids and uterine bleeding IUD generalizing anxiety disorder admitted with severe abdominal pain that was evaluated previously and has continued to have a large complex cystic mass in the cul-de-sac with fluid-filled levels and nodular components as well as a mild complex cystic lesion on the right ovary, splenomegaly and lymphadenopathy.   When she was an inpatient differential for her presentation was including infection but also malignancy.  She is scheduled for definitive surgery with obstetrics and gynecology at the end of December.  In the interim we were asked to help direct antibiotics for her.  We went with empiric therapy with ceftriaxone 2 g IV daily along with metronidazole twice daily.  She does not seem terribly improved in terms of her abdominal pain though does look better to me than when I saw her in the hospital.     Past Medical History:  Diagnosis Date   Dysfunctional uterine bleeding    Pelvic mass in female    Retroperitoneal lymphadenopathy    Symptomatic anemia     Past Surgical History:  Procedure Laterality Date   CESAREAN SECTION     CESAREAN SECTION      Family History  Problem Relation Age of Onset   Hypertension Mother       Social History   Socioeconomic History   Marital status: Single    Spouse name: Not on file   Number of children: Not on file   Years of education: Not on file   Highest education level: Not on file  Occupational History   Occupation: CNA  Tobacco Use   Smoking status: Former    Packs/day: 1.00    Years: 33.00    Pack years: 33.00    Types: Cigarettes    Quit date: 10/09/2020    Years since quitting: 0.5   Smokeless tobacco: Never  Substance and Sexual Activity   Alcohol use: No   Drug use: No   Sexual activity: Not  on file  Other Topics Concern   Not on file  Social History Narrative   Not on file   Social Determinants of Health   Financial Resource Strain: Not on file  Food Insecurity: Not on file  Transportation Needs: Not on file  Physical Activity: Not on file  Stress: Not on file  Social Connections: Not on file    Allergies  Allergen Reactions   Clindamycin/Lincomycin Anaphylaxis and Swelling     Current Outpatient Medications:    acetaminophen (TYLENOL) 500 MG tablet, Take 500 mg by mouth every 8 (eight) hours as needed for moderate pain., Disp: , Rfl:    busPIRone (BUSPAR) 5 MG tablet, Take 1 tablet (5 mg total) by mouth 2 (two) times daily., Disp: 180 tablet, Rfl: 1   cefTRIAXone (ROCEPHIN) IVPB, Inject 2 g into the vein daily for 14 days. Indication:  PID First Dose: No Last Day of Therapy:  04/25/2021 Labs - Once weekly:  CBC/D and BMP, Labs - Every other week:  ESR and CRP Method of administration: IV Push Method of administration may be changed at the discretion of home infusion pharmacist based upon assessment of the patient and/or caregiver's ability to self-administer the medication ordered., Disp: 15 Units, Rfl: 0   docusate sodium (COLACE) 100 MG capsule, Take 100 mg by mouth 2 (  two) times daily., Disp: , Rfl:    ferrous sulfate (FERROUSUL) 325 (65 FE) MG tablet, Take 1 tablet (325 mg total) by mouth 2 (two) times daily., Disp: 60 tablet, Rfl: 1   ibuprofen (ADVIL) 200 MG tablet, Take 600 mg by mouth every 6 (six) hours as needed for mild pain, headache or cramping., Disp: , Rfl:    metroNIDAZOLE (FLAGYL) 500 MG tablet, Take 1 tablet (500 mg total) by mouth every 12 (twelve) hours for 14 days., Disp: 28 tablet, Rfl: 0   polyethylene glycol (MIRALAX / GLYCOLAX) 17 g packet, Take 17 g by mouth 2 (two) times daily., Disp: 14 each, Rfl: 0   vitamin B-12 (CYANOCOBALAMIN) 500 MCG tablet, Take 1 tablet (500 mcg total) by mouth daily., Disp: 30 tablet, Rfl: 1   Review of Systems   Constitutional:  Negative for activity change, appetite change, chills, diaphoresis, fatigue, fever and unexpected weight change.  HENT:  Negative for congestion, rhinorrhea, sinus pressure, sneezing, sore throat and trouble swallowing.   Eyes:  Negative for photophobia and visual disturbance.  Respiratory:  Negative for cough, chest tightness, shortness of breath, wheezing and stridor.   Cardiovascular:  Negative for chest pain, palpitations and leg swelling.  Gastrointestinal:  Positive for abdominal distention and abdominal pain. Negative for anal bleeding, blood in stool, constipation, diarrhea, nausea and vomiting.  Genitourinary:  Negative for difficulty urinating, dysuria, flank pain and hematuria.  Musculoskeletal:  Negative for arthralgias, back pain, gait problem, joint swelling and myalgias.  Skin:  Negative for color change, pallor, rash and wound.  Neurological:  Negative for dizziness, tremors, weakness and light-headedness.  Hematological:  Negative for adenopathy. Does not bruise/bleed easily.  Psychiatric/Behavioral:  Negative for agitation, behavioral problems, confusion, decreased concentration, dysphoric mood and sleep disturbance.       Objective:   Physical Exam Constitutional:      General: She is not in acute distress.    Appearance: Normal appearance. She is well-developed. She is not ill-appearing or diaphoretic.  HENT:     Head: Normocephalic and atraumatic.     Right Ear: Hearing and external ear normal.     Left Ear: Hearing and external ear normal.     Nose: No nasal deformity or rhinorrhea.  Eyes:     General: No scleral icterus.    Conjunctiva/sclera: Conjunctivae normal.     Right eye: Right conjunctiva is not injected.     Left eye: Left conjunctiva is not injected.     Pupils: Pupils are equal, round, and reactive to light.  Neck:     Vascular: No JVD.  Cardiovascular:     Rate and Rhythm: Normal rate and regular rhythm.     Heart sounds: Normal  heart sounds, S1 normal and S2 normal. No murmur heard.   No friction rub. No gallop.  Pulmonary:     Effort: Pulmonary effort is normal. No respiratory distress.     Breath sounds: No stridor. No rhonchi or rales.  Abdominal:     General: Bowel sounds are normal. There is distension.     Palpations: Abdomen is soft.     Tenderness: There is abdominal tenderness.  Musculoskeletal:        General: Normal range of motion.     Right shoulder: Normal.     Left shoulder: Normal.     Cervical back: Normal range of motion and neck supple.     Right hip: Normal.     Left hip: Normal.  Right knee: Normal.     Left knee: Normal.  Lymphadenopathy:     Head:     Right side of head: No submandibular, preauricular or posterior auricular adenopathy.     Left side of head: No submandibular, preauricular or posterior auricular adenopathy.     Cervical: No cervical adenopathy.     Right cervical: No superficial or deep cervical adenopathy.    Left cervical: No superficial or deep cervical adenopathy.  Skin:    General: Skin is warm and dry.     Coloration: Skin is not pale.     Findings: No abrasion, bruising, ecchymosis, erythema, lesion or rash.     Nails: There is no clubbing.  Neurological:     General: No focal deficit present.     Mental Status: She is alert and oriented to person, place, and time.     Sensory: No sensory deficit.     Coordination: Coordination normal.     Gait: Gait normal.  Psychiatric:        Attention and Perception: She is attentive.        Mood and Affect: Mood normal.        Speech: Speech normal.        Behavior: Behavior normal. Behavior is cooperative.        Thought Content: Thought content normal.        Judgment: Judgment normal.    PICC site 04/21/2021:         Assessment & Plan:   Pelvic cystic mass with fluid-filled components and nodular aspects near the cul-de-sac as well as another complex cystic lesion in the right ovary:  We have  been treating presumptively to cover infection in this area with ceftriaxone and metronidazole.  I will continue ceftriaxone and metronidazole till her stop date which I believe is April 25, 2021.  After she is completed IV antibiotics I want her to go on to cefdinir twice daily along with continued metronidazole twice daily.  I have sent prescriptions for both these medications to the hospital.  When she does undergo surgery in the hospital I would like for her team to reach out to our on-call ID physician so that we can see her in the hospital and see if she needs further treatment from our team with infectious disease.  I do have concerns that she may have a malignancy here.   Splenomegaly and lymph and adenopathy in the abdomen:  With suspected related to the pelvic mass and again I have concerned about malignancy. In reviewing her labs from the hospital which showed ordered I note that her LDH was negative.  Her HIV antibody and RNA were negative her Epstein-Barr and CMV serologies were consistent with past infection.  I spent 40  minutes with the patient including than 50% of the time in face to face counseling of the patient regarding work-up of her cystic pelvic masses, lymphadenopathy splenomegaly, personally reviewing imaging including CT and MRI performed during her hospitalization in November culture data CBC CMP along with personally reviewing along with review of medical records in preparation for the visit and during the visit and in coordination of her care.

## 2021-04-21 NOTE — Patient Instructions (Addendum)
It was good to see you today.  I will call you once I get your ultrasound and PET scan results back.  We will have you come in for a visit after to discuss details of surgery if that is the neck step we are can take.  As we discussed today, depending on the findings of the scans, I may not recommend surgery for the mass in your pelvis.  It feels smooth on your exam today, but it feels stuck to your cervix and your rectum.

## 2021-04-22 ENCOUNTER — Encounter: Payer: PRIVATE HEALTH INSURANCE | Admitting: Student

## 2021-04-22 NOTE — Patient Instructions (Addendum)
DUE TO COVID-19 ONLY ONE VISITOR IS ALLOWED TO COME WITH YOU AND STAY IN THE WAITING ROOM ONLY DURING PRE OP AND PROCEDURE DAY OF SURGERY IF YOU ARE GOING HOME AFTER SURGERY. IF YOU ARE SPENDING THE NIGHT 2 PEOPLE MAY VISIT WITH YOU IN YOUR PRIVATE ROOM AFTER SURGERY UNTIL VISITING  HOURS ARE OVER AT 800 PM AND 1  VISITOR  MAY  SPEND THE NIGHT.                  Caitlin Maddox     Your procedure is scheduled on: 05/06/21   Report to [facility] Main  Entrance   Report to admitting at  8:45 AM     Call this number if you have problems the morning of surgery [phone]   No solid food after midnight 12 /25/22  Follow the Dr. 's instructions for diet before surgery.  Light diet and no carbonated drinks the day before surgery  DRINK 2 PRESURGERY ENSURE DRINKS THE NIGHT BEFORE SURGERY AT 10:00 PM    NOTHING BY MOUTH EXCEPT CLEAR LIQUIDS UNTIL 8:00 am   PLEASE FINISH PRESURGERY ENSURE DRINK PER SURGEON ORDER 3 HOURS PRIOR TO SCHEDULED SURGERY TIME WHICH NEEDS TO BE COMPLETED AT _8:00 am________.   CLEAR LIQUID DIET   Foods Allowed                                                                     Foods Excluded  Coffee and tea, regular and decaf                             liquids that you cannot  Plain Jell-O any favor except red or purple                                           see through such as: Fruit ices (not with fruit pulp)                                     milk, soups, orange juice  Iced Popsicles                                    All solid food Carbonated beverages, regular and diet                                    Cranberry, grape and apple juices Sports drinks like Gatorade Lightly seasoned clear broth or consume(fat free) Sugar    BRUSH YOUR TEETH MORNING OF SURGERY AND RINSE YOUR MOUTH OUT, NO CHEWING GUM CANDY OR MINTS.     Take these medicines the morning of surgery with A SIP OF WATER: Buspirone  You may not  have any metal on your body including hair pins and              piercings  Do not wear jewelry, make-up, lotions, powders or perfumes, deodorant             Do not wear nail polish on your fingernails.  Do not shave  48 hours prior to surgery.            .   Do not bring valuables to the hospital. [place].  Contacts, dentures or bridgework may not be worn into surgery.     Patients discharged the day of surgery will not be allowed to drive home.  IF YOU ARE HAVING SURGERY AND GOING HOME THE SAME DAY, YOU MUST HAVE AN ADULT TO DRIVE YOU HOME AND BE WITH YOU FOR 24 HOURS. YOU MAY GO HOME BY TAXI OR UBER OR ORTHERWISE, BUT AN ADULT MUST ACCOMPANY YOU HOME AND STAY WITH YOU FOR 24 HOURS.  Name and phone number of your driver:  Special Instructions: N/A              Please read over the following fact sheets you were given: _____________________________________________________________________             [facility] - Preparing for Surgery Before surgery, you can play an important role.  Because skin is not sterile, your skin needs to be as free of germs as possible.  You can reduce the number of germs on your skin by washing with CHG (chlorahexidine gluconate) soap before surgery.  CHG is an antiseptic cleaner which kills germs and bonds with the skin to continue killing germs even after washing. Please DO NOT use if you have an allergy to CHG or antibacterial soaps.  If your skin becomes reddened/irritated stop using the CHG and inform your nurse when you arrive at Short Stay. Do not shave (including legs and underarms) for at least 48 hours prior to the first CHG shower.   Please follow these instructions carefully:  1.  Shower with CHG Soap the night before surgery and the  morning of Surgery.  2.  If you choose to wash your hair, wash your hair first as usual with your  normal  shampoo.  3.  After you shampoo, rinse your hair and body  thoroughly to remove the  shampoo.                            4.  Use CHG as you would any other liquid soap.  You can apply chg directly  to the skin and wash                       Gently with a scrungie or clean washcloth.  5.  Apply the CHG Soap to your body ONLY FROM THE NECK DOWN.   Do not use on face/ open                           Wound or open sores. Avoid contact with eyes, ears mouth and genitals (private parts).                       Wash face,  Genitals (private parts) with your normal  soap.             6.  Wash thoroughly, paying special attention to the area where your surgery  will be performed.  7.  Thoroughly rinse your body with warm water from the neck down.  8.  DO NOT shower/wash with your normal soap after using and rinsing off  the CHG Soap.                9.  Pat yourself dry with a clean towel.            10.  Wear clean pajamas.            11.  Place clean sheets on your bed the night of your first shower and do not  sleep with pets. Day of Surgery : Do not apply any lotions/deodorants the morning of surgery.  Please wear clean clothes to the hospital/surgery center.  FAILURE TO FOLLOW THESE INSTRUCTIONS MAY RESULT IN THE CANCELLATION OF YOUR SURGERY PATIENT SIGNATURE_________________________________  NURSE SIGNATURE__________________________________  ________________________________________________________________________

## 2021-04-23 ENCOUNTER — Ambulatory Visit: Payer: PRIVATE HEALTH INSURANCE | Admitting: Gynecologic Oncology

## 2021-04-23 ENCOUNTER — Encounter (HOSPITAL_COMMUNITY): Payer: Self-pay

## 2021-04-23 ENCOUNTER — Encounter (HOSPITAL_COMMUNITY)
Admit: 2021-04-23 | Discharge: 2021-04-23 | Disposition: A | Discharge status: Home / Self Care | Payer: PRIVATE HEALTH INSURANCE | Attending: Anesthesiology | Admitting: Anesthesiology

## 2021-04-23 HISTORY — DX: Depression, unspecified: F32.A

## 2021-04-23 HISTORY — DX: Malignant (primary) neoplasm, unspecified: C80.1

## 2021-04-23 NOTE — Progress Notes (Signed)
COVID test- NA   PCP - Juluis Mire Cardiologist - no  Chest x-ray - 04/06/21-epic EKG - 04/08/21-epic Stress Test - no ECHO - no Cardiac Cath - no Pacemaker/ICD device last checked:NA  Sleep Study - no CPAP -   Fasting Blood Sugar - NA Checks Blood Sugar _____ times a day  Blood Thinner Instructions:NA Aspirin Instructions: Last Dose:  Anesthesia review: no  Patient denies shortness of breath, fever, cough and chest pain at PAT appointment Pt reports no SOB with any activities  Patient verbalized understanding of instructions that were given to them at the PAT appointment. Patient was also instructed that they will need to review over the PAT instructions again at home before surgery.  Pt missed her PAT visit. Phone interview was done and she will come in for labs on 12/21.22. Instructions were reviewed over the phone

## 2021-04-29 ENCOUNTER — Other Ambulatory Visit: Payer: Self-pay

## 2021-04-29 ENCOUNTER — Ambulatory Visit (HOSPITAL_COMMUNITY)
Admission: RE | Admit: 2021-04-29 | Discharge: 2021-04-29 | Disposition: A | Discharge status: Home / Self Care | Payer: PRIVATE HEALTH INSURANCE | Source: Ambulatory Visit | Attending: Gynecologic Oncology | Admitting: Gynecologic Oncology

## 2021-04-29 DIAGNOSIS — R1900 Intra-abdominal and pelvic swelling, mass and lump, unspecified site: Secondary | ICD-10-CM

## 2021-04-30 ENCOUNTER — Encounter: Payer: PRIVATE HEALTH INSURANCE | Admitting: Gynecologic Oncology

## 2021-04-30 ENCOUNTER — Encounter (HOSPITAL_COMMUNITY)
Admission: RE | Admit: 2021-04-30 | Discharge: 2021-04-30 | Disposition: A | Discharge status: Home / Self Care | Payer: PRIVATE HEALTH INSURANCE | Source: Ambulatory Visit | Attending: Gynecologic Oncology | Admitting: Gynecologic Oncology

## 2021-04-30 DIAGNOSIS — R1900 Intra-abdominal and pelvic swelling, mass and lump, unspecified site: Secondary | ICD-10-CM | POA: Insufficient documentation

## 2021-04-30 DIAGNOSIS — K8020 Calculus of gallbladder without cholecystitis without obstruction: Secondary | ICD-10-CM | POA: Diagnosis not present

## 2021-04-30 DIAGNOSIS — R599 Enlarged lymph nodes, unspecified: Secondary | ICD-10-CM | POA: Insufficient documentation

## 2021-04-30 DIAGNOSIS — R161 Splenomegaly, not elsewhere classified: Secondary | ICD-10-CM | POA: Insufficient documentation

## 2021-04-30 DIAGNOSIS — D259 Leiomyoma of uterus, unspecified: Secondary | ICD-10-CM | POA: Diagnosis not present

## 2021-04-30 DIAGNOSIS — Z01812 Encounter for preprocedural laboratory examination: Secondary | ICD-10-CM | POA: Insufficient documentation

## 2021-04-30 LAB — COMPREHENSIVE METABOLIC PANEL
ALT: 12 U/L (ref 0–44)
AST: 19 U/L (ref 15–41)
Albumin: 3.8 g/dL (ref 3.5–5.0)
Alkaline Phosphatase: 41 U/L (ref 38–126)
Anion gap: 7 (ref 5–15)
BUN: 6 mg/dL (ref 6–20)
CO2: 26 mmol/L (ref 22–32)
Calcium: 9.1 mg/dL (ref 8.9–10.3)
Chloride: 103 mmol/L (ref 98–111)
Creatinine, Ser: 0.67 mg/dL (ref 0.44–1.00)
GFR, Estimated: >60 mL/min (ref >60)
Glucose, Bld: 84 mg/dL (ref 70–99)
Potassium: 3.8 mmol/L (ref 3.5–5.1)
Sodium: 136 mmol/L (ref 135–145)
Total Bilirubin: 0.5 mg/dL (ref 0.3–1.2)
Total Protein: 8.9 g/dL — ABNORMAL HIGH (ref 6.5–8.1)

## 2021-04-30 LAB — CBC
HCT: 34.8 % — ABNORMAL LOW (ref 36.0–46.0)
Hemoglobin: 10.7 g/dL — ABNORMAL LOW (ref 12.0–15.0)
MCH: 28.7 pg (ref 26.0–34.0)
MCHC: 30.7 g/dL (ref 30.0–36.0)
MCV: 93.3 fL (ref 80.0–100.0)
Platelets: 298 10*3/uL (ref 150–400)
RBC: 3.73 MIL/uL — ABNORMAL LOW (ref 3.87–5.11)
RDW: 18.1 % — ABNORMAL HIGH (ref 11.5–15.5)
WBC: 5.8 10*3/uL (ref 4.0–10.5)
nRBC: 0 % (ref 0.0–0.2)

## 2021-04-30 LAB — GLUCOSE, CAPILLARY: Glucose-Capillary: 90 mg/dL (ref 70–99)

## 2021-04-30 MED ORDER — FLUDEOXYGLUCOSE F - 18 (FDG) INJECTION
8.9100 | Freq: Once | INTRAVENOUS | Status: AC | PRN
  Administered 2021-04-30: 11:00:00 8.91 via INTRAVENOUS

## 2021-05-01 ENCOUNTER — Encounter: Payer: Self-pay | Admitting: Gynecologic Oncology

## 2021-05-01 ENCOUNTER — Telehealth: Payer: Self-pay | Admitting: Gynecologic Oncology

## 2021-05-01 DIAGNOSIS — R1900 Intra-abdominal and pelvic swelling, mass and lump, unspecified site: Secondary | ICD-10-CM

## 2021-05-01 NOTE — Addendum Note (Signed)
Addended by: Lafonda Mosses on: 05/01/2021 02:56 PM   Modules accepted: Orders

## 2021-05-01 NOTE — Telephone Encounter (Signed)
Called and spoke with the patient about pelvic ultrasound and PET results from earlier this week.  Overall, fluid collection in the cul-de-sac has decreased in size, supporting infectious etiology.  Patient feels better with less pain.  She and I had previously discussed the concerns with surgery in the setting of pelvic infection.  My recommendation, based on new imaging, is that we pursue either continued close follow-up with interval imaging versus IR guided drainage of this cystic area.  Drainage would allow [place] to send new cultures and cytology and likely lead to the resolution of the cystic area much faster.  Patient is amenable to proceeding with drainage.  She and I discussed that this may be drainage versus drain placement for a limited period of time.  Jeral Pinch MD Gynecologic Oncology

## 2021-05-02 ENCOUNTER — Inpatient Hospital Stay: Payer: PRIVATE HEALTH INSURANCE | Admitting: Gynecologic Oncology

## 2021-05-06 ENCOUNTER — Encounter (HOSPITAL_COMMUNITY): Admission: RE | Payer: Self-pay | Source: Home / Self Care

## 2021-05-06 ENCOUNTER — Ambulatory Visit (HOSPITAL_COMMUNITY)
Admission: RE | Admit: 2021-05-06 | Payer: PRIVATE HEALTH INSURANCE | Source: Home / Self Care | Admitting: Gynecologic Oncology

## 2021-05-06 ENCOUNTER — Encounter (HOSPITAL_COMMUNITY): Payer: Self-pay

## 2021-05-06 DIAGNOSIS — R1900 Intra-abdominal and pelvic swelling, mass and lump, unspecified site: Secondary | ICD-10-CM

## 2021-05-06 DIAGNOSIS — R599 Enlarged lymph nodes, unspecified: Secondary | ICD-10-CM

## 2021-05-06 HISTORY — DX: Localized enlarged lymph nodes: R59.0

## 2021-05-06 HISTORY — DX: Intra-abdominal and pelvic swelling, mass and lump, unspecified site: R19.00

## 2021-05-06 LAB — TYPE AND SCREEN
ABO/RH(D): A POS
Antibody Screen: NEGATIVE

## 2021-05-06 SURGERY — EXCISION MASS
Anesthesia: General

## 2021-05-06 NOTE — Progress Notes (Signed)
Patient Name  Daria, Mcmeekin Legal Sex  Female DOB  Aug 21, 1980 SSN  [id number] Address  [place]  [place] [address] Phone  [phone] [facility])  [phone] (Mobile)    RE: CT IMAGE GUIDED DRAINAGE Received: 3 days ago Sandi Mariscal, MD  Valli Glance OK for CT guided aspiration versus drain placement.   PET CT - 12/21 - image 176, series 4; Decreased in size compared to CT AP from 11/27 and pelvic MRI from 11/28.   Cathren Harsh        Previous Messages   ----- Message -----  From: Valli Glance  Sent: 05/01/2021   6:37 PM EST  To: Ir Procedure Requests  Subject: CT IMAGE GUIDED DRAINAGE                       CT IMAGE GUIDED DRAINAGE       Reason: Pelvic mass, Cul de sac cyst (decreased in size with antibiotics, suspect infected)       History: [place] and PET and MRI and CT In Computer

## 2021-05-15 ENCOUNTER — Other Ambulatory Visit: Payer: Self-pay | Admitting: Radiology

## 2021-05-16 ENCOUNTER — Other Ambulatory Visit: Payer: Self-pay | Admitting: Radiology

## 2021-05-19 ENCOUNTER — Other Ambulatory Visit: Payer: Self-pay

## 2021-05-19 ENCOUNTER — Encounter (HOSPITAL_COMMUNITY): Payer: Self-pay

## 2021-05-19 ENCOUNTER — Other Ambulatory Visit: Payer: Self-pay | Admitting: Gynecologic Oncology

## 2021-05-19 ENCOUNTER — Ambulatory Visit (HOSPITAL_COMMUNITY)
Admission: RE | Admit: 2021-05-19 | Discharge: 2021-05-19 | Disposition: A | Discharge status: Home / Self Care | Payer: PRIVATE HEALTH INSURANCE | Source: Ambulatory Visit | Attending: Gynecologic Oncology | Admitting: Gynecologic Oncology

## 2021-05-19 DIAGNOSIS — R1900 Intra-abdominal and pelvic swelling, mass and lump, unspecified site: Secondary | ICD-10-CM | POA: Insufficient documentation

## 2021-05-19 DIAGNOSIS — Z87891 Personal history of nicotine dependence: Secondary | ICD-10-CM | POA: Diagnosis not present

## 2021-05-19 LAB — CBC
HCT: 34.5 % — ABNORMAL LOW (ref 36.0–46.0)
Hemoglobin: 11.4 g/dL — ABNORMAL LOW (ref 12.0–15.0)
MCH: 30 pg (ref 26.0–34.0)
MCHC: 33 g/dL (ref 30.0–36.0)
MCV: 90.8 fL (ref 80.0–100.0)
Platelets: 187 10*3/uL (ref 150–400)
RBC: 3.8 MIL/uL — ABNORMAL LOW (ref 3.87–5.11)
RDW: 16.3 % — ABNORMAL HIGH (ref 11.5–15.5)
WBC: 8.8 10*3/uL (ref 4.0–10.5)
nRBC: 0 % (ref 0.0–0.2)

## 2021-05-19 LAB — PREGNANCY, URINE: Preg Test, Ur: NEGATIVE

## 2021-05-19 MED ORDER — MIDAZOLAM HCL 2 MG/2ML IJ SOLN
INTRAMUSCULAR | Status: AC
  Filled 2021-05-19: qty 4

## 2021-05-19 MED ORDER — SODIUM CHLORIDE 0.9 % IV SOLN: INTRAVENOUS | Status: DC

## 2021-05-19 MED ORDER — LIDOCAINE HCL 1 % IJ SOLN
INTRAMUSCULAR | Status: AC
  Filled 2021-05-19: qty 10

## 2021-05-19 MED ORDER — MIDAZOLAM HCL 2 MG/2ML IJ SOLN
INTRAMUSCULAR | Status: DC | PRN
  Administered 2021-05-19 (×3): 1 mg via INTRAVENOUS

## 2021-05-19 MED ORDER — MIDAZOLAM HCL 5 MG/5ML IJ SOLN: INTRAMUSCULAR | Status: DC | PRN

## 2021-05-19 MED ORDER — FENTANYL CITRATE (PF) 100 MCG/2ML IJ SOLN
INTRAMUSCULAR | Status: DC | PRN
  Administered 2021-05-19 (×2): 50 ug via INTRAVENOUS

## 2021-05-19 MED ORDER — FENTANYL CITRATE (PF) 100 MCG/2ML IJ SOLN
INTRAMUSCULAR | Status: AC
  Filled 2021-05-19: qty 4

## 2021-05-19 NOTE — Procedures (Signed)
Pre procedural Dx: Pelvic fluid collection Post procedural Dx: Same  Technically successful CT guided aspiration of 30 cc of serous fluid from indeterminate pelvic fluid collection.  All aspirated fluid was capped and sent to the laboratory for analysis.    EBL: Trace Complications: None immediate  Ronny Bacon, MD Pager #: [phone]

## 2021-05-19 NOTE — H&P (Signed)
Chief Complaint: Patient was seen in consultation today for pelvic fluid collection aspiration/drain placement at the request of Tucker,Katherine R  Referring Physician(s): Tucker,Katherine R  Supervising Physician: Sandi Mariscal  Patient Status: [facility] - Out-pt  History of Present Illness: Caitlin Maddox is a 41 y.o. female    Pt developed sudden low abd pain before Thanksgiving 2022 Subsided for few days then worsened and was seen by PMD Sent directly to ED 04/06/21 Abd pain; fever/chills Abnormal uterine bleeding  CT: IMPRESSION: 1. Trace right pleural effusion. 2. Splenomegaly as well as retroperitoneal and retrocrural lymphadenopathy concerning for lymphoproliferative disorder/lymphoma. Recommend PET-CT further evaluation. 3. Interval development of a 9 cm fluid density lesion likely arising from the uterus/cervix. Lesion is found to be inseparable from both the uterus/cervix as well as rectum. Fibroid uterus is also noted. Recommend pelvic ultrasound further evaluation. 4. Unclear positioning of a T-shaped intrauterine device noted within the uterus. May be malpositioned/too low. Recommend pelvic ultrasound.  MRI:  IMPRESSION: 1. Large cystic lesion in the cul-de-sac has a fluid-fluid level and only mild marginal enhancement although does have some nodular enhancing components along the left anterior margin and to a lesser extent along the right anterior margin adjacent to the ovaries. Given that this is new compared to 02/01/2021, I am skeptical that it represents an ovarian malignancy. More likely this is some form of hemorrhagic cystic lesion or less likely abscess in the cul-de-sac region. A could be arising from either ovary based on the abutment of both ovaries. There is edema tracking around both ovaries and in the pelvic sidewalls which is nonspecific, along with nonspecific presacral edema. Presumably this could be related to inflammation in the region, and  if the has fever then the possibility of a large tubo-ovarian abscess extending in the cul-de-sac should be considered. 2. Mildly complex cystic lesion but without internal nodular enhancement anteriorly in the right ovary, probably a benign complex cyst given the thin marginal enhancement. 3. Notable uterine fibroids including a lower uterine segment subserosal fibroid. 4. Borderline right external iliac adenopathy, nonspecific.  IUD removed at that time  DC 12/2 on antibiotics and feeling better Follows with ID Just finished antibiotic Fri 05/16/21   PET 04/30/21: IMPRESSION: 1. Complex cystic lesion within the pelvic cul-de-sac with peripheral hypermetabolism. Decreased in size compared to 04/06/2021, favoring improving infection/abscess. 2. Hypermetabolic small bilateral cervical and abdominal retroperitoneal nodes. Favored to be reactive. Recommend clinical correlation for lymphoproliferative process, felt less likely. Consider imaging follow-up with abdominal CT or MRI at 3-6 months. Contrast enhanced neck CT follow-up at 3-6 months also suggested. 3. Incidental findings, including: Fibroid uterus. Large colonic stool burden. Cholelithiasis.  Dr Berline Lopes note 05/01/21: Called and spoke with the patient about pelvic ultrasound and PET results from earlier this week.  Overall, fluid collection in the cul-de-sac has decreased in size, supporting infectious etiology.  Patient feels better with less pain.  She and I had previously discussed the concerns with surgery in the setting of pelvic infection.  My recommendation, based on new imaging, is that we pursue either continued close follow-up with interval imaging versus IR guided drainage of this cystic area.  Drainage would allow [place] to send new cultures and cytology and likely lead to the resolution of the cystic area much faster.  Patient is amenable to proceeding with drainage.  She and I discussed that this may be drainage versus  drain placement for a limited period of time. Jeral Pinch MD Gynecologic Oncology  Scheduled now  for evaluation of collection and possible aspiration/drain placement     Past Medical History:  Diagnosis Date   Cancer [facility])    Depression    Dysfunctional uterine bleeding    Pelvic mass in female    Retroperitoneal lymphadenopathy    Symptomatic anemia     Past Surgical History:  Procedure Laterality Date   CESAREAN SECTION  2005   CESAREAN SECTION  2006   TONSILLECTOMY     as a child    Allergies: Clindamycin/lincomycin  Medications: Prior to Admission medications   Medication Sig Start Date End Date Taking? Authorizing Provider  acetaminophen (TYLENOL) 500 MG tablet Take 500 mg by mouth every 8 (eight) hours as needed for moderate pain.   Yes [provider]  busPIRone (BUSPAR) 5 MG tablet Take 1 tablet (5 mg total) by mouth 2 (two) times daily. 04/17/21  Yes Kerin Perna, NP  docusate sodium (COLACE) 100 MG capsule Take 100 mg by mouth 2 (two) times daily.   Yes [provider]  ferrous sulfate (FERROUSUL) 325 (65 FE) MG tablet Take 1 tablet (325 mg total) by mouth 2 (two) times daily. 02/05/21  Yes Patrecia Pour, MD  ibuprofen (ADVIL) 200 MG tablet Take 600 mg by mouth every 6 (six) hours as needed for mild pain, headache or cramping.   Yes [provider]  vitamin B-12 (CYANOCOBALAMIN) 500 MCG tablet Take 1 tablet (500 mcg total) by mouth daily. 02/06/21  Yes Patrecia Pour, MD  polyethylene glycol (MIRALAX / GLYCOLAX) 17 g packet Take 17 g by mouth 2 (two) times daily. Patient not taking: Reported on 05/13/2021 04/11/21   Regalado, Jerald Kief A, MD  traMADol (ULTRAM) 50 MG tablet Take 1 tablet (50 mg total) by mouth every 6 (six) hours as needed for severe pain. Do not take and drive Patient not taking: Reported on 05/13/2021 04/21/21   Dorothyann Gibbs, NP     Family History  Problem Relation Age of Onset   Hypertension Mother      Social History   Socioeconomic History   Marital status: Single    Spouse name: Not on file   Number of children: Not on file   Years of education: Not on file   Highest education level: Not on file  Occupational History   Occupation: CNA  Tobacco Use   Smoking status: Former    Packs/day: 1.00    Years: 33.00    Pack years: 33.00    Types: Cigarettes    Quit date: 10/09/2020    Years since quitting: 0.6   Smokeless tobacco: Never  Vaping Use   Vaping Use: Never used  Substance and Sexual Activity   Alcohol use: No   Drug use: No   Sexual activity: Not on file  Other Topics Concern   Not on file  Social History Narrative   Not on file   Social Determinants of Health   Financial Resource Strain: Not on file  Food Insecurity: Not on file  Transportation Needs: Not on file  Physical Activity: Not on file  Stress: Not on file  Social Connections: Not on file    Review of Systems: A 12 point ROS discussed and pertinent positives are indicated in the HPI above.  All other systems are negative.  Review of Systems  Constitutional:  Positive for activity change and unexpected weight change. Negative for fatigue and fever.  Respiratory:  Negative for cough and shortness of breath.   Cardiovascular:  Negative  for chest pain.  Gastrointestinal:  Negative for abdominal pain, nausea and vomiting.  Musculoskeletal:  Negative for back pain.  Neurological:  Negative for weakness.  Psychiatric/Behavioral:  Negative for behavioral problems and confusion.    Vital Signs: BP (!) 139/95    Pulse 84    Temp 97.8 F (36.6 C) (Oral)    Resp 15    Ht 6' (1.829 m)    Wt 178 lb (80.7 kg)    LMP 05/15/2021 (Exact Date) Comment: pt states "last week"   SpO2 95%    BMI 24.14 kg/m   Physical Exam Vitals reviewed.  HENT:     Mouth/Throat:     Mouth: Mucous membranes are moist.  Cardiovascular:     Rate and Rhythm: Normal rate and regular rhythm.  Pulmonary:     Effort: Pulmonary  effort is normal.     Breath sounds: Normal breath sounds.  Abdominal:     Palpations: Abdomen is soft.     Tenderness: There is no abdominal tenderness.  Musculoskeletal:        General: Normal range of motion.  Skin:    General: Skin is warm.  Neurological:     Mental Status: She is alert and oriented to person, place, and time.  Psychiatric:        Behavior: Behavior normal.    Imaging: NM PET Image Initial (PI) Skull Base To Thigh (F-18 FDG)  Result Date: 04/30/2021 CLINICAL DATA:  Initial treatment strategy for cystic lesion within the pelvis on prior CT and MRI, new since 02/01/2021. Weight loss. Adenopathy. Splenomegaly. EXAM: NUCLEAR MEDICINE PET SKULL BASE TO THIGH TECHNIQUE: 8.9 mCi F-18 FDG was injected intravenously. Full-ring PET imaging was performed from the skull base to thigh after the radiotracer. CT data was obtained and used for attenuation correction and anatomic localization. Fasting blood glucose: 90 mg/dl COMPARISON:  Chest CT 04/09/2021.  Abdominopelvic CT 04/06/2021. FINDINGS: Mediastinal blood pool activity: SUV max 1.8 Liver activity: SUV max NA NECK: Symmetric tongue base/vallecula hypermetabolism at a S.U.V. max of 10.1 without well-defined CT correlate. Bilateral hypermetabolic jugulodigastric nodes. Example on the left at 7 mm and a S.U.V. max of 4.7 on 26/4. On the right at 6 mm and a S.U.V. max of 5.0 on 26/4. Incidental CT findings: none CHEST: No pulmonary parenchymal or thoracic nodal hypermetabolism. Incidental CT findings: Normal heart size, without pericardial effusion. No thoracic adenopathy. ABDOMEN/PELVIS: Hypermetabolism corresponding to abdominal retroperitoneal nodes. Example aortocaval node of 9 mm and a S.U.V. max of 5.3 on 136/4. Low left periaortic node of 7 mm and a S.U.V. max of 4.2 on 138/4. Bilateral adnexal hypermetabolism is likely physiologic in this premenopausal female. The complex cystic cul-de-sac lesion measures on the order of 7.9 x  4.3 cm and demonstrates extensive peripheral hypermetabolism, including at a S.U.V. max of 24.1. This is decreased in size compared to 04/06/2021, where it measured 10.6 x 0.7 cm. Incidental CT findings: Deferred to recent diagnostic CT. 1.5 cm gallstone. Large colonic stool burden. Abdominal aortic atherosclerosis. Fibroid uterus. Mild splenomegaly again identified. No splenic hypermetabolism. SKELETON: No abnormal marrow activity. Incidental CT findings: none IMPRESSION: 1. Complex cystic lesion within the pelvic cul-de-sac with peripheral hypermetabolism. Decreased in size compared to 04/06/2021, favoring improving infection/abscess. 2. Hypermetabolic small bilateral cervical and abdominal retroperitoneal nodes. Favored to be reactive. Recommend clinical correlation for lymphoproliferative process, felt less likely. Consider imaging follow-up with abdominal CT or MRI at 3-6 months. Contrast enhanced neck CT follow-up at 3-6 months also  suggested. 3. Incidental findings, including: Fibroid uterus. Large colonic stool burden. Cholelithiasis. Electronically Signed   By: Abigail Miyamoto M.D.   On: 04/30/2021 15:02   US PELVIC COMPLETE WITH TRANSVAGINAL  Result Date: 04/30/2021 CLINICAL DATA:  Possible pelvic mass. EXAM: TRANSABDOMINAL AND TRANSVAGINAL ULTRASOUND OF PELVIS TECHNIQUE: Both transabdominal and transvaginal ultrasound examinations of the pelvis were performed. Transabdominal technique was performed for global imaging of the pelvis including uterus, ovaries, adnexal regions, and pelvic cul-de-sac. It was necessary to proceed with endovaginal exam following the transabdominal exam to visualize the endometrium and ovaries. COMPARISON:  Pelvic ultrasound 04/02/2021 as well as CT 02/01/2021 & 04/06/2021 and MRI 04/07/2021 FINDINGS: Uterus Measurements: 10.0 x 6.5 x 7.8 cm = volume: 267 mL. Two known fibroids measuring 3.5 cm and 5.3 cm respectively. Point. Endometrium Thickness: 11 mm.  No focal abnormality  visualized. Right ovary Measurements: 3.0 x 1.7 x 2.6 cm = volume: 7.1 mL. Normal appearance/no adnexal mass. Normal color Doppler. Left ovary Measurements: 4.3 x 2.2 x 2.2 cm = volume: 10.7 ML. Normal appearance/no adnexal mass. Normal color Doppler. Other findings Oval minimally complicated cystic collection in the cul-de-sac abutting the posterior border of the uterus decreased in size now measuring 3.5 x 4.1 x 8.1 cm (previously 6.2 x 7.1 x 11.4 cm). The previously seen fluid-fluid level is present but slightly less apparent. IMPRESSION: 1. Significant interval decrease in size of complex fluid collection over the cul-de-sac measuring 3.5 x 4.1 x 8.1 cm (previously 6.2 x 7.1 x 11.4 cm). The fluid-fluid level is still present but less apparent. Additional follow-up pelvic ultrasound or CT in 6 weeks would be helpful to document continued resolution of this probable benign process. 2.  Stable known uterine fibroids. Electronically Signed   By: Marin Olp M.D.   On: 04/30/2021 14:36    Labs:  CBC: Recent Labs    04/10/21 0117 04/11/21 0446 04/21/21 1348 04/30/21 1256  WBC 10.2 11.5* 8.2 5.8  HGB 8.1* 8.0* 9.9* 10.7*  HCT 25.2* 26.2* 31.6* 34.8*  PLT 267 290 512* 298    COAGS: No results for input(s): INR, APTT in the last 8760 hours.  BMP: Recent Labs    04/10/21 0117 04/11/21 0446 04/21/21 1348 04/30/21 1256  NA 133* 133* 138 136  K 4.1 4.1 4.2 3.8  CL 99 100 99 103  CO2 25 26 29 26   GLUCOSE 105* 101* 85 84  BUN <5* <5* 6 6  CALCIUM 8.4* 8.6* 9.6 9.1  CREATININE 0.68 0.74 0.76 0.67  GFRNONAA >60 >60 >60 >60    LIVER FUNCTION TESTS: Recent Labs    04/10/21 0117 04/11/21 0446 04/21/21 1348 04/30/21 1256  BILITOT 0.4 0.3 <0.2* 0.5  AST 33 31 17 19   ALT 31 30 9 12   ALKPHOS 101 95 61 41  PROT 7.5 7.9 9.9* 8.9*  ALBUMIN 2.4* 2.5* 3.3* 3.8    TUMOR MARKERS: No results for input(s): AFPTM, CEA, CA199, CHROMGRNA in the last 8760 hours.  Assessment and  Plan:  Pelvic fluid collection Follows with Dr Berline Lopes OB/GYN and ID Favoring infection at this point Imaging showing smaller collection with antibiotic treatment Most recent imaging 05/01/21 Now scheduled for IR evaluation with possible aspiration/drain placement Risks and benefits discussed with the patient including bleeding, infection, damage to adjacent structures, bowel perforation/fistula connection, and sepsis. All of the patient's questions were answered, patient is agreeable to proceed. Consent signed and in chart.   Thank you for this interesting consult.  I greatly enjoyed  meeting [name] and look forward to participating in their care.  A copy of this report was sent to the requesting provider on this date.  Electronically Signed: Lavonia Drafts, PA-C 05/19/2021, 9:37 AM   I spent a total of  30 Minutes   in face to face in clinical consultation, greater than 50% of which was counseling/coordinating care for pelvic fluid collection aspiration/drain

## 2021-05-19 NOTE — Discharge Instructions (Addendum)
Call radiology dept if any problems, questions , or concerns; call if any signs of bleeding; call if any signs of infection redness, drainage, fever, pain, swelling, or redness; you may remove bandaid tomorrow and shower, do no sit in water until site heals

## 2021-05-20 LAB — CYTOLOGY - NON PAP

## 2021-05-21 ENCOUNTER — Telehealth: Payer: Self-pay | Admitting: Gynecologic Oncology

## 2021-05-21 NOTE — Telephone Encounter (Signed)
Called patient. She feels "great". Denies any pain. Discussed biopsy of fluid which shows no cancer cells, no growth to date in the culture. I will see her back at the end of the month in clinic, likely repeat imaging in February to assess adenopathy.   Jeral Pinch MD Gynecologic Oncology

## 2021-05-23 ENCOUNTER — Ambulatory Visit: Payer: PRIVATE HEALTH INSURANCE | Admitting: Internal Medicine

## 2021-05-24 LAB — AEROBIC/ANAEROBIC CULTURE W GRAM STAIN (SURGICAL/DEEP WOUND): Culture: NO GROWTH

## 2021-05-30 ENCOUNTER — Encounter: Payer: Self-pay | Admitting: Gynecologic Oncology

## 2021-06-02 ENCOUNTER — Other Ambulatory Visit: Payer: Self-pay

## 2021-06-02 ENCOUNTER — Encounter: Payer: Self-pay | Admitting: Gynecologic Oncology

## 2021-06-02 ENCOUNTER — Inpatient Hospital Stay: Payer: PRIVATE HEALTH INSURANCE | Attending: Gynecologic Oncology | Admitting: Gynecologic Oncology

## 2021-06-02 VITALS — BP 153/80 | HR 79 | Temp 98.0°F | Resp 18 | Ht 72.0 in | Wt 196.6 lb

## 2021-06-02 DIAGNOSIS — D259 Leiomyoma of uterus, unspecified: Secondary | ICD-10-CM | POA: Diagnosis not present

## 2021-06-02 DIAGNOSIS — Z79899 Other long term (current) drug therapy: Secondary | ICD-10-CM | POA: Diagnosis not present

## 2021-06-02 DIAGNOSIS — R591 Generalized enlarged lymph nodes: Secondary | ICD-10-CM

## 2021-06-02 DIAGNOSIS — R1900 Intra-abdominal and pelvic swelling, mass and lump, unspecified site: Secondary | ICD-10-CM | POA: Diagnosis present

## 2021-06-02 DIAGNOSIS — F32A Depression, unspecified: Secondary | ICD-10-CM | POA: Insufficient documentation

## 2021-06-02 DIAGNOSIS — R590 Localized enlarged lymph nodes: Secondary | ICD-10-CM | POA: Insufficient documentation

## 2021-06-02 DIAGNOSIS — R161 Splenomegaly, not elsewhere classified: Secondary | ICD-10-CM

## 2021-06-02 NOTE — Patient Instructions (Signed)
It was good to see you today.  I am very happy that you are feeling so much better!  Please call if you need anything over the next several months.  We will have a phone visit a day or 2 after your CT scan to review its findings.  This is to reevaluate your spleen, which was enlarged back at the time that you were hospitalized, your lymph nodes, as well as the mass in your pelvis.

## 2021-06-02 NOTE — Progress Notes (Signed)
Gynecologic Oncology Return Clinic Visit  06/02/2021  Reason for Visit: Follow-up after recent percutaneous cyst drainage.  Treatment History: 50-month history of abnormal uterine bleeding 04/06/21: Presented to the emergency department with pelvic pain and fever/chills.  Endorsed decreased appetite with weight loss.  Found to have mild leukocytosis.  Imaging notable for cystic mass within the cul-de-sac that was new since September, retroperitoneal adenopathy, splenomegaly, and possible patchy lung opacities. 11/27: IUD removed. 11/30: Pap negative, HR HPV negative. EMB shows fragments of proliferative endometrium, no hyperplasia or malignancy. 04/11/21: Patient discharged on IV antibiotics for presumed treatment of PID. 04/29/2021: Pelvic ultrasound showed significant interval decrease in size of complex fluid collection in the cul-de-sac now measuring 3.5 x 4.1 x 8.1 cm.  Fluid/fluid level is still present but less apparent.  Stable known fibroids noted. 04/30/2021: PET scan shows complex cystic mass within the pelvic cul-de-sac with peripheral hypermetabolism.  Decrease in size favoring improving infection/abscess.  Hypermetabolic small bilateral cervical and abdominal retroperitoneal nodes favored to be reactive.  Follow-up imaging in 3 to 6 months suggested. 05/19/2021: CT-guided aspiration of 30 cc of serous fluid from pelvic fluid collection.  Further decrease in size of collection noted, now measuring 5.1 x 3.7 cm.  Culture and gram stain revealed rare WBC, predominantly mononuclear.  No organisms seen.  Cytology negative for malignant cells.  Interval History: Patient reports overall doing very well.  Appetite has increased, denies any nausea or emesis.  Denies any abdominal or pelvic pain.  Reports regular bowel and bladder function.  Denies any fevers or chills.  Past Medical/Surgical History: Past Medical History:  Diagnosis Date   Cancer ([place])    Depression    Dysfunctional uterine  bleeding    Pelvic mass in female    Retroperitoneal lymphadenopathy    Symptomatic anemia     Past Surgical History:  Procedure Laterality Date   CESAREAN SECTION  2005   CESAREAN SECTION  2006   TONSILLECTOMY     as a child    Family History  Problem Relation Age of Onset   Hypertension Mother     Social History   Socioeconomic History   Marital status: Single    Spouse name: Not on file   Number of children: Not on file   Years of education: Not on file   Highest education level: Not on file  Occupational History   Occupation: CNA  Tobacco Use   Smoking status: Former    Packs/day: 1.00    Years: 33.00    Pack years: 33.00    Types: Cigarettes    Quit date: 10/09/2020    Years since quitting: 0.6   Smokeless tobacco: Never  Vaping Use   Vaping Use: Never used  Substance and Sexual Activity   Alcohol use: No   Drug use: No   Sexual activity: Not on file  Other Topics Concern   Not on file  Social History Narrative   Not on file   Social Determinants of Health   Financial Resource Strain: Not on file  Food Insecurity: Not on file  Transportation Needs: Not on file  Physical Activity: Not on file  Stress: Not on file  Social Connections: Not on file    Current Medications:  Current Outpatient Medications:    acetaminophen (TYLENOL) 500 MG tablet, Take 500 mg by mouth every 8 (eight) hours as needed for moderate pain., Disp: , Rfl:    busPIRone (BUSPAR) 5 MG tablet, Take 1 tablet (5 mg total) by  mouth 2 (two) times daily., Disp: 180 tablet, Rfl: 1   docusate sodium (COLACE) 100 MG capsule, Take 100 mg by mouth 2 (two) times daily., Disp: , Rfl:    ferrous sulfate (FERROUSUL) 325 (65 FE) MG tablet, Take 1 tablet (325 mg total) by mouth 2 (two) times daily., Disp: 60 tablet, Rfl: 1   ibuprofen (ADVIL) 200 MG tablet, Take 600 mg by mouth every 6 (six) hours as needed for mild pain, headache or cramping., Disp: , Rfl:    vitamin B-12 (CYANOCOBALAMIN) 500  MCG tablet, Take 1 tablet (500 mcg total) by mouth daily., Disp: 30 tablet, Rfl: 1   polyethylene glycol (MIRALAX / GLYCOLAX) 17 g packet, Take 17 g by mouth 2 (two) times daily. (Patient not taking: Reported on 05/13/2021), Disp: 14 each, Rfl: 0   traMADol (ULTRAM) 50 MG tablet, Take 1 tablet (50 mg total) by mouth every 6 (six) hours as needed for severe pain. Do not take and drive (Patient not taking: Reported on 05/13/2021), Disp: 25 tablet, Rfl: 0  Review of Systems: Denies appetite changes, fevers, chills, fatigue, unexplained weight changes. Denies hearing loss, neck lumps or masses, mouth sores, ringing in ears or voice changes. Denies cough or wheezing.  Denies shortness of breath. Denies chest pain or palpitations. Denies leg swelling. Denies abdominal distention, pain, blood in stools, constipation, diarrhea, nausea, vomiting, or early satiety. Denies pain with intercourse, dysuria, frequency, hematuria or incontinence. Denies hot flashes, pelvic pain, vaginal bleeding or vaginal discharge.   Denies joint pain, back pain or muscle pain/cramps. Denies itching, rash, or wounds. Denies dizziness, headaches, numbness or seizures. Denies swollen lymph nodes or glands, denies easy bruising or bleeding. Denies anxiety, depression, confusion, or decreased concentration.  Physical Exam: BP (!) 153/80 (BP Location: Right Arm, Patient Position: Sitting)    Pulse 79    Temp 98 F (36.7 C) (Tympanic)    Resp 18    Ht 6' (1.829 m)    Wt 196 lb 9.6 oz (89.2 kg) Comment: pt says shes gained weight   LMP 05/15/2021 (Exact Date) Comment: pt states "last week"   SpO2 100%    BMI 26.66 kg/m  General: Alert, oriented, no acute distress. HEENT: Normocephalic, atraumatic, sclera anicteric. Chest: Unlabored breathing on room air. Abdomen: soft, nontender.  Normoactive bowel sounds.  No masses or hepatosplenomegaly appreciated.   Extremities: Grossly normal range of motion.  Warm, well perfused.  No edema  bilaterally. Skin: No rashes or lesions noted. Lymphatics: No cervical, supraclavicular, or inguinal adenopathy. GU: Normal appearing external genitalia without erythema, excoriation, or lesions.  Bimanual exam reveals mobile uterus, fullness appreciated in the cul-de-sac between the uterus and rectum, both on bimanual and rectovaginal exam, smooth, approximately 4-6 cm.  Laboratory & Radiologic Studies: None new  Assessment & Plan: Caitlin Maddox is a 41 y.o. woman with initial presentation including cystic mass in the cul-de-sac, retroperitoneal adenopathy, and splenomegaly, treated initially for presumptive TOA, now with resolving cul-de-sac mass on imaging and aspiration of the fluid showing no active infection or malignancy.  Patient is overall doing very well and is asymptomatic at this time.  Given multiple findings including pelvic mass, adenopathy, and splenomegaly, we will plan to repeat imaging 3-6 months after her last imaging.  I will have a phone visit with her after this to discuss results and any additional follow-up needed.  She was asked to call the clinic if she develops any new or recurrent symptoms before that visit.  28 minutes of  total time was spent for this patient encounter, including preparation, face-to-face counseling with the patient and coordination of care, and documentation of the encounter.  Jeral Pinch, MD  Division of Gynecologic Oncology  Department of Obstetrics and Gynecology  [facility]

## 2021-06-03 ENCOUNTER — Other Ambulatory Visit: Payer: Self-pay

## 2021-06-03 ENCOUNTER — Ambulatory Visit (INDEPENDENT_AMBULATORY_CARE_PROVIDER_SITE_OTHER): Payer: PRIVATE HEALTH INSURANCE | Admitting: Internal Medicine

## 2021-06-03 ENCOUNTER — Encounter: Payer: Self-pay | Admitting: Internal Medicine

## 2021-06-03 VITALS — BP 131/87 | HR 76 | Temp 98.2°F | Wt 196.0 lb

## 2021-06-03 DIAGNOSIS — R161 Splenomegaly, not elsewhere classified: Secondary | ICD-10-CM

## 2021-06-03 DIAGNOSIS — R599 Enlarged lymph nodes, unspecified: Secondary | ICD-10-CM | POA: Diagnosis not present

## 2021-06-03 DIAGNOSIS — R1900 Intra-abdominal and pelvic swelling, mass and lump, unspecified site: Secondary | ICD-10-CM

## 2021-06-03 NOTE — Patient Instructions (Signed)
Thank you for coming to see me today. It was a pleasure seeing you.  To Do: Continue to monitor your symptoms off antibiotics and let us know if any new fevers, chills, abdominal pain, or other infectious symptoms  If you have any questions or concerns, please do not hesitate to call the office at [phone].  Take Care,   Caitlin Maddox

## 2021-06-03 NOTE — Progress Notes (Signed)
[facility] for Infectious Disease  CHIEF COMPLAINT:    Follow up for pelvic mass  SUBJECTIVE:    Caitlin Maddox is a 41 y.o. female with PMHx as below who presents to the clinic for pelvic mass.   Patient was seen by my partner, Dr Tommy Medal, in December 2022 for pelvic mass concerning for abscess in the setting of several months abnormal uterine bleeding.  She was treated with 2 weeks of ceftriaxone and flagyl.  She saw Dr Tommy Medal on 04/21/21 who transitioned her to cefdinir and flagyl for 3 additional weeks of therapy so that she got 5 total weeks of antibiotics.  She has also been following with Dr Berline Lopes of [facility] and has had interval imaging plus percutaneous aspiration of her cystic mass.  Repeat imaging has shown interval decrease in size of pelvic fluid collection.  Most recent imaging 05/19/21 measured this as 5.1*3.7cm.  She underwent IR aspiration on that date as well which showed 30cc of serous fluid.  Cultures and gram stain negative.  No malignant cells noted.  She has now been off antibiotics since 05/16/21.  She saw OB yesterday and the plan is for a repeat CT scan in the next couple months (Scheduled for 08/07/21).  She is doing well off antibiotics.  No fevers, chills.  No GI symptoms, no abdominal pain.  She tolerated antibiotics when she took them.    Please see A&P for the details of today's visit and status of the patient's medical problems.   Patient's Medications  New Prescriptions   No medications on file  Previous Medications   ACETAMINOPHEN (TYLENOL) 500 MG TABLET    Take 500 mg by mouth every 8 (eight) hours as needed for moderate pain.   BUSPIRONE (BUSPAR) 5 MG TABLET    Take 1 tablet (5 mg total) by mouth 2 (two) times daily.   DOCUSATE SODIUM (COLACE) 100 MG CAPSULE    Take 100 mg by mouth 2 (two) times daily.   FERROUS SULFATE (FERROUSUL) 325 (65 FE) MG TABLET    Take 1 tablet (325 mg total) by mouth 2 (two) times daily.   IBUPROFEN (ADVIL) 200 MG  TABLET    Take 600 mg by mouth every 6 (six) hours as needed for mild pain, headache or cramping.   POLYETHYLENE GLYCOL (MIRALAX / GLYCOLAX) 17 G PACKET    Take 17 g by mouth 2 (two) times daily.   VITAMIN B-12 (CYANOCOBALAMIN) 500 MCG TABLET    Take 1 tablet (500 mcg total) by mouth daily.  Modified Medications   No medications on file  Discontinued Medications   TRAMADOL (ULTRAM) 50 MG TABLET    Take 1 tablet (50 mg total) by mouth every 6 (six) hours as needed for severe pain. Do not take and drive      Past Medical History:  Diagnosis Date   Cancer ([place])    Depression    Dysfunctional uterine bleeding    Pelvic mass in female    Retroperitoneal lymphadenopathy    Symptomatic anemia     Social History   Tobacco Use   Smoking status: Former    Packs/day: 1.00    Years: 33.00    Pack years: 33.00    Types: Cigarettes    Quit date: 10/09/2020    Years since quitting: 0.6   Smokeless tobacco: Never  Vaping Use   Vaping Use: Never used  Substance Use Topics   Alcohol use: No  Drug use: No    Family History  Problem Relation Age of Onset   Hypertension Mother     Allergies  Allergen Reactions   Clindamycin/Lincomycin Anaphylaxis and Swelling    Review of Systems  All other systems reviewed and are negative.  Except as noted above.   OBJECTIVE:    Vitals:   06/03/21 1422  BP: 131/87  Pulse: 76  Temp: 98.2 F (36.8 C)  TempSrc: Oral  Weight: 196 lb (88.9 kg)   Body mass index is 26.58 kg/m.  Physical Exam Constitutional:      General: She is not in acute distress.    Appearance: Normal appearance.  HENT:     Head: Normocephalic and atraumatic.  Eyes:     Extraocular Movements: Extraocular movements intact.     Conjunctiva/sclera: Conjunctivae normal.  Pulmonary:     Effort: Pulmonary effort is normal. No respiratory distress.  Abdominal:     General: Abdomen is flat. There is no distension.     Tenderness: There is no abdominal tenderness.   Musculoskeletal:     Cervical back: Normal range of motion and neck supple.  Skin:    General: Skin is warm and dry.  Neurological:     General: No focal deficit present.     Mental Status: She is alert and oriented to person, place, and time.  Psychiatric:        Mood and Affect: Mood normal.        Behavior: Behavior normal.     Labs and Microbiology: CBC Latest Ref Rng & Units 05/19/2021 04/30/2021 04/21/2021  WBC 4.0 - 10.5 K/uL 8.8 5.8 8.2  Hemoglobin 12.0 - 15.0 g/dL 11.4(L) 10.7(L) 9.9(L)  Hematocrit 36.0 - 46.0 % 34.5(L) 34.8(L) 31.6(L)  Platelets 150 - 400 K/uL 187 298 512(H)   CMP Latest Ref Rng & Units 04/30/2021 04/21/2021 04/11/2021  Glucose 70 - 99 mg/dL 84 85 101(H)  BUN 6 - 20 mg/dL 6 6 <5(L)  Creatinine 0.44 - 1.00 mg/dL 0.67 0.76 0.74  Sodium 135 - 145 mmol/L 136 138 133(L)  Potassium 3.5 - 5.1 mmol/L 3.8 4.2 4.1  Chloride 98 - 111 mmol/L 103 99 100  CO2 22 - 32 mmol/L 26 29 26   Calcium 8.9 - 10.3 mg/dL 9.1 9.6 8.6(L)  Total Protein 6.5 - 8.1 g/dL 8.9(H) 9.9(H) 7.9  Total Bilirubin 0.3 - 1.2 mg/dL 0.5 <0.2(L) 0.3  Alkaline Phos 38 - 126 U/L 41 61 95  AST 15 - 41 U/L 19 17 31   ALT 0 - 44 U/L 12 9 30        ASSESSMENT & PLAN:    1. Pelvic mass  2. Adenopathy  3. Splenomegaly   Patient with prior presentation in late 2022 that noted cystic mass in cul-de-sac favored to be infected based on interval imaging improvement with prolonged antibiotic course (5 weeks total).  Imaging also noted retroperitoneal adenopathy and splenomegaly.  Repeat imaging has shown improving pelvic mass and aspiration of collection was sterile (cultures obtained off antibiotics for approximately 3 days).  Agree with obtaining interval imaging in March to reassess prior abnormalities.  She is doing well off antibiotics for the past 3 weeks so I think it is reasonable to continue to watch her off antibiotics.  Return precautions discussed and will have her follow up in March following  her repeat CT scan, but sooner if needed.    Raynelle [facility] for [facility] 06/03/2021, 2:33 PM

## 2021-06-05 ENCOUNTER — Encounter: Payer: PRIVATE HEALTH INSURANCE | Admitting: Family Medicine

## 2021-06-06 ENCOUNTER — Other Ambulatory Visit (INDEPENDENT_AMBULATORY_CARE_PROVIDER_SITE_OTHER): Payer: Self-pay | Admitting: Primary Care

## 2021-06-06 DIAGNOSIS — F411 Generalized anxiety disorder: Secondary | ICD-10-CM

## 2021-06-06 NOTE — Telephone Encounter (Signed)
Refilled 04/17/2021 3180 with 1 refill. Requested Prescriptions  Pending Prescriptions Disp Refills   busPIRone (BUSPAR) 5 MG tablet [Pharmacy Med Name: BUSPIRONE 5MG  TABLETS] 60 tablet     Sig: TAKE 1 TABLET(5 MG) BY MOUTH TWICE DAILY     Psychiatry: Anxiolytics/Hypnotics - Non-controlled Passed - 06/06/2021  3:19 AM      Passed - Valid encounter within last 6 months    Recent Outpatient Visits          1 month ago Generalized anxiety disorder   [place], Michelle P, NP   3 months ago Screening for diabetes mellitus   [place], Michelle P, NP      Future Appointments            In 1 month Oletta Lamas, Milford Cage, NP [facility]   In 2 months Mignon [place], [facility] Infect[facility] Disease, [facility]

## 2021-06-25 ENCOUNTER — Other Ambulatory Visit: Payer: Self-pay

## 2021-06-25 ENCOUNTER — Ambulatory Visit (HOSPITAL_COMMUNITY)
Admission: RE | Admit: 2021-06-25 | Discharge: 2021-06-25 | Disposition: A | Discharge status: Home / Self Care | Payer: PRIVATE HEALTH INSURANCE | Source: Ambulatory Visit | Attending: Gynecologic Oncology | Admitting: Gynecologic Oncology

## 2021-06-25 DIAGNOSIS — R1900 Intra-abdominal and pelvic swelling, mass and lump, unspecified site: Secondary | ICD-10-CM | POA: Diagnosis present

## 2021-06-29 ENCOUNTER — Encounter (HOSPITAL_COMMUNITY): Payer: Self-pay

## 2021-06-29 ENCOUNTER — Other Ambulatory Visit: Payer: Self-pay

## 2021-06-29 ENCOUNTER — Emergency Department (HOSPITAL_COMMUNITY)
Admission: EM | Admit: 2021-06-29 | Discharge: 2021-06-29 | Disposition: A | Discharge status: Home / Self Care | Payer: PRIVATE HEALTH INSURANCE | Attending: Emergency Medicine | Admitting: Emergency Medicine

## 2021-06-29 DIAGNOSIS — L03113 Cellulitis of right upper limb: Secondary | ICD-10-CM | POA: Diagnosis not present

## 2021-06-29 DIAGNOSIS — M79601 Pain in right arm: Secondary | ICD-10-CM | POA: Diagnosis present

## 2021-06-29 MED ORDER — HYDROCODONE-ACETAMINOPHEN 5-325 MG PO TABS: 1.0000 | ORAL_TABLET | Freq: Four times a day (QID) | ORAL | 0 refills | Status: DC | PRN

## 2021-06-29 MED ORDER — HYDROCODONE-ACETAMINOPHEN 5-325 MG PO TABS
2.0000 | ORAL_TABLET | Freq: Once | ORAL | Status: AC
  Administered 2021-06-29: 2 via ORAL
  Filled 2021-06-29: qty 2

## 2021-06-29 MED ORDER — DOXYCYCLINE HYCLATE 100 MG PO TABS
100.0000 mg | ORAL_TABLET | Freq: Once | ORAL | Status: AC
  Administered 2021-06-29: 100 mg via ORAL
  Filled 2021-06-29: qty 1

## 2021-06-29 MED ORDER — DOXYCYCLINE HYCLATE 100 MG PO CAPS: 100.0000 mg | ORAL_CAPSULE | Freq: Two times a day (BID) | ORAL | 0 refills | Status: DC

## 2021-06-29 NOTE — Discharge Instructions (Signed)
Return to the hospital tomorrow after 8am.  Go to the registration desk and tell them you've arrived for an ultrasound of your right arm to rule out blood clot.

## 2021-06-29 NOTE — ED Provider Notes (Signed)
[facility] Emergency Department Provider Note MRN:  [id number]  Arrival date & time: 06/29/21     Chief Complaint   Rash and Abscess   History of Present Illness   Caitlin Maddox is a 41 y.o. year-old female presents to the ED with chief complaint of right arm pain.  She states that she had a small pustule under her right armpit a few days ago.  She states that she squeezed it, and now she has had swelling and redness that have extended out onto her right upper arm.  She states she may have had a fever earlier today, but denies any fever now.  Denies nausea or vomiting.  Denies any treatment prior to arrival.  She states that the area is painful.Marland Kitchen  History provided by patient.  Review of Systems  Pertinent review of systems noted in HPI.    Physical Exam   Vitals:   06/29/21 0315 06/29/21 0345  BP: 113/83 109/68  Pulse: 64 74  Resp: 18 16  Temp:  97.9 F (36.6 C)  SpO2: 100% 99%    CONSTITUTIONAL: Nontoxic-appearing, NAD NEURO:  Alert and oriented x 3, CN 3-12 grossly intact EYES:  eyes equal and reactive ENT/NECK:  Supple, no stridor  CARDIO:  normal rate, appears well-perfused  PULM:  No respiratory distress,  GI/GU:  non-distended,  MSK/SPINE:  No gross deformities, no edema, moves all extremities  SKIN: There is erythema to the right upper extremity as pictured below consistent with cellulitis, no palpable abscess    *Additional and/or pertinent findings included in MDM below  Diagnostic and Interventional Summary    EKG Interpretation  Date/Time:    Ventricular Rate:    PR Interval:    QRS Duration:   QT Interval:    QTC Calculation:   R Axis:     Text Interpretation:         Labs Reviewed - No data to display  US Venous Img Upper Uni Right(DVT)    (Results Pending)    Medications  doxycycline (VIBRA-TABS) tablet 100 mg (100 mg Oral Given 06/29/21 0332)  HYDROcodone-acetaminophen (NORCO/VICODIN) 5-325 MG per tablet 2 tablet  (2 tablets Oral Given 06/29/21 5361)     Procedures  /  Critical Care Procedures  ED Course and Medical Decision Making  I have reviewed the triage vital signs, the nursing notes, and pertinent available records from the EMR.  Complexity of Problems Addressed: High Complexity: Acute illness/injury posing a threat to life or bodily function, requiring emergent diagnostic workup, evaluation, and treatment as below.  ED Course:    After considering the following differential, abscess, cellulitis, DVT, phlebitis, I ordered outpatient right upper extremity ultrasound to rule out DVT, treatment in ED with doxycycline and Vicodin for pain.  Patient was admitted to the hospital recently and also had a PICC line, but I doubt that this is complication from that.  I discussed this with Dr. Roslynn Amble, who agrees that the timing would be unlikely for infection.  Recommendation is for right upper extremity ultrasound, which is not available at this time.  I do believe patient's symptoms are most likely caused by infection and cellulitis.  Social Determinants Affecting Care: No clinically significant social determinants affecting this chief complaint.    Consultants: No consultations were needed in caring for this patient.   Treatment and Plan:  Doxy for cellulitis.  Outpatient RUE DVT study.  PCP follow-up.  Return precautions discussed.  I considered admission due to patient's initial presentation,  but after considering the examination, patient will not require admission and can be discharged with outpatient follow-up.  Feel that initial treatment on outpatient abx is appropriate.    Final Clinical Impressions(s) / ED Diagnoses     ICD-10-CM   1. Cellulitis of right upper extremity  L03.113       ED Discharge Orders          Ordered    US Venous Img Upper Uni Right(DVT)        06/29/21 0311    HYDROcodone-acetaminophen (NORCO/VICODIN) 5-325 MG tablet  Every 6 hours PRN,   Status:   Discontinued        06/29/21 0350    doxycycline (VIBRAMYCIN) 100 MG capsule  2 times daily        06/29/21 0350    HYDROcodone-acetaminophen (NORCO/VICODIN) 5-325 MG tablet  Every 6 hours PRN        06/29/21 0351             Discharge Instructions Discussed with and Provided to Patient:     Discharge Instructions      Return to the hospital tomorrow after 8am.  Go to the registration desk and tell them you've arrived for an ultrasound of your right arm to rule out blood clot.       Montine Circle, PA-C 06/29/21 5830    Lucrezia Starch, MD 06/30/21 1256

## 2021-06-29 NOTE — ED Triage Notes (Signed)
Pt c/o abscess under her right arm. Area is red and pt states it is painful.

## 2021-07-02 ENCOUNTER — Encounter: Payer: PRIVATE HEALTH INSURANCE | Admitting: Obstetrics & Gynecology

## 2021-07-16 ENCOUNTER — Other Ambulatory Visit: Payer: Self-pay

## 2021-07-16 ENCOUNTER — Ambulatory Visit (INDEPENDENT_AMBULATORY_CARE_PROVIDER_SITE_OTHER): Payer: PRIVATE HEALTH INSURANCE | Admitting: Primary Care

## 2021-07-16 ENCOUNTER — Encounter (INDEPENDENT_AMBULATORY_CARE_PROVIDER_SITE_OTHER): Payer: Self-pay | Admitting: Primary Care

## 2021-07-16 VITALS — BP 131/85 | HR 74 | Temp 97.9°F | Ht 72.0 in | Wt 201.6 lb

## 2021-07-16 DIAGNOSIS — F411 Generalized anxiety disorder: Secondary | ICD-10-CM | POA: Diagnosis not present

## 2021-07-16 DIAGNOSIS — H6121 Impacted cerumen, right ear: Secondary | ICD-10-CM | POA: Diagnosis not present

## 2021-07-16 DIAGNOSIS — D500 Iron deficiency anemia secondary to blood loss (chronic): Secondary | ICD-10-CM

## 2021-07-16 DIAGNOSIS — Z760 Encounter for issue of repeat prescription: Secondary | ICD-10-CM

## 2021-07-16 MED ORDER — BUSPIRONE HCL 5 MG PO TABS: 5.0000 mg | ORAL_TABLET | Freq: Two times a day (BID) | ORAL | 1 refills | Status: DC

## 2021-07-16 NOTE — Patient Instructions (Signed)
Anemia ?Anemia is a condition in which there is not enough red blood cells or hemoglobin in the blood. Hemoglobin is a substance in red blood cells that carries oxygen. ?When you do not have enough red blood cells or hemoglobin (are anemic), your body cannot get enough oxygen and your organs may not work properly. As a result, you may feel very tired or have other problems. ?What are the causes? ?Common causes of anemia include: ?Excessive bleeding. Anemia can be caused by excessive bleeding inside or outside the body, including bleeding from the intestines or from heavy menstrual periods in females. ?Poor nutrition. ?Long-lasting (chronic) kidney, thyroid, and liver disease. ?Bone marrow disorders, spleen problems, and blood disorders. ?Cancer and treatments for cancer. ?HIV (human immunodeficiency virus) and AIDS (acquired immunodeficiency syndrome). ?Infections, medicines, and autoimmune disorders that destroy red blood cells. ?What are the signs or symptoms? ?Symptoms of this condition include: ?Minor weakness. ?Dizziness. ?Headache, or difficulties concentrating and sleeping. ?Heartbeats that feel irregular or faster than normal (palpitations). ?Shortness of breath, especially with exercise. ?Pale skin, lips, and nails, or cold hands and feet. ?Indigestion and nausea. ?Symptoms may occur suddenly or develop slowly. If your anemia is mild, you may not have symptoms. ?How is this diagnosed? ?This condition is diagnosed based on blood tests, your medical history, and a physical exam. In some cases, a test may be needed in which cells are removed from the soft tissue inside of a bone and looked at under a microscope (bone marrow biopsy). Your health care provider may also check your stool (feces) for blood and may do additional testing to look for the cause of your bleeding. ?Other tests may include: ?Imaging tests, such as a CT scan or MRI. ?A procedure to see inside your esophagus and stomach (endoscopy). ?A  procedure to see inside your colon and rectum (colonoscopy). ?How is this treated? ?Treatment for this condition depends on the cause. If you continue to lose a lot of blood, you may need to be treated at a hospital. Treatment may include: ?Taking supplements of iron, vitamin E09, or folic acid. ?Taking a hormone medicine (erythropoietin) that can help to stimulate red blood cell growth. ?Having a blood transfusion. This may be needed if you lose a lot of blood. ?Making changes to your diet. ?Having surgery to remove your spleen. ?Follow these instructions at home: ?Take over-the-counter and prescription medicines only as told by your health care provider. ?Take supplements only as told by your health care provider. ?Follow any diet instructions that you were given by your health care provider. ?Keep all follow-up visits as told by your health care provider. This is important. ?Contact a health care provider if: ?You develop new bleeding anywhere in the body. ?Get help right away if: ?You are very weak. ?You are short of breath. ?You have pain in your abdomen or chest. ?You are dizzy or feel faint. ?You have trouble concentrating. ?You have bloody stools, black stools, or tarry stools. ?You vomit repeatedly or you vomit up blood. ?These symptoms may represent a serious problem that is an emergency. Do not wait to see if the symptoms will go away. Get medical help right away. Call your local emergency services (911 in the [place] Do not drive yourself to the hospital. ?Summary ?Anemia is a condition in which you do not have enough red blood cells or enough of a substance in your red blood cells that carries oxygen (hemoglobin). ?Symptoms may occur suddenly or develop slowly. ?If your anemia is  mild, you may not have symptoms. ?This condition is diagnosed with blood tests, a medical history, and a physical exam. Other tests may be needed. ?Treatment for this condition depends on the cause of the anemia. ?This  information is not intended to replace advice given to you by your health care provider. Make sure you discuss any questions you have with your health care provider. ?Document Revised: 04/04/2019 Document Reviewed: 04/04/2019 ?Elsevier Patient Education ? 2022 [employer] ? ?

## 2021-07-16 NOTE — Progress Notes (Signed)
? ? ?   [place] ? ?Subjective: ?  ?  ? Caitlin Maddox is a 41 y.o. female who presents for follow up of anxiety disorder. She has the following anxiety symptoms: difficulty concentrating, fatigue, insomnia, irritable, and palpitations. Onset of symptoms was approximately 6 months ago. Symptoms have been gradually improving since that time. She denies current suicidal and homicidal ideation. Family history significant for anxiety and depression. Risk factors: positive family history in  brother(s), parents, and sister(s). Previous treatment includes BuSpar. She complains of the following medication side effects: none. ?The following portions of the patient's history were reviewed and updated as appropriate: allergies, current medications, past family history, past medical history, past social history, and past surgical history. ? ?Review of Systems ?Pertinent items noted in HPI and remainder of comprehensive ROS otherwise negative.  ?  ?Objective:  ?BP 131/85 (BP Location: Right Arm, Patient Position: Sitting, Cuff Size: Normal)   Pulse 74   Temp 97.9 ?F (36.6 ?C) (Oral)   Ht 6' (1.829 m)   Wt 201 lb 9.6 oz (91.4 kg)   LMP 07/15/2021 (Exact Date)   SpO2 99%   BMI 27.34 kg/m?  ? ? BP 131/85 (BP Location: Right Arm, Patient Position: Sitting, Cuff Size: Normal)   Pulse 74   Temp 97.9 ?F (36.6 ?C) (Oral)   Ht 6' (1.829 m)   Wt 201 lb 9.6 oz (91.4 kg)   LMP 07/15/2021 (Exact Date)   SpO2 99%   BMI 27.34 kg/m?  ?General appearance: alert, cooperative, appears stated age, and no distress ?Head: Normocephalic, without obvious abnormality, atraumatic, right ear cerumen impaction  ?Eyes: conjunctivae/corneas clear. PERRL, EOM's intact. Fundi benign. ?Nose: Nares normal. Septum midline. Mucosa normal. No drainage or sinus tenderness. ?Neck: no adenopathy, no carotid bruit, no JVD, supple, symmetrical, trachea midline, and thyroid not enlarged, symmetric, no tenderness/mass/nodules ?Lungs: clear  to auscultation bilaterally ?Heart: regular rate and rhythm, S1, S2 normal, no murmur, click, rub or gallop ?Abdomen: soft, non-tender; bowel sounds normal; no masses,  no organomegaly ?Extremities: extremities normal, atraumatic, no cyanosis or edema ?Skin: Skin color, texture, turgor normal. No rashes or lesions  ?  ? ?Caitlin Maddox was seen today for follow-up. ? ?Diagnoses and all orders for this visit: ? ?Generalized anxiety disorder ?Patient is very please with the response of the medication feels it is working well and does not want to run out. ?-     busPIRone (BUSPAR) 5 MG tablet; Take 1 tablet (5 mg total) by mouth 2 (two) times daily. ? ?Anemia due to blood loss, chronic ?-     CBC with Differential/Platelet; Future ? ?Impacted cerumen of right ear ?Return for ear irrigation ? ?Medication refill ?busPIRone (BUSPAR) 5 MG tablet  #180 1 refill ?  ?This note has been created with Surveyor, quantity. Any transcriptional errors are unintentional.  ? ?Kerin Perna, NP ?07/16/2021, 10:09 AM   ?

## 2021-08-07 ENCOUNTER — Ambulatory Visit (HOSPITAL_COMMUNITY): Payer: PRIVATE HEALTH INSURANCE

## 2021-08-11 ENCOUNTER — Inpatient Hospital Stay: Payer: PRIVATE HEALTH INSURANCE | Admitting: Gynecologic Oncology

## 2021-08-13 ENCOUNTER — Ambulatory Visit: Payer: PRIVATE HEALTH INSURANCE | Admitting: Internal Medicine

## 2021-08-13 NOTE — Progress Notes (Deleted)
?  ? ? ? ? ?[facility] for [facility] ? ?CHIEF COMPLAINT:   ? ?Follow up for pelvic mass ? ?SUBJECTIVE:   ? ?Caitlin Maddox is a 41 y.o. female with PMHx as below who presents to the clinic for pelvic mass.  ? ?Patient has continued to do well off antibiotics at this time since I saw her last in January.  She had a follow up ultrasound done in mid February on 06/25/21 that showed resolution of complex fluid collection seen on prior exam, likely an abscess that was resolved.  ? ? ?From prior visit 06/03/21:  ?Patient was seen by my partner, Dr Tommy Medal, in December 2022 for pelvic mass concerning for abscess in the setting of several months abnormal uterine bleeding.  She was treated with 2 weeks of ceftriaxone and flagyl.  She saw Dr Tommy Medal on 04/21/21 who transitioned her to cefdinir and flagyl for 3 additional weeks of therapy so that she got 5 total weeks of antibiotics.  She has also been following with Dr Berline Lopes of [facility] and has had interval imaging plus percutaneous aspiration of her cystic mass. ? ?Repeat imaging has shown interval decrease in size of pelvic fluid collection.  Most recent imaging 05/19/21 measured this as 5.1*3.7cm.  She underwent IR aspiration on that date as well which showed 30cc of serous fluid.  Cultures and gram stain negative.  No malignant cells noted.  She has now been off antibiotics since 05/16/21.  She saw OB yesterday and the plan is for a repeat CT scan in the next couple months (Scheduled for 08/07/21).  She is doing well off antibiotics.  No fevers, chills.  No GI symptoms, no abdominal pain.  She tolerated antibiotics when she took them.   ? ?Please see A&P for the details of today's visit and status of the patient's medical problems.  ? ?Patient's Medications  ?New Prescriptions  ? No medications on file  ?Previous Medications  ? BUSPIRONE (BUSPAR) 5 MG TABLET    Take 1 tablet (5 mg total) by mouth 2 (two) times daily.  ? DOCUSATE SODIUM (COLACE) 100 MG CAPSULE    Take  100 mg by mouth 2 (two) times daily.  ? FERROUS SULFATE (FERROUSUL) 325 (65 FE) MG TABLET    Take 1 tablet (325 mg total) by mouth 2 (two) times daily.  ? IBUPROFEN (ADVIL) 200 MG TABLET    Take 600 mg by mouth every 6 (six) hours as needed for mild pain, headache or cramping.  ? POLYETHYLENE GLYCOL (MIRALAX / GLYCOLAX) 17 G PACKET    Take 17 g by mouth 2 (two) times daily.  ? VITAMIN B-12 (CYANOCOBALAMIN) 500 MCG TABLET    Take 1 tablet (500 mcg total) by mouth daily.  ?Modified Medications  ? No medications on file  ?Discontinued Medications  ? No medications on file  ?   ? ?Past Medical History:  ?Diagnosis Date  ? Cancer [facility])   ? Depression   ? Dysfunctional uterine bleeding   ? Pelvic mass in female   ? Retroperitoneal lymphadenopathy   ? Symptomatic anemia   ? ? ?Social History  ? ?Tobacco Use  ? Smoking status: Former  ?  Packs/day: 1.00  ?  Years: 33.00  ?  Pack years: 33.00  ?  Types: Cigarettes  ?  Quit date: 10/09/2020  ?  Years since quitting: 0.8  ? Smokeless tobacco: Never  ?Vaping Use  ? Vaping Use: Never used  ?Substance Use  Topics  ? Alcohol use: No  ? Drug use: No  ? ? ?Family History  ?Problem Relation Age of Onset  ? Hypertension Mother   ? ? ?Allergies  ?Allergen Reactions  ? Clindamycin/Lincomycin Anaphylaxis and Swelling  ? ? ?ROS   ? ? ?OBJECTIVE:   ? ?There were no vitals filed for this visit. ? ?There is no height or weight on file to calculate BMI. ? ?Physical Exam ? ? ?Labs and Microbiology: ? ?  Latest Ref Rng & Units 05/19/2021  ?  8:22 AM 04/30/2021  ? 12:56 PM 04/21/2021  ?  1:48 PM  ?CBC  ?WBC 4.0 - 10.5 K/uL 8.8   5.8   8.2    ?Hemoglobin 12.0 - 15.0 g/dL 11.4   10.7   9.9    ?Hematocrit 36.0 - 46.0 % 34.5   34.8   31.6    ?Platelets 150 - 400 K/uL 187   298   512    ? ? ?  Latest Ref Rng & Units 04/30/2021  ? 12:56 PM 04/21/2021  ?  1:48 PM 04/11/2021  ?  4:46 AM  ?CMP  ?Glucose 70 - 99 mg/dL 84   85   101    ?BUN 6 - 20 mg/dL 6   6   <5    ?Creatinine 0.44 - 1.00 mg/dL 0.67   0.76    0.74    ?Sodium 135 - 145 mmol/L 136   138   133    ?Potassium 3.5 - 5.1 mmol/L 3.8   4.2   4.1    ?Chloride 98 - 111 mmol/L 103   99   100    ?CO2 22 - 32 mmol/L '26   29   26    '$ ?Calcium 8.9 - 10.3 mg/dL 9.1   9.6   8.6    ?Total Protein 6.5 - 8.1 g/dL 8.9   9.9   7.9    ?Total Bilirubin 0.3 - 1.2 mg/dL 0.5   <0.2   0.3    ?Alkaline Phos 38 - 126 U/L 41   61   95    ?AST 15 - 41 U/L '19   17   31    '$ ?ALT 0 - 44 U/L '12   9   30    '$ ?  ? ? ? ?ASSESSMENT & PLAN:   ? ? ?1. Pelvic abscess in female ?*** ? ? ?Patient with prior presentation in late 2022 that noted cystic mass in cul-de-sac favored to be infected based on interval imaging improvement with prolonged antibiotic course (5 weeks total).  Imaging also noted retroperitoneal adenopathy and splenomegaly.  Repeat imaging has shown improving pelvic mass and aspiration of collection was sterile (cultures obtained off antibiotics for approximately 3 days).  Agree with obtaining interval imaging in March to reassess prior abnormalities.  She is doing well off antibiotics for the past 3 weeks so I think it is reasonable to continue to watch her off antibiotics.  Return precautions discussed and will have her follow up in March following her repeat CT scan, but sooner if needed.  ? ? ?Mignon Pine ?[place] [facility] Infectious Disease ?[facility] ?08/13/2021, 2:00 PM ? ? ? ?

## 2021-08-14 ENCOUNTER — Ambulatory Visit (INDEPENDENT_AMBULATORY_CARE_PROVIDER_SITE_OTHER): Payer: PRIVATE HEALTH INSURANCE | Admitting: Primary Care

## 2021-08-26 ENCOUNTER — Telehealth: Payer: Self-pay | Admitting: *Deleted

## 2021-08-26 NOTE — Telephone Encounter (Signed)
Attempted to reach the patient to reschedule her missed CT scan, LMOM to call the office back  ?

## 2021-08-29 NOTE — Telephone Encounter (Signed)
Attempted to reach the patient to reschedule her missed CT scan, LMOM to call the office back  ?

## 2021-09-01 NOTE — Telephone Encounter (Signed)
Attempted to reach the patient to reschedule her missed CT scan, LMOM to call the office back  ?

## 2021-09-11 ENCOUNTER — Telehealth: Payer: Self-pay | Admitting: *Deleted

## 2021-09-11 NOTE — Telephone Encounter (Signed)
Per patient request scheduled her missed CT scan and called the patient with the appt date/time/instructions for 5/9 at 430 pm  ?

## 2021-09-16 ENCOUNTER — Ambulatory Visit (HOSPITAL_COMMUNITY)
Admission: RE | Admit: 2021-09-16 | Discharge: 2021-09-16 | Disposition: A | Discharge status: Home / Self Care | Payer: PRIVATE HEALTH INSURANCE | Source: Ambulatory Visit | Attending: Gynecologic Oncology | Admitting: Gynecologic Oncology

## 2021-09-16 DIAGNOSIS — R591 Generalized enlarged lymph nodes: Secondary | ICD-10-CM | POA: Diagnosis present

## 2021-09-16 DIAGNOSIS — R161 Splenomegaly, not elsewhere classified: Secondary | ICD-10-CM | POA: Diagnosis present

## 2021-09-16 DIAGNOSIS — R1900 Intra-abdominal and pelvic swelling, mass and lump, unspecified site: Secondary | ICD-10-CM | POA: Diagnosis present

## 2021-09-16 MED ORDER — IOHEXOL 300 MG/ML  SOLN
100.0000 mL | Freq: Once | INTRAMUSCULAR | Status: AC | PRN
  Administered 2021-09-16: 100 mL via INTRAVENOUS

## 2021-09-23 ENCOUNTER — Telehealth: Payer: Self-pay

## 2021-09-23 ENCOUNTER — Ambulatory Visit (INDEPENDENT_AMBULATORY_CARE_PROVIDER_SITE_OTHER): Payer: Self-pay

## 2021-09-23 NOTE — Telephone Encounter (Signed)
Pt called office stating she had a CT scan on 5/9 ordered by Dr.Tucker. She states she was fine that day but on Wednesday she has had painful bilateral leg/foot swelling.She thinks it may be from the contrast or IV dye. She states no redness/heat but hurts to walk and painful to touch. Pt states she has not experienced this before.Advised pt to prop legs up as much as possible. I also called radiology department to check and see if this could be a common side effect of the contrast. Megan (CT tech) stated no, usually pt may have hives/rash.  I spoke to Joylene John NP, she recommends Caitlin Maddox go to urgent care if doesn't get better or seems to get worse in next day or so. Pt verbalized an understanding. KJ CMA ?

## 2021-09-23 NOTE — Telephone Encounter (Signed)
? ? ?  Chief Complaint: Swelling to feet and legs ?Symptoms: Painful ?Frequency: Last week after she had contrast for CT Scan ?Pertinent Negatives: Patient denies any other symptoms. ?Disposition: '[]'$ ED /'[]'$ Urgent Care (no appt availability in office) / '[]'$ Appointment(In office/virtual)/ '[]'$  [facility]/ '[]'$ Home Care/ '[]'$ Refused Recommended Disposition /'[]'$ [place] [facility]/ '[x]'$  Follow-up with PCP ?Additional Notes: No availability in the practice this week per [facility] in the practice. She recommends pt. Call Dr. Berline Lopes who ordered CT or go to ED. Pt. Verbalizes understanding.  ?Answer Assessment - Initial Assessment Questions ?1. ONSET: "When did the swelling start?" (e.g., minutes, hours, days) ?    Last Wednesday ?2. LOCATION: "What part of the leg is swollen?"  "Are both legs swollen or just one leg?" ?    Both legs and feet ?3. SEVERITY: "How bad is the swelling?" (e.g., localized; mild, moderate, severe) ? - Localized - small area of swelling localized to one leg ? - MILD pedal edema - swelling limited to foot and ankle, pitting edema < 1/4 inch (6 mm) deep, rest and elevation eliminate most or all swelling ? - MODERATE edema - swelling of lower leg to knee, pitting edema > 1/4 inch (6 mm) deep, rest and elevation only partially reduce swelling ? - SEVERE edema - swelling extends above knee, facial or hand swelling present  ?    Moderate ?4. REDNESS: "Does the swelling look red or infected?" ?    No ?5. PAIN: "Is the swelling painful to touch?" If Yes, ask: "How painful is it?"   (Scale 1-10; mild, moderate or severe) ?    9 ?6. FEVER: "Do you have a fever?" If Yes, ask: "What is it, how was it measured, and when did it start?"  ?    No ?7. CAUSE: "What do you think is causing the leg swelling?" ?    After she took contrast last week ?8. MEDICAL HISTORY: "Do you have a history of heart failure, kidney disease, liver failure, or cancer?" ?    No ?9. RECURRENT SYMPTOM: "Have you had leg swelling  before?" If Yes, ask: "When was the last time?" "What happened that time?" ?    No ?10. OTHER SYMPTOMS: "Do you have any other symptoms?" (e.g., chest pain, difficulty breathing) ?      No ?11. PREGNANCY: "Is there any chance you are pregnant?" "When was your last menstrual period?" ?      No ? ?Protocols used: Leg Swelling and Edema-A-AH ? ?

## 2022-05-21 ENCOUNTER — Emergency Department (HOSPITAL_COMMUNITY)
Admission: EM | Admit: 2022-05-21 | Discharge: 2022-05-21 | Disposition: A | Discharge status: Home / Self Care | Payer: Self-pay | Attending: Emergency Medicine | Admitting: Emergency Medicine

## 2022-05-21 ENCOUNTER — Emergency Department (HOSPITAL_COMMUNITY): Payer: Self-pay

## 2022-05-21 ENCOUNTER — Encounter (HOSPITAL_COMMUNITY): Payer: Self-pay

## 2022-05-21 ENCOUNTER — Other Ambulatory Visit: Payer: Self-pay

## 2022-05-21 DIAGNOSIS — R1030 Lower abdominal pain, unspecified: Secondary | ICD-10-CM

## 2022-05-21 DIAGNOSIS — D259 Leiomyoma of uterus, unspecified: Secondary | ICD-10-CM | POA: Insufficient documentation

## 2022-05-21 DIAGNOSIS — R110 Nausea: Secondary | ICD-10-CM | POA: Insufficient documentation

## 2022-05-21 DIAGNOSIS — D219 Benign neoplasm of connective and other soft tissue, unspecified: Secondary | ICD-10-CM

## 2022-05-21 LAB — CBC WITH DIFFERENTIAL/PLATELET
Abs Immature Granulocytes: 0.03 10*3/uL (ref 0.00–0.07)
Basophils Absolute: 0.1 10*3/uL (ref 0.0–0.1)
Basophils Relative: 1 %
Eosinophils Absolute: 0 10*3/uL (ref 0.0–0.5)
Eosinophils Relative: 0 %
HCT: 29.3 % — ABNORMAL LOW (ref 36.0–46.0)
Hemoglobin: 8 g/dL — ABNORMAL LOW (ref 12.0–15.0)
Immature Granulocytes: 0 %
Lymphocytes Relative: 29 %
Lymphs Abs: 2.2 10*3/uL (ref 0.7–4.0)
MCH: 18.7 pg — ABNORMAL LOW (ref 26.0–34.0)
MCHC: 27.3 g/dL — ABNORMAL LOW (ref 30.0–36.0)
MCV: 68.5 fL — ABNORMAL LOW (ref 80.0–100.0)
Monocytes Absolute: 0.7 10*3/uL (ref 0.1–1.0)
Monocytes Relative: 9 %
Neutro Abs: 4.6 10*3/uL (ref 1.7–7.7)
Neutrophils Relative %: 61 %
Platelets: 284 10*3/uL (ref 150–400)
RBC: 4.28 MIL/uL (ref 3.87–5.11)
RDW: 21.3 % — ABNORMAL HIGH (ref 11.5–15.5)
WBC: 7.6 10*3/uL (ref 4.0–10.5)
nRBC: 0 % (ref 0.0–0.2)

## 2022-05-21 LAB — URINALYSIS, ROUTINE W REFLEX MICROSCOPIC
Bilirubin Urine: NEGATIVE
Glucose, UA: NEGATIVE mg/dL
Ketones, ur: NEGATIVE mg/dL
Nitrite: NEGATIVE
Protein, ur: NEGATIVE mg/dL
Specific Gravity, Urine: 1.009 (ref 1.005–1.030)
pH: 7 (ref 5.0–8.0)

## 2022-05-21 LAB — I-STAT BETA HCG BLOOD, ED (MC, WL, AP ONLY): I-stat hCG, quantitative: <5 m[IU]/mL (ref <5)

## 2022-05-21 LAB — COMPREHENSIVE METABOLIC PANEL
ALT: 19 U/L (ref 0–44)
AST: 22 U/L (ref 15–41)
Albumin: 4.4 g/dL (ref 3.5–5.0)
Alkaline Phosphatase: 50 U/L (ref 38–126)
Anion gap: 8 (ref 5–15)
BUN: 7 mg/dL (ref 6–20)
CO2: 22 mmol/L (ref 22–32)
Calcium: 9.5 mg/dL (ref 8.9–10.3)
Chloride: 103 mmol/L (ref 98–111)
Creatinine, Ser: 0.92 mg/dL (ref 0.44–1.00)
GFR, Estimated: >60 mL/min (ref >60)
Glucose, Bld: 98 mg/dL (ref 70–99)
Potassium: 3.6 mmol/L (ref 3.5–5.1)
Sodium: 133 mmol/L — ABNORMAL LOW (ref 135–145)
Total Bilirubin: 0.2 mg/dL — ABNORMAL LOW (ref 0.3–1.2)
Total Protein: 9.2 g/dL — ABNORMAL HIGH (ref 6.5–8.1)

## 2022-05-21 LAB — I-STAT CHEM 8, ED
BUN: 6 mg/dL (ref 6–20)
Calcium, Ion: 1.18 mmol/L (ref 1.15–1.40)
Chloride: 104 mmol/L (ref 98–111)
Creatinine, Ser: 0.8 mg/dL (ref 0.44–1.00)
Glucose, Bld: 97 mg/dL (ref 70–99)
HCT: 29 % — ABNORMAL LOW (ref 36.0–46.0)
Hemoglobin: 9.9 g/dL — ABNORMAL LOW (ref 12.0–15.0)
Potassium: 3.8 mmol/L (ref 3.5–5.1)
Sodium: 140 mmol/L (ref 135–145)
TCO2: 22 mmol/L (ref 22–32)

## 2022-05-21 LAB — LIPASE, BLOOD: Lipase: 48 U/L (ref 11–51)

## 2022-05-21 MED ORDER — MORPHINE SULFATE (PF) 4 MG/ML IV SOLN
4.0000 mg | Freq: Once | INTRAVENOUS | Status: AC
  Administered 2022-05-21: 4 mg via INTRAVENOUS
  Filled 2022-05-21: qty 1

## 2022-05-21 MED ORDER — ONDANSETRON 4 MG PO TBDP: 4.0000 mg | ORAL_TABLET | Freq: Three times a day (TID) | ORAL | Status: DC | PRN

## 2022-05-21 MED ORDER — ONDANSETRON 8 MG PO TBDP: 8.0000 mg | ORAL_TABLET | Freq: Three times a day (TID) | ORAL | 0 refills | Status: DC | PRN

## 2022-05-21 MED ORDER — OXYCODONE-ACETAMINOPHEN 5-325 MG PO TABS: 5.0000 | ORAL_TABLET | ORAL | 0 refills | Status: DC | PRN

## 2022-05-21 MED ORDER — ACETAMINOPHEN 500 MG PO TABS: 1000.0000 mg | ORAL_TABLET | Freq: Once | ORAL | Status: DC

## 2022-05-21 MED ORDER — IOHEXOL 350 MG/ML SOLN
75.0000 mL | Freq: Once | INTRAVENOUS | Status: AC | PRN
  Administered 2022-05-21: 75 mL via INTRAVENOUS

## 2022-05-21 MED ORDER — KETOROLAC TROMETHAMINE 30 MG/ML IJ SOLN
30.0000 mg | Freq: Once | INTRAMUSCULAR | Status: AC
  Administered 2022-05-21: 30 mg via INTRAVENOUS
  Filled 2022-05-21: qty 1

## 2022-05-21 NOTE — ED Triage Notes (Addendum)
"  Left lower abdominal pain,moving in to left lower back. Pressure when I urinate, vomited last night, no appetite x 2 days" per pt "I had a mass drained from the same spot around a year ago and the pain feels like it did then" per pt

## 2022-05-21 NOTE — ED Notes (Signed)
Unsuccessful IV stick x2 at this time will ask another RN in the bay to attempt a stick

## 2022-05-21 NOTE — ED Notes (Signed)
Lab reports that they have just received the patient's lab work at this time and they will place them in process

## 2022-05-21 NOTE — ED Notes (Signed)
Provider notified of patient's request for pain meds

## 2022-05-21 NOTE — ED Provider Triage Note (Signed)
Emergency Medicine Provider Triage Evaluation Note  Caitlin Maddox , a 42 y.o. female  was evaluated in triage.  Pt complains of lower abdominal and pelvic pain.  Symptoms have been present since yesterday.  States she started menstrual cycle on Saturday and has had vaginal bleeding.  Symptoms feel similar to when she was diagnosed with a pelvic mass approximately 1 year ago.  She had a percutaneous drain placed at that time.  Rates the pain at a 7 out of 10.  Took Tylenol for this approximately 6 hours ago.  States it did not help with the pain.  Has subjective fevers and chills as well.  Denies dysuria, urgency, urgency.  States she feels pressure when she urinates  Review of Systems  Positive: As above Negative: As above  Physical Exam  Ht 6' (1.829 m)   Wt 86.2 kg   LMP 05/21/2022   BMI 25.77 kg/m  Gen:   Awake, no distress   Resp:  Normal effort  MSK:   Moves extremities without difficulty  Other:  Tenderness to palpation of the lower abdomen  Medical Decision Making  Medically screening exam initiated at 7:49 PM.  Appropriate orders placed.  [name] was informed that the remainder of the evaluation will be completed by another provider, this initial triage assessment does not replace that evaluation, and the importance of remaining in the ED until their evaluation is complete.  Labs and CT ordered due to history.  Patient brought back from triage. Triage vitals not taken   Roylene Reason, Hershal Coria 05/21/22 1949

## 2022-05-21 NOTE — ED Provider Notes (Signed)
[facility] EMERGENCY DEPARTMENT Provider Note   CSN: [id number] Arrival date & time: 05/21/22  1842     History  Chief Complaint  Patient presents with   Abdominal Pain   Caitlin Maddox is a 42 y.o. female PMH of cystic mass with aspiration in 05/2021 (negative for metastatic cells), abnormal uterine bleeding, chronic anemia here for lower abdominal pain that started yesterday.  She says that she had emesis yesterday night.  Has constipation at baseline.  Denies any dark tarry stools, blood in stool.  Denies any hematemesis.  She says she was recently on her period.  And she has very heavy periods.She says that she had a temperature to 100 yesterday but none today. She is feeling chills. She feels some pressure when urinating but denies dysuria, hematuria or frequency. She says she currently has nausea and pain which is causing her to not eat/drink well.   Abdominal Pain Associated symptoms: chills, constipation and nausea   Associated symptoms: no chest pain, no cough, no diarrhea, no dysuria, no fever, no hematuria, no shortness of breath, no sore throat, no vaginal bleeding, no vaginal discharge and no vomiting       Home Medications Prior to Admission medications   Medication Sig Start Date End Date Taking? Authorizing Provider  ferrous sulfate (FERROUSUL) 325 (65 FE) MG tablet Take 1 tablet (325 mg total) by mouth 2 (two) times daily. 02/05/21  Yes Patrecia Pour, MD  oxyCODONE-acetaminophen (PERCOCET) 5-325 MG tablet Take 5 tablets by mouth every 4 (four) hours as needed for severe pain. 05/21/22  Yes Gerrit Heck, MD  busPIRone (BUSPAR) 5 MG tablet Take 1 tablet (5 mg total) by mouth 2 (two) times daily. Patient not taking: Reported on 05/21/2022 07/16/21   Kerin Perna, NP  vitamin B-12 (CYANOCOBALAMIN) 500 MCG tablet Take 1 tablet (500 mcg total) by mouth daily. Patient not taking: Reported on 05/21/2022 02/06/21   Patrecia Pour, MD      Allergies     Clindamycin/lincomycin    Review of Systems   Review of Systems  Constitutional:  Positive for activity change, appetite change and chills. Negative for fever.  HENT:  Negative for ear pain and sore throat.   Eyes:  Negative for pain and visual disturbance.  Respiratory:  Negative for cough and shortness of breath.   Cardiovascular:  Negative for chest pain, palpitations and leg swelling.  Gastrointestinal:  Positive for abdominal pain, constipation and nausea. Negative for abdominal distention, anal bleeding, blood in stool, diarrhea and vomiting.  Genitourinary:  Positive for difficulty urinating and pelvic pain. Negative for dysuria, flank pain, frequency, hematuria, urgency, vaginal bleeding and vaginal discharge.  Musculoskeletal:  Negative for arthralgias and back pain.  Skin:  Negative for color change and rash.  Neurological:  Negative for syncope.  All other systems reviewed and are negative.   Physical Exam Updated Vital Signs BP (!) 128/92   Pulse 64   Temp 98.5 F (36.9 C) (Oral)   Resp (!) 21   Ht 6' (1.829 m)   Wt 86.2 kg   LMP 05/21/2022   SpO2 100%   BMI 25.77 kg/m  Physical Exam Vitals and nursing note reviewed.  Constitutional:      General: She is not in acute distress.    Appearance: She is well-developed. She is ill-appearing. She is not toxic-appearing.  HENT:     Head: Normocephalic and atraumatic.     Mouth/Throat:     Mouth: Mucous membranes  are moist.  Eyes:     Conjunctiva/sclera: Conjunctivae normal.  Cardiovascular:     Rate and Rhythm: Normal rate and regular rhythm.     Heart sounds: No murmur heard. Pulmonary:     Effort: Pulmonary effort is normal. No respiratory distress.     Breath sounds: Normal breath sounds.  Abdominal:     General: Abdomen is flat. Bowel sounds are normal. There is no distension.     Palpations: Abdomen is soft.     Tenderness: There is abdominal tenderness in the suprapubic area. There is no right CVA  tenderness, left CVA tenderness, guarding or rebound. Negative signs include Rovsing's sign and McBurney's sign.  Musculoskeletal:        General: No swelling.     Cervical back: Neck supple.  Skin:    General: Skin is warm and dry.     Capillary Refill: Capillary refill takes less than 2 seconds.  Neurological:     General: No focal deficit present.     Mental Status: She is alert.  Psychiatric:        Mood and Affect: Mood normal.     ED Results / Procedures / Treatments   Labs (all labs ordered are listed, but only abnormal results are displayed) Labs Reviewed  CBC WITH DIFFERENTIAL/PLATELET - Abnormal; Notable for the following components:      Result Value   Hemoglobin 8.0 (*)    HCT 29.3 (*)    MCV 68.5 (*)    MCH 18.7 (*)    MCHC 27.3 (*)    RDW 21.3 (*)    All other components within normal limits  COMPREHENSIVE METABOLIC PANEL - Abnormal; Notable for the following components:   Sodium 133 (*)    Total Protein 9.2 (*)    Total Bilirubin 0.2 (*)    All other components within normal limits  URINALYSIS, ROUTINE W REFLEX MICROSCOPIC - Abnormal; Notable for the following components:   APPearance HAZY (*)    Hgb urine dipstick SMALL (*)    Leukocytes,Ua MODERATE (*)    Bacteria, UA FEW (*)    All other components within normal limits  I-STAT CHEM 8, ED - Abnormal; Notable for the following components:   Hemoglobin 9.9 (*)    HCT 29.0 (*)    All other components within normal limits  LIPASE, BLOOD  I-STAT BETA HCG BLOOD, ED ([facility], [facility], [facility] ONLY)    EKG None  Radiology CT Abdomen Pelvis W Contrast  Result Date: 05/21/2022 CLINICAL DATA:  Left lower quadrant abdominal pain. EXAM: CT ABDOMEN AND PELVIS WITH CONTRAST TECHNIQUE: Multidetector CT imaging of the abdomen and pelvis was performed using the standard protocol following bolus administration of intravenous contrast. RADIATION DOSE REDUCTION: This exam was performed according to the departmental dose-optimization  program which includes automated exposure control, adjustment of the mA and/or kV according to patient size and/or use of iterative reconstruction technique. CONTRAST:  39m OMNIPAQUE IOHEXOL 350 MG/ML SOLN COMPARISON:  Sep 16, 2021 FINDINGS: Lower chest: No acute abnormality. Hepatobiliary: A stable 3 mm focus of parenchymal low attenuation is seen within the anterolateral aspect of the right lobe of the liver. A 1.3 cm gallstone is seen within the lumen of a contracted gallbladder. There is no evidence of gallbladder wall thickening, pericholecystic inflammation or biliary dilatation. Pancreas: Unremarkable. No pancreatic ductal dilatation or surrounding inflammatory changes. Spleen: Normal in size without focal abnormality. Adrenals/Urinary Tract: Adrenal glands are unremarkable. Kidneys are normal in size, without renal calculi,  focal lesion, or hydronephrosis. The left kidney is mildly lobulated in appearance. The urinary bladder is poorly distended and subsequently limited in evaluation. Stomach/Bowel: Stomach is within normal limits. Appendix appears normal. No evidence of bowel wall thickening, distention, or inflammatory changes. Vascular/Lymphatic: Aortic atherosclerosis. No enlarged abdominal or pelvic lymph nodes. Reproductive: A 5.2 cm x 4.3 cm noncalcified soft tissue mass is seen within the anterior aspect body of the uterus. The bilateral adnexa are unremarkable. Other: A 2.1 cm x 1.3 cm fat containing umbilical hernia is noted. No abdominopelvic ascites. Musculoskeletal: No acute or significant osseous findings. IMPRESSION: 1. Cholelithiasis. 2. Findings consistent with a large uterine fibroid. 3. Aortic atherosclerosis. Aortic Atherosclerosis (ICD10-I70.0). Electronically Signed   By: Virgina Norfolk M.D.   On: 05/21/2022 21:52    Procedures Procedures   Medications Ordered in ED Medications  acetaminophen (TYLENOL) tablet 1,000 mg (1,000 mg Oral Not Given 05/21/22 2128)  ondansetron  (ZOFRAN-ODT) disintegrating tablet 4-8 mg (has no administration in time range)  ketorolac (TORADOL) 30 MG/ML injection 30 mg (30 mg Intravenous Given 05/21/22 2040)  morphine (PF) 4 MG/ML injection 4 mg (4 mg Intravenous Given 05/21/22 2157)  iohexol (OMNIPAQUE) 350 MG/ML injection 75 mL (75 mLs Intravenous Contrast Given 05/21/22 2143)    ED Course/ Medical Decision Making/ A&P                           Medical Decision Making 42 year old female PMH pelvic mass/cystic mass that required drainage last year presenting with lower abdominal pain since yesterday. Obtained CBC which showed no leukocytosis but did have anemia to hgb of 8. Patient says she was recently on her menstrual period and that her baseline is usually lower (although seems to have had a hgb of 11). No symptoms of GI bleeding. CT abdomen obtained which showed large uterine fibroid and cholelithiasis however no other acute issues. Lipase negative, UA does not appear infected.  Discussed with patient that this pain is most likely related to fibroid and that she should follow-up closely with OB/GYN.  Pain medication and return precautions discussed with patient.  Patient felt much better after medication administration here.  Risk OTC drugs. Prescription drug management.    Final Clinical Impression(s) / ED Diagnoses Final diagnoses:  Fibroid  Lower abdominal pain    Rx / DC Orders ED Discharge Orders          Ordered    oxyCODONE-acetaminophen (PERCOCET) 5-325 MG tablet  Every 4 hours PRN        05/21/22 2302              Gerrit Heck, MD 05/21/22 2306    Leanord Asal K, DO 05/22/22 1645

## 2022-05-21 NOTE — Discharge Instructions (Addendum)
Please follow up with OBGYN closely-it looks like you have a large fibroid an this is likely what is causing your pain. We did not see the cystic mass on your images. I have sent in Percocet to take as needed for severe pain Please take ibuprofen and Tylenol as needed for pain  Please take your home iron given your chronic anemia  Return if worsening abdominal pain, fever, worsening blood loss

## 2022-05-21 NOTE — ED Notes (Signed)
Lab calls back and reports they just received the lab work for the patient

## 2022-05-22 ENCOUNTER — Telehealth: Payer: Self-pay

## 2022-05-22 DIAGNOSIS — N939 Abnormal uterine and vaginal bleeding, unspecified: Secondary | ICD-10-CM

## 2022-05-22 MED ORDER — OXYCODONE-ACETAMINOPHEN 5-325 MG PO TABS: 1.0000 | ORAL_TABLET | Freq: Three times a day (TID) | ORAL | 0 refills | Status: DC | PRN

## 2022-05-22 NOTE — Telephone Encounter (Signed)
[facility] calls [facility] nurse line in regards to Oxycodone prescription sent in by [facility] .   [facility] reports the current sig states "take 5 tablets every 4 hours."   [facility] is requesting a new script with different directions.   Will forward to prescriber.

## 2022-05-22 NOTE — Telephone Encounter (Signed)
This RNCM communicated with EDP Jagadish who updated prescriptions to reflect oxycodone-acetaminophen (Percocet) 5-325 mg, take one tablet by mouth every 8 hours PRN for severe pain.  This RNCM called [facility] [phone], was on hold then another message came on after being on hold for 5 mins, on lunch break.  This RNCM spoke with patient to advise the EDP has updated the prescription. Patient agreed to follow up with [facility].  No additional TOC needs

## 2022-05-22 NOTE — Telephone Encounter (Signed)
This RNCM received inbound voicemail message from patient stating she was unable to get her medication due to was written incorrectly.  This RNCM  spoke with patient who reports her pain medication were written incorrectly. Per chart review oxycodone-acetaminophen/ Percocet 5-'325mg'$  was written take 5 tablets po every 8 hours PRN for severe pain. This RNCM advised patient will reach out to the EDP for correction however if EDP doesn't respond or re-write she will need to contact her PCP for follow. Patient verbalized understanding.   This RNCM reached out to [place], awaiting response.   TOC will continue to follow.

## 2022-07-19 ENCOUNTER — Ambulatory Visit
Admission: EM | Admit: 2022-07-19 | Discharge: 2022-07-19 | Disposition: A | Discharge status: Home / Self Care | Payer: 59 | Attending: Internal Medicine | Admitting: Internal Medicine

## 2022-07-19 ENCOUNTER — Encounter: Payer: Self-pay | Admitting: Emergency Medicine

## 2022-07-19 DIAGNOSIS — R350 Frequency of micturition: Secondary | ICD-10-CM

## 2022-07-19 DIAGNOSIS — Z113 Encounter for screening for infections with a predominantly sexual mode of transmission: Secondary | ICD-10-CM | POA: Diagnosis not present

## 2022-07-19 DIAGNOSIS — N3001 Acute cystitis with hematuria: Secondary | ICD-10-CM | POA: Diagnosis not present

## 2022-07-19 DIAGNOSIS — Z3202 Encounter for pregnancy test, result negative: Secondary | ICD-10-CM | POA: Diagnosis not present

## 2022-07-19 DIAGNOSIS — R300 Dysuria: Secondary | ICD-10-CM | POA: Diagnosis not present

## 2022-07-19 DIAGNOSIS — N898 Other specified noninflammatory disorders of vagina: Secondary | ICD-10-CM | POA: Diagnosis not present

## 2022-07-19 LAB — POCT URINALYSIS DIP (MANUAL ENTRY)
Bilirubin, UA: NEGATIVE
Glucose, UA: NEGATIVE mg/dL
Ketones, POC UA: NEGATIVE mg/dL
Nitrite, UA: NEGATIVE
Protein Ur, POC: >=300 mg/dL — AB
Spec Grav, UA: >=1.03 — AB (ref 1.010–1.025)
Urobilinogen, UA: 0.2 E.U./dL
pH, UA: 6.5 (ref 5.0–8.0)

## 2022-07-19 LAB — POCT URINE PREGNANCY: Preg Test, Ur: NEGATIVE

## 2022-07-19 MED ORDER — AMOXICILLIN-POT CLAVULANATE 875-125 MG PO TABS: 1.0000 | ORAL_TABLET | Freq: Two times a day (BID) | ORAL | 0 refills | Status: DC

## 2022-07-19 MED ORDER — CEFTRIAXONE SODIUM 1 G IJ SOLR
1.0000 g | Freq: Once | INTRAMUSCULAR | Status: AC
  Administered 2022-07-19: 1 g via INTRAMUSCULAR

## 2022-07-19 NOTE — ED Triage Notes (Signed)
Patient c/o dysuria, urgency, frequency, vaginal discharge that's yellowish/green, odor, nausea and body chills x 3 days.  Patient requesting STI check.

## 2022-07-19 NOTE — ED Provider Notes (Signed)
[name]    CSN: [id number] Arrival date & time: 07/19/22  [id number]      History   Chief Complaint Chief Complaint  Patient presents with   Dysuria    HPI Caitlin Maddox is a 42 y.o. female.   Patient presents with urinary burning, urinary urgency, urinary frequency, vaginal discharge that started about 3 days ago.  Reports vaginal discharge is yellow to green in color.  Reports that she has some bilateral lower back pain and some mild lower abdominal cramping as well.  Patient also reporting nausea without vomiting and chills.  Patient denies any documented fevers at home.  Patient requesting STD testing but denies any exposure to STD.  She has had unprotected sexual intercourse recently prior to symptoms starting.  Last menstrual cycle was 07/10/22.  Patient does have history of large uterine fibroid that was found on CT scan imaging in January and history of pelvic mass but reports that these symptoms feel completely different.  She has not yet followed up with gynecology for uterine fibroid.  She denies any abnormal vaginal bleeding.  She does not use any form of birth control.   Dysuria   Past Medical History:  Diagnosis Date   Cancer [facility])    Depression    Dysfunctional uterine bleeding    Pelvic mass in female    Retroperitoneal lymphadenopathy    Symptomatic anemia     Patient Active Problem List   Diagnosis Date Noted   Hypoalbuminemia due to protein-calorie malnutrition ([place])    Fever in adult    Adenopathy    Mass of uterine cervix 04/06/2021   Anemia due to blood loss, chronic 04/06/2021   Hyponatremia 04/06/2021   Splenomegaly 04/06/2021   Pelvic mass 04/06/2021   Cellulitis 02/02/2021   Cellulitis and abscess of buttock 02/01/2021   Abnormal uterine bleeding 02/01/2021   Symptomatic anemia 10/04/2020    Past Surgical History:  Procedure Laterality Date   CESAREAN SECTION  2005   CESAREAN SECTION  2006   TONSILLECTOMY     as a child     OB History     Gravida  3   Para  3   Term  3   Preterm      AB      Living         SAB      IAB      Ectopic      Multiple      Live Births  4        Obstetric Comments  SVD x 1. C-section x 2.           Home Medications    Prior to Admission medications   Medication Sig Start Date End Date Taking? Authorizing Provider  ferrous sulfate (FERROUSUL) 325 (65 FE) MG tablet Take 1 tablet (325 mg total) by mouth 2 (two) times daily. 02/05/21  Yes Patrecia Pour, MD  amoxicillin-clavulanate (AUGMENTIN) 875-125 MG tablet Take 1 tablet by mouth every 12 (twelve) hours. 07/19/22   Teodora Medici, FNP  busPIRone (BUSPAR) 5 MG tablet Take 1 tablet (5 mg total) by mouth 2 (two) times daily. Patient not taking: Reported on 05/21/2022 07/16/21   Kerin Perna, NP  ondansetron (ZOFRAN-ODT) 8 MG disintegrating tablet Take 1 tablet (8 mg total) by mouth every 8 (eight) hours as needed for nausea or vomiting. 05/21/22   Gerrit Heck, MD  oxyCODONE-acetaminophen (PERCOCET) 5-325 MG tablet Take 1 tablet by mouth every  8 (eight) hours as needed for severe pain. 05/22/22   Gerrit Heck, MD  vitamin B-12 (CYANOCOBALAMIN) 500 MCG tablet Take 1 tablet (500 mcg total) by mouth daily. Patient not taking: Reported on 05/21/2022 02/06/21   Patrecia Pour, MD    Family History Family History  Problem Relation Age of Onset   Hypertension Mother     Social History Social History   Tobacco Use   Smoking status: Former    Packs/day: 1.00    Years: 33.00    Total pack years: 33.00    Types: Cigarettes    Quit date: 10/09/2020    Years since quitting: 1.7   Smokeless tobacco: Never  Vaping Use   Vaping Use: Never used  Substance Use Topics   Alcohol use: No   Drug use: No     Allergies   Clindamycin/lincomycin   Review of Systems Review of Systems Per HPI  Physical Exam Triage Vital Signs ED Triage Vitals  Enc Vitals Group     BP 07/19/22 0828 128/77      Pulse Rate 07/19/22 0828 80     Resp 07/19/22 0828 18     Temp 07/19/22 0828 (!) 97.4 F (36.3 C)     Temp Source 07/19/22 0828 Oral     SpO2 07/19/22 0828 99 %     Weight 07/19/22 0830 215 lb (97.5 kg)     Height 07/19/22 0830 6' (1.829 m)     Head Circumference --      Peak Flow --      Pain Score 07/19/22 0830 8     Pain Loc --      Pain Edu? --      Excl. in [place]? --    No data found.  Updated Vital Signs BP 128/77 (BP Location: Left Arm)   Pulse 80   Temp (!) 97.4 F (36.3 C) (Oral)   Resp 18   Ht 6' (1.829 m)   Wt 215 lb (97.5 kg)   LMP 07/10/2022 (Exact Date)   SpO2 99%   BMI 29.16 kg/m   Visual Acuity Right Eye Distance:   Left Eye Distance:   Bilateral Distance:    Right Eye Near:   Left Eye Near:    Bilateral Near:     Physical Exam Constitutional:      General: She is not in acute distress.    Appearance: Normal appearance. She is not toxic-appearing or diaphoretic.  HENT:     Head: Normocephalic and atraumatic.  Eyes:     Extraocular Movements: Extraocular movements intact.     Conjunctiva/sclera: Conjunctivae normal.  Cardiovascular:     Rate and Rhythm: Normal rate and regular rhythm.     Pulses: Normal pulses.     Heart sounds: Normal heart sounds.  Pulmonary:     Effort: Pulmonary effort is normal. No respiratory distress.     Breath sounds: Normal breath sounds.  Abdominal:     General: Abdomen is flat. Bowel sounds are normal. There is no distension.     Palpations: Abdomen is soft.     Tenderness: There is abdominal tenderness. There is no right CVA tenderness or left CVA tenderness.     Comments: Very mild tenderness to palpation to lower abdomen  Genitourinary:    Comments: Deferred with shared decision making.  Self swab performed. Neurological:     General: No focal deficit present.     Mental Status: She is alert and oriented to person, place, and  time. Mental status is at baseline.  Psychiatric:        Mood and Affect: Mood  normal.        Behavior: Behavior normal.        Thought Content: Thought content normal.        Judgment: Judgment normal.      UC Treatments / Results  Labs (all labs ordered are listed, but only abnormal results are displayed) Labs Reviewed  POCT URINALYSIS DIP (MANUAL ENTRY) - Abnormal; Notable for the following components:      Result Value   Clarity, UA cloudy (*)    Spec Grav, UA >=1.030 (*)    Blood, UA trace-intact (*)    Protein Ur, POC >=300 (*)    Leukocytes, UA Large (3+) (*)    All other components within normal limits  URINE CULTURE  CBC  COMPREHENSIVE METABOLIC PANEL  POCT URINE PREGNANCY  CERVICOVAGINAL ANCILLARY ONLY    EKG   Radiology No results found.  Procedures Procedures (including critical care time)  Medications Ordered in UC Medications  cefTRIAXone (ROCEPHIN) injection 1 g (1 g Intramuscular Given 07/19/22 0918)    Initial Impression / Assessment and Plan / UC Course  I have reviewed the triage vital signs and the nursing notes.  Pertinent labs & imaging results that were available during my care of the patient were reviewed by me and considered in my medical decision making (see chart for details).     UA showing a large amount of leukocytes which is indicative of urinary tract infection with associated symptoms.  Although, patient is also having vaginal discharge which is also concerning for vaginitis.  Will send urine culture and cervicovaginal swab to determine exact etiology.  In the meantime given associated urinary symptoms will opt to treat with antibiotics today.  IM Rocephin administered which will cover for both gonorrhea and pyelonephritis/UTI.  Augmentin also prescribed for patient to cover for UTI/pyelonephritis.  Awaiting result for urine culture and cervicovaginal swab for any further treatment.  Urine pregnancy test negative.  Will obtain CMP and CBC as well to rule out any other worrisome etiologies.  Advised patient to  refrain from sexual activity until test results and treatment are complete.  Vital signs are normal so there is no concern for systemic complications at this time so do not think that emergent evaluation is necessary.  Discussed return and ER precautions.  Patient verbalized understanding and was agreeable with plan. Final Clinical Impressions(s) / UC Diagnoses   Final diagnoses:  Vaginal discharge  Dysuria  Urinary frequency  Screening examination for venereal disease  Acute cystitis with hematuria  Urine pregnancy test negative     Discharge Instructions      I have given you an antibiotic injection and prescribed antibiotic for you to take.  Take this with food to avoid stomach upset.  Vaginal swab and urine culture pending.  Will call if they are abnormal.  Please follow up if any symptoms persist or worsen.    ED Prescriptions     Medication Sig Dispense Auth. Provider   amoxicillin-clavulanate (AUGMENTIN) 875-125 MG tablet  (Status: Discontinued) Take 1 tablet by mouth every 12 (twelve) hours. 14 tablet [place], Rison E, [place]   amoxicillin-clavulanate (AUGMENTIN) 875-125 MG tablet Take 1 tablet by mouth every 12 (twelve) hours. 14 tablet [place], Michele Rockers, [place]      PDMP not reviewed this encounter.   Teodora Medici, [place] 07/19/22 [phone]

## 2022-07-19 NOTE — Discharge Instructions (Signed)
I have given you an antibiotic injection and prescribed antibiotic for you to take.  Take this with food to avoid stomach upset.  Vaginal swab and urine culture pending.  Will call if they are abnormal.  Please follow up if any symptoms persist or worsen.

## 2022-07-20 LAB — CERVICOVAGINAL ANCILLARY ONLY
Bacterial Vaginitis (gardnerella): POSITIVE — AB
Candida Glabrata: NEGATIVE
Candida Vaginitis: NEGATIVE
Chlamydia: NEGATIVE
Comment: NEGATIVE
Comment: NEGATIVE
Comment: NEGATIVE
Comment: NEGATIVE
Comment: NEGATIVE
Comment: NORMAL
Neisseria Gonorrhea: NEGATIVE
Trichomonas: POSITIVE — AB

## 2022-07-21 ENCOUNTER — Telehealth: Payer: Self-pay | Admitting: Family Medicine

## 2022-07-21 LAB — CBC
Hematocrit: 19.8 % — ABNORMAL LOW (ref 34.0–46.6)
Hemoglobin: 5 g/dL — CL (ref 11.1–15.9)
MCH: 17.6 pg — ABNORMAL LOW (ref 26.6–33.0)
MCHC: 25.3 g/dL — ABNORMAL LOW (ref 31.5–35.7)
MCV: 70 fL — ABNORMAL LOW (ref 79–97)
Platelets: 443 10*3/uL (ref 150–450)
RBC: 2.84 x10E6/uL — ABNORMAL LOW (ref 3.77–5.28)
RDW: 19.7 % — ABNORMAL HIGH (ref 11.7–15.4)
WBC: 4.9 10*3/uL (ref 3.4–10.8)

## 2022-07-21 LAB — COMPREHENSIVE METABOLIC PANEL
ALT: 10 IU/L (ref 0–32)
AST: 14 IU/L (ref 0–40)
Albumin/Globulin Ratio: 1.1 — ABNORMAL LOW (ref 1.2–2.2)
Albumin: 4 g/dL (ref 3.9–4.9)
Alkaline Phosphatase: 52 IU/L (ref 44–121)
BUN/Creatinine Ratio: 5 — ABNORMAL LOW (ref 9–23)
BUN: 4 mg/dL — ABNORMAL LOW (ref 6–24)
Bilirubin Total: 0.2 mg/dL (ref 0.0–1.2)
CO2: 24 mmol/L (ref 20–29)
Calcium: 9.3 mg/dL (ref 8.7–10.2)
Chloride: 102 mmol/L (ref 96–106)
Creatinine, Ser: 0.87 mg/dL (ref 0.57–1.00)
Globulin, Total: 3.7 g/dL (ref 1.5–4.5)
Glucose: 96 mg/dL (ref 70–99)
Potassium: 3.8 mmol/L (ref 3.5–5.2)
Sodium: 140 mmol/L (ref 134–144)
Total Protein: 7.7 g/dL (ref 6.0–8.5)
eGFR: 86 mL/min/{1.73_m2} (ref >59)

## 2022-07-21 LAB — URINE CULTURE: Culture: 50000 — AB

## 2022-07-21 NOTE — Telephone Encounter (Signed)
Patient's hemoglobin is dangerously low at 5, and she needs to present to the emergency room for repeat lab work.  She is already been treated for UTI at the time of the visit, but she does have a positive swab for trichomoniasis and for Candida.  That should be treated with fluconazole and metronidazole

## 2022-07-21 NOTE — Telephone Encounter (Signed)
Attempted to contact patient. No answer, no v/m. Please continue trying to reach patient to provide MD's plan.

## 2022-07-22 ENCOUNTER — Inpatient Hospital Stay (HOSPITAL_COMMUNITY)
Admission: EM | Admit: 2022-07-22 | Discharge: 2022-07-25 | DRG: 690 | Disposition: A | Discharge status: Home / Self Care | Payer: 59 | Attending: Internal Medicine | Admitting: Internal Medicine

## 2022-07-22 ENCOUNTER — Telehealth (HOSPITAL_COMMUNITY): Payer: Self-pay | Admitting: Emergency Medicine

## 2022-07-22 ENCOUNTER — Emergency Department (HOSPITAL_COMMUNITY): Payer: 59

## 2022-07-22 ENCOUNTER — Encounter (HOSPITAL_COMMUNITY): Payer: Self-pay

## 2022-07-22 ENCOUNTER — Other Ambulatory Visit: Payer: Self-pay

## 2022-07-22 DIAGNOSIS — B9620 Unspecified Escherichia coli [E. coli] as the cause of diseases classified elsewhere: Secondary | ICD-10-CM | POA: Diagnosis present

## 2022-07-22 DIAGNOSIS — N3090 Cystitis, unspecified without hematuria: Secondary | ICD-10-CM | POA: Diagnosis not present

## 2022-07-22 DIAGNOSIS — Z881 Allergy status to other antibiotic agents status: Secondary | ICD-10-CM

## 2022-07-22 DIAGNOSIS — N12 Tubulo-interstitial nephritis, not specified as acute or chronic: Secondary | ICD-10-CM | POA: Diagnosis present

## 2022-07-22 DIAGNOSIS — Z87891 Personal history of nicotine dependence: Secondary | ICD-10-CM

## 2022-07-22 DIAGNOSIS — N760 Acute vaginitis: Secondary | ICD-10-CM | POA: Diagnosis present

## 2022-07-22 DIAGNOSIS — N920 Excessive and frequent menstruation with regular cycle: Secondary | ICD-10-CM | POA: Diagnosis present

## 2022-07-22 DIAGNOSIS — B9689 Other specified bacterial agents as the cause of diseases classified elsewhere: Secondary | ICD-10-CM

## 2022-07-22 DIAGNOSIS — D649 Anemia, unspecified: Secondary | ICD-10-CM

## 2022-07-22 DIAGNOSIS — Z1612 Extended spectrum beta lactamase (ESBL) resistance: Secondary | ICD-10-CM | POA: Diagnosis present

## 2022-07-22 DIAGNOSIS — D500 Iron deficiency anemia secondary to blood loss (chronic): Secondary | ICD-10-CM | POA: Diagnosis present

## 2022-07-22 DIAGNOSIS — N390 Urinary tract infection, site not specified: Secondary | ICD-10-CM | POA: Diagnosis not present

## 2022-07-22 DIAGNOSIS — B9629 Other Escherichia coli [E. coli] as the cause of diseases classified elsewhere: Secondary | ICD-10-CM

## 2022-07-22 DIAGNOSIS — D259 Leiomyoma of uterus, unspecified: Secondary | ICD-10-CM | POA: Diagnosis present

## 2022-07-22 DIAGNOSIS — A599 Trichomoniasis, unspecified: Secondary | ICD-10-CM | POA: Diagnosis present

## 2022-07-22 LAB — CBC
HCT: 21 % — ABNORMAL LOW (ref 36.0–46.0)
Hemoglobin: 5.6 g/dL — CL (ref 12.0–15.0)
MCH: 18.7 pg — ABNORMAL LOW (ref 26.0–34.0)
MCHC: 26.7 g/dL — ABNORMAL LOW (ref 30.0–36.0)
MCV: 70 fL — ABNORMAL LOW (ref 80.0–100.0)
Platelets: 311 10*3/uL (ref 150–400)
RBC: 3 MIL/uL — ABNORMAL LOW (ref 3.87–5.11)
RDW: 20.9 % — ABNORMAL HIGH (ref 11.5–15.5)
WBC: 6.1 10*3/uL (ref 4.0–10.5)
nRBC: 0 % (ref 0.0–0.2)

## 2022-07-22 LAB — COMPREHENSIVE METABOLIC PANEL
ALT: 12 U/L (ref 0–44)
AST: 17 U/L (ref 15–41)
Albumin: 3.9 g/dL (ref 3.5–5.0)
Alkaline Phosphatase: 49 U/L (ref 38–126)
Anion gap: 8 (ref 5–15)
BUN: 5 mg/dL — ABNORMAL LOW (ref 6–20)
CO2: 23 mmol/L (ref 22–32)
Calcium: 9 mg/dL (ref 8.9–10.3)
Chloride: 106 mmol/L (ref 98–111)
Creatinine, Ser: 0.81 mg/dL (ref 0.44–1.00)
GFR, Estimated: >60 mL/min (ref >60)
Glucose, Bld: 83 mg/dL (ref 70–99)
Potassium: 3.8 mmol/L (ref 3.5–5.1)
Sodium: 137 mmol/L (ref 135–145)
Total Bilirubin: <0.1 mg/dL — ABNORMAL LOW (ref 0.3–1.2)
Total Protein: 8.6 g/dL — ABNORMAL HIGH (ref 6.5–8.1)

## 2022-07-22 LAB — I-STAT BETA HCG BLOOD, ED (MC, WL, AP ONLY): I-stat hCG, quantitative: <5 m[IU]/mL (ref <5)

## 2022-07-22 LAB — PREPARE RBC (CROSSMATCH)

## 2022-07-22 MED ORDER — MORPHINE SULFATE (PF) 4 MG/ML IV SOLN
4.0000 mg | Freq: Once | INTRAVENOUS | Status: AC
  Administered 2022-07-22: 4 mg via INTRAVENOUS
  Filled 2022-07-22: qty 1

## 2022-07-22 MED ORDER — SODIUM CHLORIDE 0.9 % IV SOLN
1.0000 g | Freq: Three times a day (TID) | INTRAVENOUS | Status: DC
  Administered 2022-07-23 – 2022-07-25 (×8): 1 g via INTRAVENOUS
  Filled 2022-07-22 (×10): qty 20

## 2022-07-22 MED ORDER — SODIUM CHLORIDE 0.9% IV SOLUTION: Freq: Once | INTRAVENOUS | Status: AC

## 2022-07-22 MED ORDER — METRONIDAZOLE 500 MG PO TABS
500.0000 mg | ORAL_TABLET | Freq: Two times a day (BID) | ORAL | Status: DC
  Administered 2022-07-23 – 2022-07-25 (×6): 500 mg via ORAL
  Filled 2022-07-22 (×6): qty 1

## 2022-07-22 MED ORDER — SULFAMETHOXAZOLE-TRIMETHOPRIM 800-160 MG PO TABS
1.0000 | ORAL_TABLET | Freq: Once | ORAL | Status: DC
  Filled 2022-07-22: qty 1

## 2022-07-22 NOTE — ED Triage Notes (Addendum)
Pt c/o lower abdominal pain, low back pain, and dysuria x3 days.  Pain score 8/10.  Pt reports she was seen at [facility] and diagnosed w/ a UTI.  Pt was given a "shot of Rocephin" but has not picked up the prescribed medication.  Pt received a call today that her Hgb was low.  Denies fatigue, bleeding, or dark stools.  Additionally urine is yellow. Hx of anemia w/ noncompliance w/ iron supplements.

## 2022-07-22 NOTE — ED Provider Notes (Signed)
[facility] Provider Note   CSN: [id number] Arrival date & time: 07/22/22  1131     History  Chief Complaint  Patient presents with   Abdominal Pain   Dysuria    Caitlin Maddox is a 42 y.o. female.  HPI   42 year old female with past medical history of iron deficiency anemia presents emergency department with concern for anemia and ongoing UTI.  Patient is supposed to be taking iron supplements, she states she has not taken this in the past couple weeks.  She does admit to heavy menstrual periods but denies any active bleeding at this time.  Patient was noted to have a urinary tract infection about 4 days ago.  She received an IM dose of Rocephin but never picked up her oral antibiotics.  She presents with ongoing suprapubic discomfort/burning as well as dysuria and frequency.  Patient feels generally fatigued but denies any specific complaints including chest pain, shortness of breath.  Home Medications Prior to Admission medications   Medication Sig Start Date End Date Taking? Authorizing Provider  amoxicillin-clavulanate (AUGMENTIN) 875-125 MG tablet Take 1 tablet by mouth every 12 (twelve) hours. Patient not taking: Reported on 07/22/2022 07/19/22   Teodora Medici, FNP  busPIRone (BUSPAR) 5 MG tablet Take 1 tablet (5 mg total) by mouth 2 (two) times daily. Patient not taking: Reported on 05/21/2022 07/16/21   Kerin Perna, NP  ferrous sulfate (FERROUSUL) 325 (65 FE) MG tablet Take 1 tablet (325 mg total) by mouth 2 (two) times daily. Patient not taking: Reported on 07/22/2022 02/05/21   Patrecia Pour, MD  ondansetron (ZOFRAN-ODT) 8 MG disintegrating tablet Take 1 tablet (8 mg total) by mouth every 8 (eight) hours as needed for nausea or vomiting. Patient not taking: Reported on 07/22/2022 05/21/22   Gerrit Heck, MD  oxyCODONE-acetaminophen (PERCOCET) 5-325 MG tablet Take 1 tablet by mouth every 8 (eight) hours as needed for severe  pain. Patient not taking: Reported on 07/22/2022 05/22/22   Gerrit Heck, MD  vitamin B-12 (CYANOCOBALAMIN) 500 MCG tablet Take 1 tablet (500 mcg total) by mouth daily. Patient not taking: Reported on 05/21/2022 02/06/21   Patrecia Pour, MD      Allergies    Clindamycin/lincomycin    Review of Systems   Review of Systems  Constitutional:  Positive for chills. Negative for fever.  Respiratory:  Negative for shortness of breath.   Cardiovascular:  Negative for chest pain.  Gastrointestinal:  Positive for nausea. Negative for abdominal pain, diarrhea and vomiting.  Genitourinary:  Positive for dysuria, frequency and pelvic pain.  Skin:  Negative for rash.  Neurological:  Negative for headaches.    Physical Exam Updated Vital Signs BP 131/71 (BP Location: Right Arm)   Pulse 75   Temp 98.2 F (36.8 C) (Oral)   Resp 18   Ht 6' (1.829 m)   Wt 97.5 kg   LMP 07/10/2022 (Exact Date)   SpO2 100%   BMI 29.16 kg/m  Physical Exam Vitals and nursing note reviewed.  Constitutional:      General: She is not in acute distress.    Appearance: Normal appearance.  HENT:     Head: Normocephalic.     Mouth/Throat:     Mouth: Mucous membranes are moist.  Cardiovascular:     Rate and Rhythm: Normal rate.  Pulmonary:     Effort: Pulmonary effort is normal. No respiratory distress.  Abdominal:     General: Bowel sounds  are normal.     Palpations: Abdomen is soft.     Tenderness: There is abdominal tenderness in the suprapubic area.  Skin:    General: Skin is warm.  Neurological:     Mental Status: She is alert and oriented to person, place, and time. Mental status is at baseline.  Psychiatric:        Mood and Affect: Mood normal.     ED Results / Procedures / Treatments   Labs (all labs ordered are listed, but only abnormal results are displayed) Labs Reviewed  COMPREHENSIVE METABOLIC PANEL - Abnormal; Notable for the following components:      Result Value   BUN 5 (*)    Total  Protein 8.6 (*)    Total Bilirubin <0.1 (*)    All other components within normal limits  CBC - Abnormal; Notable for the following components:   RBC 3.00 (*)    Hemoglobin 5.6 (*)    HCT 21.0 (*)    MCV 70.0 (*)    MCH 18.7 (*)    MCHC 26.7 (*)    RDW 20.9 (*)    All other components within normal limits  I-STAT BETA HCG BLOOD, ED ([facility], [facility], [facility] ONLY)  POC OCCULT BLOOD, ED  TYPE AND SCREEN  PREPARE RBC (CROSSMATCH)    EKG None  Radiology US Pelvis Complete  Result Date: 07/22/2022 CLINICAL DATA:  Lower abdominal pain EXAM: TRANSABDOMINAL ULTRASOUND OF PELVIS DOPPLER ULTRASOUND OF OVARIES TECHNIQUE: Transabdominal ultrasound examination of the pelvis was performed including evaluation of the uterus, ovaries, adnexal regions, and pelvic cul-de-sac. Color and duplex Doppler ultrasound was utilized to evaluate blood flow to the ovaries. COMPARISON:  05/21/2022 FINDINGS: Uterus Measurements: 13.6 x 4.1 x 7.9 cm = volume: 118.4 mL. There are 2 fibroid seen within the anterior aspect of the uterine body. An exophytic 3.9 x 3.9 x 5.1 cm fibroid is seen superiorly. 3.7 x 3.5 x 3.2 cm intramural fibroid is seen within the lower uterine segment. Endometrium Thickness: 11.8 mm.  No focal abnormality visualized. Right ovary Measurements: 3.2 x 2.9 x 2.0 cm = volume: 10.0 mL. Normal appearance/no adnexal mass. Left ovary Measurements: 2 3.1 x 1.8 x 1.8 cm = volume: 5.4 mL. Normal appearance/no adnexal mass. Pulsed Doppler evaluation demonstrates normal low-resistance arterial and venous waveforms in both ovaries. Other: No free fluid. IMPRESSION: 1. Uterine fibroids as above. 2. Otherwise unremarkable pelvic ultrasound. Electronically Signed   By: Randa Ngo M.D.   On: 07/22/2022 21:31   [place] Art/Ven Flow Abd Pelv Doppler  Result Date: 07/22/2022 CLINICAL DATA:  Lower abdominal pain EXAM: TRANSABDOMINAL ULTRASOUND OF PELVIS DOPPLER ULTRASOUND OF OVARIES TECHNIQUE: Transabdominal ultrasound examination  of the pelvis was performed including evaluation of the uterus, ovaries, adnexal regions, and pelvic cul-de-sac. Color and duplex Doppler ultrasound was utilized to evaluate blood flow to the ovaries. COMPARISON:  05/21/2022 FINDINGS: Uterus Measurements: 13.6 x 4.1 x 7.9 cm = volume: 118.4 mL. There are 2 fibroid seen within the anterior aspect of the uterine body. An exophytic 3.9 x 3.9 x 5.1 cm fibroid is seen superiorly. 3.7 x 3.5 x 3.2 cm intramural fibroid is seen within the lower uterine segment. Endometrium Thickness: 11.8 mm.  No focal abnormality visualized. Right ovary Measurements: 3.2 x 2.9 x 2.0 cm = volume: 10.0 mL. Normal appearance/no adnexal mass. Left ovary Measurements: 2 3.1 x 1.8 x 1.8 cm = volume: 5.4 mL. Normal appearance/no adnexal mass. Pulsed Doppler evaluation demonstrates normal low-resistance arterial and venous  waveforms in both ovaries. Other: No free fluid. IMPRESSION: 1. Uterine fibroids as above. 2. Otherwise unremarkable pelvic ultrasound. Electronically Signed   By: Randa Ngo M.D.   On: 07/22/2022 21:31    Procedures .Critical Care  Performed by: Lorelle Gibbs, DO Authorized by: Lorelle Gibbs, DO   Critical care provider statement:    Critical care time (minutes):  30   Critical care was necessary to treat or prevent imminent or life-threatening deterioration of the following conditions:  Metabolic crisis   Critical care was time spent personally by me on the following activities:  Development of treatment plan with patient or surrogate, discussions with consultants, evaluation of patient's response to treatment, examination of patient, ordering and review of laboratory studies, ordering and review of radiographic studies, ordering and performing treatments and interventions, pulse oximetry, re-evaluation of patient's condition and review of old charts   I assumed direction of critical care for this patient from another provider in my specialty: no      Care discussed with: admitting provider       Medications Ordered in ED Medications  0.9 %  sodium chloride infusion (Manually program via Guardrails IV Fluids) (has no administration in time range)    ED Course/ Medical Decision Making/ A&P                             Medical Decision Making Amount and/or Complexity of Data Reviewed Labs: ordered.  Risk Prescription drug management.   42 year old female presents emergency department concern for anemia and ongoing UTI.  Patient denies any active bleeding or black stools.  She has an ongoing UTI that she received an IM dose of Rocephin for couple days ago but is not currently taking her oral antibiotics.  Endorses chills and overall weakness.  Vitals are stable on arrival.  No significant leukocytosis but there is an anemia of 5.4.  Baseline appears to be around 9.  She has required transfusions in the past.  Will plan for transfusion here.    In regards to the UTI the urine culture is now available.  It is resistant to multiple medications.  Reviewed with pharmacy and at this time feel p.o. Bactrim is appropriate.  Kidney function is normal.  Patient has ongoing suprapubic discomfort, for this reason a pelvic ultrasound was done which identifies fibroids but no other acute findings.  Patient will be admitted for transfusion and treatment of UTI.  Patients evaluation and results requires admission for further treatment and care.  Spoke with hospitalist, reviewed patient's ED course and they accept admission.  Patient agrees with admission plan, offers no new complaints and is stable/unchanged at time of admit.        Final Clinical Impression(s) / ED Diagnoses Final diagnoses:  None    Rx / DC Orders ED Discharge Orders     None         Lorelle Gibbs, DO 07/22/22 2242

## 2022-07-22 NOTE — ED Provider Triage Note (Signed)
Emergency Medicine Provider Triage Evaluation Note  Caitlin Maddox , a 42 y.o. female  was evaluated in triage.  Pt was called by UC today in order to be seen in the ED. Patient has an underlying hx of anemia. Critical hemoglobin of 5.0 three days ago when she was evaluated there and diagnosed for a UTI. Prior hx of transfusion, just finished her menstrual cycle. No other source of bleeding.   Review of Systems  Positive:  Negative: Melena, hematemesis,   Physical Exam  BP 113/76   Pulse 77   Temp 98.6 F (37 C) (Oral)   Resp 16   LMP 07/10/2022 (Exact Date)   SpO2 100%  Gen:   Awake, no distress   Resp:  Normal effort  MSK:   Moves extremities without difficulty  Other:  Conjunctiva is very pale  Medical Decision Making  Medically screening exam initiated at 12:34 PM.  Appropriate orders placed.  Caitlin Maddox was informed that the remainder of the evaluation will be completed by another provider, this initial triage assessment does not replace that evaluation, and the importance of remaining in the ED until their evaluation is complete.  IDA    Janeece Fitting, PA-C 07/22/22 1237

## 2022-07-22 NOTE — H&P (Signed)
History and Physical    Caitlin Maddox [id number] DOB: [birth year] DOA: 07/22/2022  PCP: Kerin Perna, NP  Patient coming from: Home  I have personally briefly reviewed patient's old medical records in [place]  Chief Complaint: Anemia, dysuria  HPI: Caitlin Maddox is a 42 y.o. female with medical history significant for fibroid uterus with dysfunctional uterine bleeding, anemia due to chronic blood loss, GAD who presented to the ED for evaluation of symptomatic anemia.  Patient was recently seen in urgent care on 07/19/2022 with symptoms of dysuria with urinary frequency and vaginal discharge beginning 3 days prior.  Cervicovaginal swab was positive for trichomonas and bacterial vaginitis.  Urine culture has also resulted positive for ESBL E. coli.  She was also found to have recurrent anemia with hemoglobin 5.0.  She was unable to be contacted yesterday but was reached today and advised to come to the ED for further evaluation and management.  Patient denies any other obvious bleeding including hematemesis, hematochezia, or melena.  She does report chronic history of heavy menses.  She has had fatigue but denies lightheadedness or dizziness.  She does not take any blood thinners or any prescription medications regularly.  She says she does not have a gynecologist.  ED Course  Labs/Imaging on admission: I have personally reviewed following labs and imaging studies.  Initial vitals showed BP 113/76, pulse 77, RR 16, temp 98.6 F, SpO2 100% on room air.  Labs showed hemoglobin 5.6, platelets 311,000, WBC 6.1, sodium 137, potassium 3.8, bicarb 23, BUN 5, creatinine 0.81, serum glucose 83, i-STAT beta-hCG <5.0.  Pelvic ultrasound showed uterine fibroids and otherwise unremarkable study.  Patient was ordered to receive 2 unit PRBC transfusion.  The hospitalist service was consulted to admit for further evaluation and management.  Review of Systems: All systems reviewed and are  negative except as documented in history of present illness above.   Past Medical History:  Diagnosis Date   Cancer [facility])    Depression    Dysfunctional uterine bleeding    Pelvic mass in female    Retroperitoneal lymphadenopathy    Symptomatic anemia     Past Surgical History:  Procedure Laterality Date   CESAREAN SECTION  2005   CESAREAN SECTION  2006   TONSILLECTOMY     as a child    Social History:  reports that she quit smoking about 21 months ago. Her smoking use included cigarettes. She has a 33.00 pack-year smoking history. She has never used smokeless tobacco. She reports that she does not drink alcohol and does not use drugs.  Allergies  Allergen Reactions   Clindamycin/Lincomycin Anaphylaxis and Swelling    Family History  Problem Relation Age of Onset   Hypertension Mother      Prior to Admission medications   Medication Sig Start Date End Date Taking? Authorizing Provider  amoxicillin-clavulanate (AUGMENTIN) 875-125 MG tablet Take 1 tablet by mouth every 12 (twelve) hours. Patient not taking: Reported on 07/22/2022 07/19/22   Teodora Medici, FNP  busPIRone (BUSPAR) 5 MG tablet Take 1 tablet (5 mg total) by mouth 2 (two) times daily. Patient not taking: Reported on 05/21/2022 07/16/21   Kerin Perna, NP  ferrous sulfate (FERROUSUL) 325 (65 FE) MG tablet Take 1 tablet (325 mg total) by mouth 2 (two) times daily. Patient not taking: Reported on 07/22/2022 02/05/21   Patrecia Pour, MD  ondansetron (ZOFRAN-ODT) 8 MG disintegrating tablet Take 1 tablet (8 mg total) by mouth every 8 (  eight) hours as needed for nausea or vomiting. Patient not taking: Reported on 07/22/2022 05/21/22   Gerrit Heck, MD  oxyCODONE-acetaminophen (PERCOCET) 5-325 MG tablet Take 1 tablet by mouth every 8 (eight) hours as needed for severe pain. Patient not taking: Reported on 07/22/2022 05/22/22   Gerrit Heck, MD  vitamin B-12 (CYANOCOBALAMIN) 500 MCG tablet Take 1 tablet (500 mcg  total) by mouth daily. Patient not taking: Reported on 05/21/2022 02/06/21   Patrecia Pour, MD    Physical Exam: Vitals:   07/22/22 1242 07/22/22 1632 07/22/22 2208 07/22/22 2344  BP:  115/80 131/71 104/61  Pulse:  75 75 72  Resp:  '16 18 14  '$ Temp:  98.5 F (36.9 C) 98.2 F (36.8 C) 97.8 F (36.6 C)  TempSrc:  Oral Oral   SpO2:  100% 100% 100%  Weight: 97.5 kg     Height: 6' (1.829 m)      Constitutional: Resting in bed, NAD, calm, comfortable Eyes: EOMI, lids and conjunctivae normal ENMT: Mucous membranes are moist. Posterior pharynx clear of any exudate or lesions.Normal dentition.  Neck: normal, supple, no masses. Respiratory: clear to auscultation bilaterally, no wheezing, no crackles. Normal respiratory effort. No accessory muscle use.  Cardiovascular: Regular rate and rhythm, no murmurs / rubs / gallops. No extremity edema. 2+ pedal pulses. Abdomen: Suprapubic tenderness Musculoskeletal: no clubbing / cyanosis. No joint deformity upper and lower extremities. Good ROM, no contractures. Normal muscle tone.  Skin: no rashes, lesions, ulcers. No induration Neurologic: Sensation intact. Strength 5/5 in all 4.  Psychiatric: Normal judgment and insight. Alert and oriented x 3. Normal mood.   EKG: Not performed.  Assessment/Plan Principal Problem:   Symptomatic anemia Active Problems:   Urinary tract infection due to extended-spectrum beta lactamase (ESBL) producing Escherichia coli   Anemia due to blood loss, chronic   Fibroid uterus   Bacterial vaginitis   Trichomoniasis   Caitlin Maddox is a 42 y.o. female with medical history significant for fibroid uterus with dysfunctional uterine bleeding, anemia due to chronic blood loss, GAD who is admitted with symptomatic anemia due to chronic blood loss in setting of uterine fibroids.  Assessment and Plan: Symptomatic anemia due to chronic blood loss in setting of fibroid uterus: Hemoglobin 5.6 on admission compared to previous 9.9  on 05/21/2022. -Transfusing 2 unit PRBC -Repeat CBC posttransfusion -Check ferritin, iron/TIBC  ESBL E. coli UTI: Seen on urine culture obtained 3/11.  Start on IV meropenem.  Trichomoniasis/bacterial vaginitis: Started on oral Flagyl.   DVT prophylaxis: SCDs Start: 07/23/22 0013 Code Status: Full code, confirmed with patient on admission Family Communication: Discussed with patient, she has discussed with family Disposition Plan: From home and likely discharge to home pending clinical progress Consults called: None Severity of Illness: The appropriate patient status for this patient is OBSERVATION. Observation status is judged to be reasonable and necessary in order to provide the required intensity of service to ensure the patient's safety. The patient's presenting symptoms, physical exam findings, and initial radiographic and laboratory data in the context of their medical condition is felt to place them at decreased risk for further clinical deterioration. Furthermore, it is anticipated that the patient will be medically stable for discharge from the hospital within 2 midnights of admission.   Zada Finders MD [facility]  If 7PM-7AM, please contact night-coverage [email or web address]  07/23/2022, 12:11 AM

## 2022-07-22 NOTE — Telephone Encounter (Signed)
Patient is being sent post-discharge from the Urgent Care and sent to the Emergency Department via private vehicle . Per Dr. Windy Carina, patient is in need of higher level of care due to critically low hemoglobin. Patient is aware and verbalizes understanding of plan of care.  Patient also has additional results (urine culture and cytology) that will need to be addressed at her visit, but I was instructed by Dr. Lanny Cramp not to treat patient at this time, and to have her proceed to the ED for evaluation and treatment.

## 2022-07-23 DIAGNOSIS — A599 Trichomoniasis, unspecified: Secondary | ICD-10-CM | POA: Diagnosis not present

## 2022-07-23 DIAGNOSIS — Z881 Allergy status to other antibiotic agents status: Secondary | ICD-10-CM | POA: Diagnosis not present

## 2022-07-23 DIAGNOSIS — N390 Urinary tract infection, site not specified: Secondary | ICD-10-CM | POA: Diagnosis not present

## 2022-07-23 DIAGNOSIS — N760 Acute vaginitis: Secondary | ICD-10-CM | POA: Diagnosis not present

## 2022-07-23 DIAGNOSIS — D500 Iron deficiency anemia secondary to blood loss (chronic): Secondary | ICD-10-CM | POA: Diagnosis not present

## 2022-07-23 DIAGNOSIS — D259 Leiomyoma of uterus, unspecified: Secondary | ICD-10-CM | POA: Diagnosis not present

## 2022-07-23 DIAGNOSIS — N12 Tubulo-interstitial nephritis, not specified as acute or chronic: Secondary | ICD-10-CM | POA: Diagnosis not present

## 2022-07-23 DIAGNOSIS — D649 Anemia, unspecified: Secondary | ICD-10-CM | POA: Diagnosis not present

## 2022-07-23 DIAGNOSIS — N3090 Cystitis, unspecified without hematuria: Secondary | ICD-10-CM | POA: Diagnosis not present

## 2022-07-23 DIAGNOSIS — B9620 Unspecified Escherichia coli [E. coli] as the cause of diseases classified elsewhere: Secondary | ICD-10-CM | POA: Diagnosis not present

## 2022-07-23 DIAGNOSIS — B9629 Other Escherichia coli [E. coli] as the cause of diseases classified elsewhere: Secondary | ICD-10-CM

## 2022-07-23 DIAGNOSIS — Z1612 Extended spectrum beta lactamase (ESBL) resistance: Secondary | ICD-10-CM | POA: Diagnosis not present

## 2022-07-23 DIAGNOSIS — Z87891 Personal history of nicotine dependence: Secondary | ICD-10-CM | POA: Diagnosis not present

## 2022-07-23 DIAGNOSIS — N920 Excessive and frequent menstruation with regular cycle: Secondary | ICD-10-CM | POA: Diagnosis not present

## 2022-07-23 LAB — IRON AND TIBC
Iron: 25 ug/dL — ABNORMAL LOW (ref 28–170)
Saturation Ratios: 7 % — ABNORMAL LOW (ref 10.4–31.8)
TIBC: 386 ug/dL (ref 250–450)
UIBC: 361 ug/dL

## 2022-07-23 LAB — CBC
HCT: 22.6 % — ABNORMAL LOW (ref 36.0–46.0)
Hemoglobin: 6.6 g/dL — CL (ref 12.0–15.0)
MCH: 20.7 pg — ABNORMAL LOW (ref 26.0–34.0)
MCHC: 29.2 g/dL — ABNORMAL LOW (ref 30.0–36.0)
MCV: 70.8 fL — ABNORMAL LOW (ref 80.0–100.0)
Platelets: 235 10*3/uL (ref 150–400)
RBC: 3.19 MIL/uL — ABNORMAL LOW (ref 3.87–5.11)
RDW: 20.4 % — ABNORMAL HIGH (ref 11.5–15.5)
WBC: 5.5 10*3/uL (ref 4.0–10.5)
nRBC: 0 % (ref 0.0–0.2)

## 2022-07-23 LAB — FERRITIN: Ferritin: 2 ng/mL — ABNORMAL LOW (ref 11–307)

## 2022-07-23 LAB — MRSA NEXT GEN BY PCR, NASAL: MRSA by PCR Next Gen: NOT DETECTED

## 2022-07-23 LAB — HIV ANTIBODY (ROUTINE TESTING W REFLEX): HIV Screen 4th Generation wRfx: NONREACTIVE

## 2022-07-23 MED ORDER — ONDANSETRON HCL 4 MG PO TABS: 4.0000 mg | ORAL_TABLET | Freq: Four times a day (QID) | ORAL | Status: DC | PRN

## 2022-07-23 MED ORDER — SODIUM CHLORIDE 0.9 % IV SOLN
250.0000 mg | Freq: Every day | INTRAVENOUS | Status: DC
  Administered 2022-07-23 – 2022-07-25 (×3): 250 mg via INTRAVENOUS
  Filled 2022-07-23 (×3): qty 20

## 2022-07-23 MED ORDER — SODIUM CHLORIDE 0.9 % IV SOLN
250.0000 mg | Freq: Once | INTRAVENOUS | Status: DC
  Filled 2022-07-23: qty 20

## 2022-07-23 MED ORDER — ACETAMINOPHEN 325 MG PO TABS
650.0000 mg | ORAL_TABLET | Freq: Four times a day (QID) | ORAL | Status: DC | PRN
  Administered 2022-07-23: 650 mg via ORAL
  Filled 2022-07-23: qty 2

## 2022-07-23 MED ORDER — OXYCODONE HCL 5 MG PO TABS
5.0000 mg | ORAL_TABLET | Freq: Four times a day (QID) | ORAL | Status: DC | PRN
  Administered 2022-07-23 – 2022-07-25 (×5): 5 mg via ORAL
  Filled 2022-07-23 (×5): qty 1

## 2022-07-23 MED ORDER — SODIUM CHLORIDE 0.9 % IV SOLN
125.0000 mg | Freq: Once | INTRAVENOUS | Status: DC
  Filled 2022-07-23: qty 10

## 2022-07-23 MED ORDER — ONDANSETRON HCL 4 MG/2ML IJ SOLN: 4.0000 mg | Freq: Four times a day (QID) | INTRAMUSCULAR | Status: DC | PRN

## 2022-07-23 MED ORDER — SENNOSIDES-DOCUSATE SODIUM 8.6-50 MG PO TABS: 1.0000 | ORAL_TABLET | Freq: Every evening | ORAL | Status: DC | PRN

## 2022-07-23 MED ORDER — ACETAMINOPHEN 650 MG RE SUPP: 650.0000 mg | Freq: Four times a day (QID) | RECTAL | Status: DC | PRN

## 2022-07-23 NOTE — ED Notes (Signed)
ED TO INPATIENT HANDOFF REPORT  ED Nurse Name and Phone #: Antionette Poles RN [phone]  S Name/Age/Gender Caitlin Maddox 42 y.o. female Room/Bed: 022C/022C  Code Status   Code Status: Full Code  Home/SNF/Other Home Patient oriented to: self, place, time, and situation Is this baseline? Yes   Triage Complete: Triage complete  Chief Complaint Symptomatic anemia [D64.9]  Triage Note Pt c/o lower abdominal pain, low back pain, and dysuria x3 days.  Pain score 8/10.  Pt reports she was seen at [facility] and diagnosed w/ a UTI.  Pt was given a "shot of Rocephin" but has not picked up the prescribed medication.  Pt received a call today that her Hgb was low.  Denies fatigue, bleeding, or dark stools.  Additionally urine is yellow. Hx of anemia w/ noncompliance w/ iron supplements.    Allergies Allergies  Allergen Reactions   Clindamycin/Lincomycin Anaphylaxis and Swelling    Level of Care/Admitting Diagnosis ED Disposition     ED Disposition  Admit   Condition  --   Comment  Hospital Area: [facility] [[id number]]  Level of Care: Med-Surg [16]  May place patient in observation at [facility] or [place] if equivalent level of care is available:: No  Covid Evaluation: Asymptomatic - no recent exposure (last 10 days) testing not required  Diagnosis: Symptomatic anemia [id number]  Admitting Physician: Lenore Cordia [id number]  Attending Physician: Lenore Cordia [id number]          B Medical/Surgery History Past Medical History:  Diagnosis Date   Cancer ([place])    Depression    Dysfunctional uterine bleeding    Pelvic mass in female    Retroperitoneal lymphadenopathy    Symptomatic anemia    Past Surgical History:  Procedure Laterality Date   CESAREAN SECTION  2005   CESAREAN SECTION  2006   TONSILLECTOMY     as a child     A IV Location/Drains/Wounds Patient Lines/Drains/Airways Status     Active Line/Drains/Airways     Name Placement date  Placement time Site Days   Peripheral IV 07/22/22 20 G Right Antecubital 07/22/22  2343  Antecubital  1   Peripheral IV 07/22/22 20 G Anterior;Right Forearm 07/22/22  2343  Forearm  1   PICC Single Lumen 04/10/21 Right Brachial 39 cm 0 cm 04/10/21  2110  Brachial  469            Intake/Output Last 24 hours No intake or output data in the 24 hours ending 07/23/22 0047  Labs/Imaging Results for orders placed or performed during the hospital encounter of 07/22/22 (from the past 48 hour(s))  Comprehensive metabolic panel     Status: Abnormal   Collection Time: 07/22/22 12:44 PM  Result Value Ref Range   Sodium 137 135 - 145 mmol/L   Potassium 3.8 3.5 - 5.1 mmol/L   Chloride 106 98 - 111 mmol/L   CO2 23 22 - 32 mmol/L   Glucose, Bld 83 70 - 99 mg/dL    Comment: Glucose reference range applies only to samples taken after fasting for at least 8 hours.   BUN 5 (L) 6 - 20 mg/dL   Creatinine, Ser 0.81 0.44 - 1.00 mg/dL   Calcium 9.0 8.9 - 10.3 mg/dL   Total Protein 8.6 (H) 6.5 - 8.1 g/dL   Albumin 3.9 3.5 - 5.0 g/dL   AST 17 15 - 41 U/L   ALT 12 0 - 44 U/L   Alkaline Phosphatase 49  38 - 126 U/L   Total Bilirubin <0.1 (L) 0.3 - 1.2 mg/dL   GFR, Estimated >60 >60 mL/min    Comment: (NOTE) Calculated using the CKD-EPI Creatinine Equation (2021)    Anion gap 8 5 - 15    Comment: Performed at [facility] [address], [place], [place] [address]  CBC     Status: Abnormal   Collection Time: 07/22/22 12:44 PM  Result Value Ref Range   WBC 6.1 4.0 - 10.5 K/uL   RBC 3.00 (L) 3.87 - 5.11 MIL/uL   Hemoglobin 5.6 (LL) 12.0 - 15.0 g/dL    Comment: REPEATED TO VERIFY Reticulocyte Hemoglobin testing may be clinically indicated, consider ordering this additional test [id number] THIS CRITICAL RESULT HAS VERIFIED AND BEEN CALLED TO A.Camak ON 03 13 2024 AT [id number], AND HAS BEEN READ BACK.     HCT 21.0 (L) 36.0 - 46.0 %   MCV 70.0 (L) 80.0 - 100.0 fL   MCH 18.7 (L) 26.0  - 34.0 pg   MCHC 26.7 (L) 30.0 - 36.0 g/dL   RDW 20.9 (H) 11.5 - 15.5 %   Platelets 311 150 - 400 K/uL    Comment: REPEATED TO VERIFY   nRBC 0.0 0.0 - 0.2 %    Comment: Performed at [facility] [address] [place], [place] [address]  Type and screen [place]     Status: None (Preliminary result)   Collection Time: 07/22/22  1:12 PM  Result Value Ref Range   ABO/RH(D) A POS    Antibody Screen NEG    Sample Expiration 07/25/2022,2359    Unit Number [id number]    Blood Component Type RED CELLS,LR    Unit division 00    Status of Unit ALLOCATED    Transfusion Status OK TO TRANSFUSE    Crossmatch Result Compatible    Unit Number [id number]    Blood Component Type RED CELLS,LR    Unit division 00    Status of Unit ISSUED    Transfusion Status OK TO TRANSFUSE    Crossmatch Result      Compatible Performed at [facility], [place] [address], [place], [place] [address]   I-Stat beta hCG blood, ED     Status: None   Collection Time: 07/22/22  1:39 PM  Result Value Ref Range   I-stat hCG, quantitative <5.0 <5 mIU/mL   Comment 3            Comment:   GEST. AGE      CONC.  (mIU/mL)   <=1 WEEK        5 - 50     2 WEEKS       50 - 500     3 WEEKS       100 - 10,000     4 WEEKS     1,000 - 30,000        FEMALE AND NON-PREGNANT FEMALE:     LESS THAN 5 mIU/mL   Prepare RBC (crossmatch)     Status: None   Collection Time: 07/22/22 10:22 PM  Result Value Ref Range   Order Confirmation      ORDER PROCESSED BY BLOOD BANK Performed at [facility], [place] [address] [place], [place] [address]    US Pelvis Complete  Result Date: 07/22/2022 CLINICAL DATA:  Lower abdominal pain EXAM: TRANSABDOMINAL ULTRASOUND OF PELVIS DOPPLER ULTRASOUND OF OVARIES TECHNIQUE: Transabdominal ultrasound examination  of the pelvis was performed including evaluation of the uterus, ovaries, adnexal regions, and pelvic cul-de-sac. Color and duplex Doppler ultrasound was  utilized to evaluate blood flow to the ovaries. COMPARISON:  05/21/2022 FINDINGS: Uterus Measurements: 13.6 x 4.1 x 7.9 cm = volume: 118.4 mL. There are 2 fibroid seen within the anterior aspect of the uterine body. An exophytic 3.9 x 3.9 x 5.1 cm fibroid is seen superiorly. 3.7 x 3.5 x 3.2 cm intramural fibroid is seen within the lower uterine segment. Endometrium Thickness: 11.8 mm.  No focal abnormality visualized. Right ovary Measurements: 3.2 x 2.9 x 2.0 cm = volume: 10.0 mL. Normal appearance/no adnexal mass. Left ovary Measurements: 2 3.1 x 1.8 x 1.8 cm = volume: 5.4 mL. Normal appearance/no adnexal mass. Pulsed Doppler evaluation demonstrates normal low-resistance arterial and venous waveforms in both ovaries. Other: No free fluid. IMPRESSION: 1. Uterine fibroids as above. 2. Otherwise unremarkable pelvic ultrasound. Electronically Signed   By: Randa Ngo M.D.   On: 07/22/2022 21:31   [place] Art/Ven Flow Abd Pelv Doppler  Result Date: 07/22/2022 CLINICAL DATA:  Lower abdominal pain EXAM: TRANSABDOMINAL ULTRASOUND OF PELVIS DOPPLER ULTRASOUND OF OVARIES TECHNIQUE: Transabdominal ultrasound examination of the pelvis was performed including evaluation of the uterus, ovaries, adnexal regions, and pelvic cul-de-sac. Color and duplex Doppler ultrasound was utilized to evaluate blood flow to the ovaries. COMPARISON:  05/21/2022 FINDINGS: Uterus Measurements: 13.6 x 4.1 x 7.9 cm = volume: 118.4 mL. There are 2 fibroid seen within the anterior aspect of the uterine body. An exophytic 3.9 x 3.9 x 5.1 cm fibroid is seen superiorly. 3.7 x 3.5 x 3.2 cm intramural fibroid is seen within the lower uterine segment. Endometrium Thickness: 11.8 mm.  No focal abnormality visualized. Right ovary Measurements: 3.2 x 2.9 x 2.0 cm = volume: 10.0 mL. Normal appearance/no adnexal mass. Left ovary Measurements: 2 3.1 x 1.8 x 1.8 cm = volume: 5.4 mL. Normal appearance/no adnexal mass. Pulsed Doppler evaluation demonstrates normal  low-resistance arterial and venous waveforms in both ovaries. Other: No free fluid. IMPRESSION: 1. Uterine fibroids as above. 2. Otherwise unremarkable pelvic ultrasound. Electronically Signed   By: Randa Ngo M.D.   On: 07/22/2022 21:31    Pending Labs Unresulted Labs (From admission, onward)     Start     Ordered   07/23/22 0500  HIV Antibody (routine testing w rflx)  (HIV Antibody (Routine testing w reflex) panel)  Tomorrow morning,   R        07/23/22 0015   07/23/22 0500  CBC  Tomorrow morning,   R        07/23/22 0015   07/23/22 0015  Iron and TIBC  (Anemia Panel (PNL))  Once,   R        07/23/22 0015   07/23/22 0015  Ferritin  (Anemia Panel (PNL))  Once,   R        07/23/22 0015            Vitals/Pain Today's Vitals   07/22/22 2208 07/22/22 2344 07/23/22 0015 07/23/22 0017  BP: 131/71 104/61 114/76   Pulse: 75 72 74   Resp: '18 14 16   '$ Temp: 98.2 F (36.8 C) 97.8 F (36.6 C) 97.6 F (36.4 C)   TempSrc: Oral     SpO2: 100% 100% 100%   Weight:      Height:      PainSc:    5     Isolation Precautions No active isolations  Medications  Medications  0.9 %  sodium chloride infusion (Manually program via Guardrails IV Fluids) (has no administration in time range)  meropenem (MERREM) 1 g in sodium chloride 0.9 % 100 mL IVPB (has no administration in time range)  metroNIDAZOLE (FLAGYL) tablet 500 mg (500 mg Oral Given 07/23/22 0026)  acetaminophen (TYLENOL) tablet 650 mg (has no administration in time range)    Or  acetaminophen (TYLENOL) suppository 650 mg (has no administration in time range)  ondansetron (ZOFRAN) tablet 4 mg (has no administration in time range)    Or  ondansetron (ZOFRAN) injection 4 mg (has no administration in time range)  senna-docusate (Senokot-S) tablet 1 tablet (has no administration in time range)  morphine (PF) 4 MG/ML injection 4 mg (4 mg Intravenous Given 07/22/22 2352)    Mobility walks     Focused Assessments Cardiac  Assessment Handoff:  Cardiac Rhythm: Normal sinus rhythm No results found for: "CKTOTAL", "CKMB", "CKMBINDEX", "TROPONINI" No results found for: "DDIMER" Does the Patient currently have chest pain? No    R Recommendations: See Admitting Provider Note  Report given to:   Additional Notes:

## 2022-07-23 NOTE — Progress Notes (Signed)
MEDICATION RELATED CONSULT NOTE - INITIAL   Pharmacy Consult for iv IRON Indication: ANEMIA 2/2 fibroid uterus-menorrhagia  Allergies  Allergen Reactions   Clindamycin/Lincomycin Anaphylaxis and Swelling    Patient Measurements: Height: 6' (182.9 cm) Weight: 97.5 kg (215 lb) IBW/kg (Calculated) : 73.1   Vital Signs: Temp: 98 F (36.7 C) (03/14 0515) Temp Source: Oral (03/14 0515) BP: 122/88 (03/14 0810) Pulse Rate: 72 (03/14 0810) Intake/Output from previous day: 03/13 0701 - 03/14 0700 In: 628.7 [Blood:628.7] Out: -  Intake/Output from this shift: No intake/output data recorded.  Labs: Recent Labs    07/22/22 1244 07/23/22 0723  WBC 6.1 5.5  HGB 5.6* 6.6*  HCT 21.0* 22.6*  PLT 311 235  CREATININE 0.81  --   ALBUMIN 3.9  --   PROT 8.6*  --   AST 17  --   ALT 12  --   ALKPHOS 49  --   BILITOT <0.1*  --    Estimated Creatinine Clearance: 119.6 mL/min (by C-G formula based on SCr of 0.81 mg/dL).   Microbiology: Recent Results (from the past 720 hour(s))  Urine Culture     Status: Abnormal   Collection Time: 07/19/22  8:58 AM   Specimen: Urine, Clean Catch  Result Value Ref Range Status   Specimen Description URINE, CLEAN CATCH  Final   Special Requests   Final    NONE Performed at [facility], [address], [place], [place] [address]    Culture (A)  Final    50,000 COLONIES/mL ESCHERICHIA COLI Confirmed Extended Spectrum Beta-Lactamase Producer (ESBL).  In bloodstream infections from ESBL organisms, carbapenems are preferred over piperacillin/tazobactam. They are shown to have a lower risk of mortality.    Report Status 07/21/2022 FINAL  Final   Organism ID, Bacteria ESCHERICHIA COLI (A)  Final      Susceptibility   Escherichia coli - MIC*    AMPICILLIN >=32 RESISTANT Resistant     CEFAZOLIN >=64 RESISTANT Resistant     CEFEPIME >=32 RESISTANT Resistant     CEFTRIAXONE >=64 RESISTANT Resistant     CIPROFLOXACIN >=4 RESISTANT Resistant      GENTAMICIN <=1 SENSITIVE Sensitive     IMIPENEM <=0.25 SENSITIVE Sensitive     NITROFURANTOIN 64 INTERMEDIATE Intermediate     TRIMETH/SULFA <=20 SENSITIVE Sensitive     AMPICILLIN/SULBACTAM >=32 RESISTANT Resistant     PIP/TAZO 64 INTERMEDIATE Intermediate     * 50,000 COLONIES/mL ESCHERICHIA COLI    Medical History: Past Medical History:  Diagnosis Date   Cancer ([place])    Depression    Dysfunctional uterine bleeding    Pelvic mass in female    Retroperitoneal lymphadenopathy    Symptomatic anemia       Assessment: 89 YOF with an admitting Hgb 5 grams/dL and total iron stores of 25 mcg/dL (ferritin 2 w/ normal TIBC)  Iron replacement calculations:  BW: 97.5 kg Hgb 6.6 g/dL (measured) Iron content per mL Ferrlecit: 12.5 mg/dL Blood volume (assumed): ~65 ml/kg x 97.5 kg / 100 mL/dL = ~63.375 dL Hgb correction: (normal) 14 g/dL- 6.6 g/dL = ~7.4 g/dL deficit Assumed 3.3 mg iron per gram Hgb 7.4 g/dL (deficit)*63.375 dL (assumed volume of blood) = 469 Hgb/dL deficit  Iron deficit: Hgb 469 g/dL (x) 3.3 mg Fe / g Hgb = ~1548 mg Fe Iron deficit= 1548 mg / 12.5 mg elemental iron / mL=  123 mL  Goal of Therapy:  Return iron stores and Hgb within normal limits (iron >28 /  Hgb >12)  Plan:  Ferrlecit 250 mg iv daily x4 CBC daily x4 Follow-up iron study 07/28/2022  Vaughan Basta BS, PharmD, BCPS Clinical Pharmacist 07/23/2022 11:28 AM  Contact: [phone] after 3 PM  "Be curious, not judgmental..." -Jamal Maes

## 2022-07-23 NOTE — Hospital Course (Signed)
Caitlin Maddox is a 42 y.o. female with medical history significant for fibroid uterus with dysfunctional uterine bleeding, anemia due to chronic blood loss, GAD who is admitted with symptomatic anemia due to chronic blood loss in setting of uterine fibroids.

## 2022-07-23 NOTE — Progress Notes (Signed)
PROGRESS NOTE        PATIENT DETAILS Name: Caitlin Maddox Age: 42 y.o. Sex: female Date of Birth: 05/22/1980 Admit Date: 07/22/2022 Admitting Physician Lenore Cordia, MD [id number], [place] Cage, NP  Brief Summary: Patient is a 42 y.o.  female with history of fibroid uterus-menorrhagia-who presented to a local urgent care on 3/10 with fever of 101 F, dysuria-she was given a dose of Rocephin-urine/vaginal swab was taken and she was sent home.  She was subsequently found to have a hemoglobin of 5, urine culture was positive for ESBL E. coli-she was subsequently referred to the ED for admission.  Significant events: 3/13>> admit to [facility]  Significant studies: 3/13>> abdominal ultrasound/Doppler ultrasound of ovaries: Fibroid uterus  Significant microbiology data: None  Procedures: None  Consults: None  Subjective: Complains of bilateral flank pain/pelvic pain-dysuria slowly improving.  Afebrile overnight.  Acknowledges longstanding history of menorrhagia (menstrual period lasting-7-10 days)  Objective: Vitals: Blood pressure 122/88, pulse 72, temperature 98 F (36.7 C), temperature source Oral, resp. rate 18, height 6' (1.829 m), weight 97.5 kg, last menstrual period 07/10/2022, SpO2 100 %.   Exam: Gen Exam:Alert awake-not in any distress HEENT:atraumatic, normocephalic Chest: B/L clear to auscultation anteriorly CVS:S1S2 regular Abdomen:soft non tender, non distended Extremities:no edema Neurology: Non focal Skin: no rash  Pertinent Labs/Radiology:    Latest Ref Rng & Units 07/23/2022    7:23 AM 07/22/2022   12:44 PM 07/19/2022    9:17 AM  CBC  WBC 4.0 - 10.5 K/uL 5.5  6.1  4.9   Hemoglobin 12.0 - 15.0 g/dL 6.6  5.6  5.0   Hematocrit 36.0 - 46.0 % 22.6  21.0  19.8   Platelets 150 - 400 K/uL 235  311  443     Lab Results  Component Value Date   NA 137 07/22/2022   K 3.8 07/22/2022   CL 106 07/22/2022   CO2 23 07/22/2022       Assessment/Plan: ESBL E. coli cystitis-possible early pyelonephritis Febrile to 7 F couple of days ago, continues to have dysuria-some mild bilateral flank tenderness and pelvic tenderness-but is otherwise stable Continue IV meropenem  Severe microcytic anemia due to chronic blood loss due to menorrhagia from fibroid uterus Hemoglobin improved to 6.6 after 2 units of PRBC transfusion Will ask pharmacy to dose IV iron Will need outpatient GYN evaluation  Trichomoniasis/bacterial vaginitis Flagyl  BMI: Estimated body mass index is 29.16 kg/m as calculated from the following:   Height as of this encounter: 6' (1.829 m).   Weight as of this encounter: 97.5 kg.   Code status:   Code Status: Full Code   DVT Prophylaxis: SCDs Start: 07/23/22 0013   Family Communication: None at bedside  Disposition Plan: Status is: Observation The patient will require care spanning > 2 midnights and should be moved to inpatient because: Severity of illness-needs inpatient status-as she has early pyelonephritis from ESBL E. coli UTI.   Planned Discharge Destination:Home   Diet: Diet Order             Diet regular Room service appropriate? Yes; Fluid consistency: Thin  Diet effective now                     Antimicrobial agents: Anti-infectives (From admission, onward)    Start     Dose/Rate Route Frequency Ordered Stop  07/23/22 0000  meropenem (MERREM) 1 g in sodium chloride 0.9 % 100 mL IVPB        1 g 200 mL/hr over 30 Minutes Intravenous Every 8 hours 07/22/22 2350     07/23/22 0000  metroNIDAZOLE (FLAGYL) tablet 500 mg        500 mg Oral Every 12 hours 07/22/22 2350 07/29/22 2159   07/22/22 2245  sulfamethoxazole-trimethoprim (BACTRIM DS) 800-160 MG per tablet 1 tablet  Status:  Discontinued        1 tablet Oral  Once 07/22/22 2230 07/22/22 2350        MEDICATIONS: Scheduled Meds:  metroNIDAZOLE  500 mg Oral Q12H   Continuous Infusions:  meropenem (MERREM)  IV 1 g (07/23/22 0519)   PRN Meds:.acetaminophen **OR** acetaminophen, ondansetron **OR** ondansetron (ZOFRAN) IV, senna-docusate   I have personally reviewed following labs and imaging studies  LABORATORY DATA: CBC: Recent Labs  Lab 07/19/22 0917 07/22/22 1244 07/23/22 0723  WBC 4.9 6.1 5.5  HGB 5.0* 5.6* 6.6*  HCT 19.8* 21.0* 22.6*  MCV 70* 70.0* 70.8*  PLT 443 311 [id number]    Basic Metabolic Panel: Recent Labs  Lab 07/19/22 0917 07/22/22 1244  NA 140 137  K 3.8 3.8  CL 102 106  CO2 24 23  GLUCOSE 96 83  BUN 4* 5*  CREATININE 0.87 0.81  CALCIUM 9.3 9.0    GFR: Estimated Creatinine Clearance: 119.6 mL/min (by C-G formula based on SCr of 0.81 mg/dL).  Liver Function Tests: Recent Labs  Lab 07/19/22 0917 07/22/22 1244  AST 14 17  ALT 10 12  ALKPHOS 52 49  BILITOT 0.2 <0.1*  PROT 7.7 8.6*  ALBUMIN 4.0 3.9   No results for input(s): "LIPASE", "AMYLASE" in the last 168 hours. No results for input(s): "AMMONIA" in the last 168 hours.  Coagulation Profile: No results for input(s): "INR", "PROTIME" in the last 168 hours.  Cardiac Enzymes: No results for input(s): "CKTOTAL", "CKMB", "CKMBINDEX", "TROPONINI" in the last 168 hours.  BNP (last 3 results) No results for input(s): "PROBNP" in the last 8760 hours.  Lipid Profile: No results for input(s): "CHOL", "HDL", "LDLCALC", "TRIG", "CHOLHDL", "LDLDIRECT" in the last 72 hours.  Thyroid Function Tests: No results for input(s): "TSH", "T4TOTAL", "FREET4", "T3FREE", "THYROIDAB" in the last 72 hours.  Anemia Panel: Recent Labs    07/23/22 0723  FERRITIN 2*  TIBC 386  IRON 25*    Urine analysis:    Component Value Date/Time   COLORURINE YELLOW 05/21/2022 2006   APPEARANCEUR HAZY (A) 05/21/2022 2006   LABSPEC 1.009 05/21/2022 2006   PHURINE 7.0 05/21/2022 2006   GLUCOSEU NEGATIVE 05/21/2022 2006   HGBUR SMALL (A) 05/21/2022 2006   BILIRUBINUR negative 07/19/2022 0840   KETONESUR negative 07/19/2022  0840   KETONESUR NEGATIVE 05/21/2022 2006   PROTEINUR >=300 (A) 07/19/2022 0840   PROTEINUR NEGATIVE 05/21/2022 2006   UROBILINOGEN 0.2 07/19/2022 0840   NITRITE Negative 07/19/2022 0840   NITRITE NEGATIVE 05/21/2022 2006   LEUKOCYTESUR Large (3+) (A) 07/19/2022 0840   LEUKOCYTESUR MODERATE (A) 05/21/2022 2006    Sepsis Labs: Lactic Acid, Venous    Component Value Date/Time   LATICACIDVEN 1.2 04/08/2021 1105    MICROBIOLOGY: Recent Results (from the past 240 hour(s))  Urine Culture     Status: Abnormal   Collection Time: 07/19/22  8:58 AM   Specimen: Urine, Clean Catch  Result Value Ref Range Status   Specimen Description URINE, CLEAN CATCH  Final   Special Requests  Final    NONE Performed at [facility], [place] [address] [place], [place] [address]    Culture (A)  Final    50,000 COLONIES/mL ESCHERICHIA COLI Confirmed Extended Spectrum Beta-Lactamase Producer (ESBL).  In bloodstream infections from ESBL organisms, carbapenems are preferred over piperacillin/tazobactam. They are shown to have a lower risk of mortality.    Report Status 07/21/2022 FINAL  Final   Organism ID, Bacteria ESCHERICHIA COLI (A)  Final      Susceptibility   Escherichia coli - MIC*    AMPICILLIN >=32 RESISTANT Resistant     CEFAZOLIN >=64 RESISTANT Resistant     CEFEPIME >=32 RESISTANT Resistant     CEFTRIAXONE >=64 RESISTANT Resistant     CIPROFLOXACIN >=4 RESISTANT Resistant     GENTAMICIN <=1 SENSITIVE Sensitive     IMIPENEM <=0.25 SENSITIVE Sensitive     NITROFURANTOIN 64 INTERMEDIATE Intermediate     TRIMETH/SULFA <=20 SENSITIVE Sensitive     AMPICILLIN/SULBACTAM >=32 RESISTANT Resistant     PIP/TAZO 64 INTERMEDIATE Intermediate     * 50,000 COLONIES/mL ESCHERICHIA COLI    RADIOLOGY STUDIES/RESULTS: US Pelvis Complete  Result Date: 07/22/2022 CLINICAL DATA:  Lower abdominal pain EXAM: TRANSABDOMINAL ULTRASOUND OF PELVIS DOPPLER ULTRASOUND OF OVARIES TECHNIQUE: Transabdominal  ultrasound examination of the pelvis was performed including evaluation of the uterus, ovaries, adnexal regions, and pelvic cul-de-sac. Color and duplex Doppler ultrasound was utilized to evaluate blood flow to the ovaries. COMPARISON:  05/21/2022 FINDINGS: Uterus Measurements: 13.6 x 4.1 x 7.9 cm = volume: 118.4 mL. There are 2 fibroid seen within the anterior aspect of the uterine body. An exophytic 3.9 x 3.9 x 5.1 cm fibroid is seen superiorly. 3.7 x 3.5 x 3.2 cm intramural fibroid is seen within the lower uterine segment. Endometrium Thickness: 11.8 mm.  No focal abnormality visualized. Right ovary Measurements: 3.2 x 2.9 x 2.0 cm = volume: 10.0 mL. Normal appearance/no adnexal mass. Left ovary Measurements: 2 3.1 x 1.8 x 1.8 cm = volume: 5.4 mL. Normal appearance/no adnexal mass. Pulsed Doppler evaluation demonstrates normal low-resistance arterial and venous waveforms in both ovaries. Other: No free fluid. IMPRESSION: 1. Uterine fibroids as above. 2. Otherwise unremarkable pelvic ultrasound. Electronically Signed   By: Randa Ngo M.D.   On: 07/22/2022 21:31   [place] Art/Ven Flow Abd Pelv Doppler  Result Date: 07/22/2022 CLINICAL DATA:  Lower abdominal pain EXAM: TRANSABDOMINAL ULTRASOUND OF PELVIS DOPPLER ULTRASOUND OF OVARIES TECHNIQUE: Transabdominal ultrasound examination of the pelvis was performed including evaluation of the uterus, ovaries, adnexal regions, and pelvic cul-de-sac. Color and duplex Doppler ultrasound was utilized to evaluate blood flow to the ovaries. COMPARISON:  05/21/2022 FINDINGS: Uterus Measurements: 13.6 x 4.1 x 7.9 cm = volume: 118.4 mL. There are 2 fibroid seen within the anterior aspect of the uterine body. An exophytic 3.9 x 3.9 x 5.1 cm fibroid is seen superiorly. 3.7 x 3.5 x 3.2 cm intramural fibroid is seen within the lower uterine segment. Endometrium Thickness: 11.8 mm.  No focal abnormality visualized. Right ovary Measurements: 3.2 x 2.9 x 2.0 cm = volume: 10.0 mL.  Normal appearance/no adnexal mass. Left ovary Measurements: 2 3.1 x 1.8 x 1.8 cm = volume: 5.4 mL. Normal appearance/no adnexal mass. Pulsed Doppler evaluation demonstrates normal low-resistance arterial and venous waveforms in both ovaries. Other: No free fluid. IMPRESSION: 1. Uterine fibroids as above. 2. Otherwise unremarkable pelvic ultrasound. Electronically Signed   By: Randa Ngo M.D.   On: 07/22/2022 21:31     LOS:  0 days   Oren Binet, MD  [facility]    To contact the attending provider between 7A-7P or the covering provider during after hours 7P-7A, please log into the web site [email or web address] and access using universal [place] password for that web site. If you do not have the password, please call the hospital operator.  07/23/2022, 11:04 AM

## 2022-07-24 DIAGNOSIS — N390 Urinary tract infection, site not specified: Secondary | ICD-10-CM | POA: Diagnosis not present

## 2022-07-24 DIAGNOSIS — A599 Trichomoniasis, unspecified: Secondary | ICD-10-CM | POA: Diagnosis not present

## 2022-07-24 DIAGNOSIS — D500 Iron deficiency anemia secondary to blood loss (chronic): Secondary | ICD-10-CM | POA: Diagnosis not present

## 2022-07-24 DIAGNOSIS — D649 Anemia, unspecified: Secondary | ICD-10-CM | POA: Diagnosis not present

## 2022-07-24 LAB — TYPE AND SCREEN
ABO/RH(D): A POS
Antibody Screen: NEGATIVE
Unit division: 0
Unit division: 0

## 2022-07-24 LAB — BPAM RBC
Blood Product Expiration Date: 202404062359
Blood Product Expiration Date: 202404062359
ISSUE DATE / TIME: 202403132351
ISSUE DATE / TIME: 202403140151
Unit Type and Rh: 6200
Unit Type and Rh: 6200

## 2022-07-24 LAB — CBC
HCT: 23.3 % — ABNORMAL LOW (ref 36.0–46.0)
Hemoglobin: 6.7 g/dL — CL (ref 12.0–15.0)
MCH: 20.7 pg — ABNORMAL LOW (ref 26.0–34.0)
MCHC: 28.8 g/dL — ABNORMAL LOW (ref 30.0–36.0)
MCV: 71.9 fL — ABNORMAL LOW (ref 80.0–100.0)
Platelets: 123 10*3/uL — ABNORMAL LOW (ref 150–400)
RBC: 3.24 MIL/uL — ABNORMAL LOW (ref 3.87–5.11)
RDW: 21.2 % — ABNORMAL HIGH (ref 11.5–15.5)
WBC: 5.8 10*3/uL (ref 4.0–10.5)
nRBC: 0 % (ref 0.0–0.2)

## 2022-07-24 NOTE — Progress Notes (Signed)
PROGRESS NOTE        PATIENT DETAILS Name: Caitlin Maddox Age: 42 y.o. Sex: female Date of Birth: 1980/12/23 Admit Date: 07/22/2022 Admitting Physician Evalee Mutton Kristeen Mans, MD [id number], [place] Cage, NP  Brief Summary: Patient is a 42 y.o.  female with history of fibroid uterus-menorrhagia-who presented to a local urgent care on 3/10 with fever of 101 F, dysuria-she was given a dose of Rocephin-urine/vaginal swab was taken and she was sent home.  She was subsequently found to have a hemoglobin of 5, urine culture was positive for ESBL E. coli-she was subsequently referred to the ED for admission.  Significant events: 3/13>> admit to [facility]  Significant studies: 3/13>> abdominal ultrasound/Doppler ultrasound of ovaries: Fibroid uterus  Significant microbiology data: None  Procedures: None  Consults: None  Subjective: Feels better-less pelvic pain and less bilateral flank pain.  Afebrile overnight.  Objective: Vitals: Blood pressure 126/81, pulse 79, temperature 98.2 F (36.8 C), temperature source Oral, resp. rate 18, height 6' (1.829 m), weight 97.5 kg, last menstrual period 07/10/2022, SpO2 100 %.   Exam: Gen Exam:Alert awake-not in any distress HEENT:atraumatic, normocephalic Chest: B/L clear to auscultation anteriorly CVS:S1S2 regular Abdomen:soft non tender, non distended Extremities:no edema Neurology: Non focal Skin: no rash  Pertinent Labs/Radiology:    Latest Ref Rng & Units 07/24/2022    4:59 AM 07/23/2022    7:23 AM 07/22/2022   12:44 PM  CBC  WBC 4.0 - 10.5 K/uL 5.8  5.5  6.1   Hemoglobin 12.0 - 15.0 g/dL 6.7  6.6  5.6   Hematocrit 36.0 - 46.0 % 23.3  22.6  21.0   Platelets 150 - 400 K/uL 123  235  311     Lab Results  Component Value Date   NA 137 07/22/2022   K 3.8 07/22/2022   CL 106 07/22/2022   CO2 23 07/22/2022      Assessment/Plan: ESBL E. coli cystitis-possible early pyelonephritis Improved-afebrile  overnight-now with minimal bilateral flank pain/pelvic pain Continue IV meropenem   Severe microcytic anemia due to chronic blood loss due to menorrhagia from fibroid uterus Hemoglobin improved-6.7 this morning after 2 units of PRBC IV iron per pharmacy GYN evaluation as outpatient  Trichomoniasis/bacterial vaginitis Flagyl x 7 days  BMI: Estimated body mass index is 29.16 kg/m as calculated from the following:   Height as of this encounter: 6' (1.829 m).   Weight as of this encounter: 97.5 kg.   Code status:   Code Status: Full Code   DVT Prophylaxis: SCDs Start: 07/23/22 0013   Family Communication: None at bedside  Disposition Plan: Status is: Observation The patient will require care spanning > 2 midnights and should be moved to inpatient because: Severity of illness-needs inpatient status-as she has early pyelonephritis from ESBL E. coli UTI.   Planned Discharge Destination:Home   Diet: Diet Order             Diet regular Room service appropriate? Yes; Fluid consistency: Thin  Diet effective now                     Antimicrobial agents: Anti-infectives (From admission, onward)    Start     Dose/Rate Route Frequency Ordered Stop   07/23/22 0000  meropenem (MERREM) 1 g in sodium chloride 0.9 % 100 mL IVPB  1 g 200 mL/hr over 30 Minutes Intravenous Every 8 hours 07/22/22 2350     07/23/22 0000  metroNIDAZOLE (FLAGYL) tablet 500 mg        500 mg Oral Every 12 hours 07/22/22 2350 07/29/22 2159   07/22/22 2245  sulfamethoxazole-trimethoprim (BACTRIM DS) 800-160 MG per tablet 1 tablet  Status:  Discontinued        1 tablet Oral  Once 07/22/22 2230 07/22/22 2350        MEDICATIONS: Scheduled Meds:  metroNIDAZOLE  500 mg Oral Q12H   Continuous Infusions:  ferric gluconate (FERRLECIT) IVPB 250 mg (07/23/22 1353)   meropenem (MERREM) IV 1 g (07/24/22 0627)   PRN Meds:.acetaminophen **OR** acetaminophen, ondansetron **OR** ondansetron (ZOFRAN) IV,  oxyCODONE, senna-docusate   I have personally reviewed following labs and imaging studies  LABORATORY DATA: CBC: Recent Labs  Lab 07/19/22 0917 07/22/22 1244 07/23/22 0723 07/24/22 0459  WBC 4.9 6.1 5.5 5.8  HGB 5.0* 5.6* 6.6* 6.7*  HCT 19.8* 21.0* 22.6* 23.3*  MCV 70* 70.0* 70.8* 71.9*  PLT 443 311 235 123*     Basic Metabolic Panel: Recent Labs  Lab 07/19/22 0917 07/22/22 1244  NA 140 137  K 3.8 3.8  CL 102 106  CO2 24 23  GLUCOSE 96 83  BUN 4* 5*  CREATININE 0.87 0.81  CALCIUM 9.3 9.0     GFR: Estimated Creatinine Clearance: 119.6 mL/min (by C-G formula based on SCr of 0.81 mg/dL).  Liver Function Tests: Recent Labs  Lab 07/19/22 0917 07/22/22 1244  AST 14 17  ALT 10 12  ALKPHOS 52 49  BILITOT 0.2 <0.1*  PROT 7.7 8.6*  ALBUMIN 4.0 3.9    No results for input(s): "LIPASE", "AMYLASE" in the last 168 hours. No results for input(s): "AMMONIA" in the last 168 hours.  Coagulation Profile: No results for input(s): "INR", "PROTIME" in the last 168 hours.  Cardiac Enzymes: No results for input(s): "CKTOTAL", "CKMB", "CKMBINDEX", "TROPONINI" in the last 168 hours.  BNP (last 3 results) No results for input(s): "PROBNP" in the last 8760 hours.  Lipid Profile: No results for input(s): "CHOL", "HDL", "LDLCALC", "TRIG", "CHOLHDL", "LDLDIRECT" in the last 72 hours.  Thyroid Function Tests: No results for input(s): "TSH", "T4TOTAL", "FREET4", "T3FREE", "THYROIDAB" in the last 72 hours.  Anemia Panel: Recent Labs    07/23/22 0723  FERRITIN 2*  TIBC 386  IRON 25*     Urine analysis:    Component Value Date/Time   COLORURINE YELLOW 05/21/2022 2006   APPEARANCEUR HAZY (A) 05/21/2022 2006   LABSPEC 1.009 05/21/2022 2006   PHURINE 7.0 05/21/2022 2006   GLUCOSEU NEGATIVE 05/21/2022 2006   HGBUR SMALL (A) 05/21/2022 2006   BILIRUBINUR negative 07/19/2022 0840   KETONESUR negative 07/19/2022 0840   KETONESUR NEGATIVE 05/21/2022 2006   PROTEINUR  >=300 (A) 07/19/2022 0840   PROTEINUR NEGATIVE 05/21/2022 2006   UROBILINOGEN 0.2 07/19/2022 0840   NITRITE Negative 07/19/2022 0840   NITRITE NEGATIVE 05/21/2022 2006   LEUKOCYTESUR Large (3+) (A) 07/19/2022 0840   LEUKOCYTESUR MODERATE (A) 05/21/2022 2006    Sepsis Labs: Lactic Acid, Venous    Component Value Date/Time   LATICACIDVEN 1.2 04/08/2021 1105    MICROBIOLOGY: Recent Results (from the past 240 hour(s))  Urine Culture     Status: Abnormal   Collection Time: 07/19/22  8:58 AM   Specimen: Urine, Clean Catch  Result Value Ref Range Status   Specimen Description URINE, CLEAN CATCH  Final   Special Requests  Final    NONE Performed at [facility], [place] [address], [place], [place] [address]    Culture (A)  Final    50,000 COLONIES/mL ESCHERICHIA COLI Confirmed Extended Spectrum Beta-Lactamase Producer (ESBL).  In bloodstream infections from ESBL organisms, carbapenems are preferred over piperacillin/tazobactam. They are shown to have a lower risk of mortality.    Report Status 07/21/2022 FINAL  Final   Organism ID, Bacteria ESCHERICHIA COLI (A)  Final      Susceptibility   Escherichia coli - MIC*    AMPICILLIN >=32 RESISTANT Resistant     CEFAZOLIN >=64 RESISTANT Resistant     CEFEPIME >=32 RESISTANT Resistant     CEFTRIAXONE >=64 RESISTANT Resistant     CIPROFLOXACIN >=4 RESISTANT Resistant     GENTAMICIN <=1 SENSITIVE Sensitive     IMIPENEM <=0.25 SENSITIVE Sensitive     NITROFURANTOIN 64 INTERMEDIATE Intermediate     TRIMETH/SULFA <=20 SENSITIVE Sensitive     AMPICILLIN/SULBACTAM >=32 RESISTANT Resistant     PIP/TAZO 64 INTERMEDIATE Intermediate     * 50,000 COLONIES/mL ESCHERICHIA COLI  MRSA Next Gen by PCR, Nasal     Status: None   Collection Time: 07/23/22 10:06 AM   Specimen: Nasal Mucosa; Nasal Swab  Result Value Ref Range Status   MRSA by PCR Next Gen NOT DETECTED NOT DETECTED Final    Comment: (NOTE) The GeneXpert MRSA Assay (FDA approved  for NASAL specimens only), is one component of a comprehensive MRSA colonization surveillance program. It is not intended to diagnose MRSA infection nor to guide or monitor treatment for MRSA infections. Test performance is not FDA approved in patients less than 84 years old. Performed at [facility], [place] [address], [place], [place] [address]     RADIOLOGY STUDIES/RESULTS: US Pelvis Complete  Result Date: 07/22/2022 CLINICAL DATA:  Lower abdominal pain EXAM: TRANSABDOMINAL ULTRASOUND OF PELVIS DOPPLER ULTRASOUND OF OVARIES TECHNIQUE: Transabdominal ultrasound examination of the pelvis was performed including evaluation of the uterus, ovaries, adnexal regions, and pelvic cul-de-sac. Color and duplex Doppler ultrasound was utilized to evaluate blood flow to the ovaries. COMPARISON:  05/21/2022 FINDINGS: Uterus Measurements: 13.6 x 4.1 x 7.9 cm = volume: 118.4 mL. There are 2 fibroid seen within the anterior aspect of the uterine body. An exophytic 3.9 x 3.9 x 5.1 cm fibroid is seen superiorly. 3.7 x 3.5 x 3.2 cm intramural fibroid is seen within the lower uterine segment. Endometrium Thickness: 11.8 mm.  No focal abnormality visualized. Right ovary Measurements: 3.2 x 2.9 x 2.0 cm = volume: 10.0 mL. Normal appearance/no adnexal mass. Left ovary Measurements: 2 3.1 x 1.8 x 1.8 cm = volume: 5.4 mL. Normal appearance/no adnexal mass. Pulsed Doppler evaluation demonstrates normal low-resistance arterial and venous waveforms in both ovaries. Other: No free fluid. IMPRESSION: 1. Uterine fibroids as above. 2. Otherwise unremarkable pelvic ultrasound. Electronically Signed   By: Randa Ngo M.D.   On: 07/22/2022 21:31   [place] Art/Ven Flow Abd Pelv Doppler  Result Date: 07/22/2022 CLINICAL DATA:  Lower abdominal pain EXAM: TRANSABDOMINAL ULTRASOUND OF PELVIS DOPPLER ULTRASOUND OF OVARIES TECHNIQUE: Transabdominal ultrasound examination of the pelvis was performed including evaluation of the uterus,  ovaries, adnexal regions, and pelvic cul-de-sac. Color and duplex Doppler ultrasound was utilized to evaluate blood flow to the ovaries. COMPARISON:  05/21/2022 FINDINGS: Uterus Measurements: 13.6 x 4.1 x 7.9 cm = volume: 118.4 mL. There are 2 fibroid seen within the anterior aspect of the uterine body. An exophytic 3.9 x  3.9 x 5.1 cm fibroid is seen superiorly. 3.7 x 3.5 x 3.2 cm intramural fibroid is seen within the lower uterine segment. Endometrium Thickness: 11.8 mm.  No focal abnormality visualized. Right ovary Measurements: 3.2 x 2.9 x 2.0 cm = volume: 10.0 mL. Normal appearance/no adnexal mass. Left ovary Measurements: 2 3.1 x 1.8 x 1.8 cm = volume: 5.4 mL. Normal appearance/no adnexal mass. Pulsed Doppler evaluation demonstrates normal low-resistance arterial and venous waveforms in both ovaries. Other: No free fluid. IMPRESSION: 1. Uterine fibroids as above. 2. Otherwise unremarkable pelvic ultrasound. Electronically Signed   By: Randa Ngo M.D.   On: 07/22/2022 21:31     LOS: 1 day   Oren Binet, MD  [facility]    To contact the attending provider between 7A-7P or the covering provider during after hours 7P-7A, please log into the web site [email or web address] and access using universal [place] password for that web site. If you do not have the password, please call the hospital operator.  07/24/2022, 9:30 AM

## 2022-07-25 DIAGNOSIS — D259 Leiomyoma of uterus, unspecified: Secondary | ICD-10-CM | POA: Diagnosis not present

## 2022-07-25 DIAGNOSIS — D649 Anemia, unspecified: Secondary | ICD-10-CM | POA: Diagnosis not present

## 2022-07-25 DIAGNOSIS — N390 Urinary tract infection, site not specified: Secondary | ICD-10-CM | POA: Diagnosis not present

## 2022-07-25 DIAGNOSIS — A599 Trichomoniasis, unspecified: Secondary | ICD-10-CM | POA: Diagnosis not present

## 2022-07-25 LAB — CBC
HCT: 24.5 % — ABNORMAL LOW (ref 36.0–46.0)
Hemoglobin: 6.9 g/dL — CL (ref 12.0–15.0)
MCH: 20.8 pg — ABNORMAL LOW (ref 26.0–34.0)
MCHC: 28.2 g/dL — ABNORMAL LOW (ref 30.0–36.0)
MCV: 73.8 fL — ABNORMAL LOW (ref 80.0–100.0)
Platelets: 242 10*3/uL (ref 150–400)
RBC: 3.32 MIL/uL — ABNORMAL LOW (ref 3.87–5.11)
RDW: 22.1 % — ABNORMAL HIGH (ref 11.5–15.5)
WBC: 7.1 10*3/uL (ref 4.0–10.5)
nRBC: 0.3 % — ABNORMAL HIGH (ref 0.0–0.2)

## 2022-07-25 MED ORDER — METRONIDAZOLE 500 MG PO TABS: 500.0000 mg | ORAL_TABLET | Freq: Two times a day (BID) | ORAL | 0 refills | Status: AC

## 2022-07-25 MED ORDER — SULFAMETHOXAZOLE-TRIMETHOPRIM 800-160 MG PO TABS: 1.0000 | ORAL_TABLET | Freq: Two times a day (BID) | ORAL | 0 refills | Status: AC

## 2022-07-25 MED ORDER — FERROUS SULFATE 325 (65 FE) MG PO TABS: 325.0000 mg | ORAL_TABLET | Freq: Two times a day (BID) | ORAL | 1 refills | Status: DC

## 2022-07-25 NOTE — Discharge Summary (Signed)
PATIENT DETAILS Name: Caitlin Maddox Age: 42 y.o. Sex: female Date of Birth: August 19, 1980 MRN: [id number]. Admitting Physician: Jonetta Osgood, MD [id number], Milford Cage, NP  Admit Date: 07/22/2022 Discharge date: 07/25/2022  Recommendations for Outpatient Follow-up:  Follow up with PCP in 1-2 weeks Please obtain CMP/CBC in one week Please ensure follow-up with GYN for fibroid uterus  Admitted From:  Home  Disposition: Home   Discharge Condition: fair  CODE STATUS:   Code Status: Full Code   Diet recommendation:  Diet Order             Diet - low sodium heart healthy           Diet regular Room service appropriate? Yes; Fluid consistency: Thin  Diet effective now                    Brief Summary: Patient is a 42 y.o.  female with history of fibroid uterus-menorrhagia-who presented to a local urgent care on 3/10 with fever of 101 F, dysuria-she was given a dose of Rocephin-urine/vaginal swab was taken and she was sent home.  She was subsequently found to have a hemoglobin of 5, urine culture was positive for ESBL E. coli-she was subsequently referred to the ED for admission.   Significant events: 3/13>> admit to [facility]   Significant studies: 3/13>> abdominal ultrasound/Doppler ultrasound of ovaries: Fibroid uterus   Significant microbiology data: None   Procedures: None   Consults: None  Brief Hospital Course: ESBL E. coli cystitis-possible early pyelonephritis Improved-hardly any pelvic pain or flank pain Treated with meropenem Never febrile during this hospitalization-no leukocytosis Since clinically improved-Case was discussed with Dr. Mitzi Hansen Wallace-infectious disease-will switch to Bactrim (ESBL E. coli sensitive to Bactrim)-and will complete a course of 7 days treatment.    Severe microcytic anemia due to chronic blood loss due to menorrhagia from fibroid uterus Hemoglobin improved-to 6.9-she is currently asymptomatic.  She required 2 units of  PRBC transfusion She was treated with IV iron per pharmacy She will continue on oral iron supplementation Spoke with GYN MD on-call-she will need outpatient follow-up-patient aware that she will need to call the office on Monday to schedule an appointment.     Trichomoniasis/bacterial vaginitis Continue Flagyl x 7 days total on discharge.   BMI: Estimated body mass index is 29.16 kg/m as calculated from the following:   Height as of this encounter: 6' (1.829 m).   Weight as of this encounter: 97.5 kg.    Discharge Diagnoses:  Principal Problem:   Symptomatic anemia Active Problems:   Urinary tract infection due to extended-spectrum beta lactamase (ESBL) producing Escherichia coli   Anemia due to blood loss, chronic   Fibroid uterus   Bacterial vaginitis   Trichomoniasis   Anemia   Discharge Instructions:  Activity:  As tolerated    Discharge Instructions     Call MD for:  persistant nausea and vomiting   Complete by: As directed    Call MD for:  severe uncontrolled pain   Complete by: As directed    Diet - low sodium heart healthy   Complete by: As directed    Discharge instructions   Complete by: As directed    Follow with Primary MD  Kerin Perna, NP in 1-2 weeks  Please call the GYN office on Monday to arrange for a follow-up appointment.  The number is in the follow-up section of your discharge paperwork.  Please get a complete blood count and chemistry panel  checked by your Primary MD at your next visit, and again as instructed by your Primary MD.  Get Medicines reviewed and adjusted: Please take all your medications with you for your next visit with your Primary MD  Laboratory/radiological data: Please request your Primary MD to go over all hospital tests and procedure/radiological results at the follow up, please ask your Primary MD to get all Hospital records sent to his/her office.  In some cases, they will be blood work, cultures and biopsy  results pending at the time of your discharge. Please request that your primary care M.D. follows up on these results.  Also Note the following: If you experience worsening of your admission symptoms, develop shortness of breath, life threatening emergency, suicidal or homicidal thoughts you must seek medical attention immediately by calling 911 or calling your MD immediately  if symptoms less severe.  You must read complete instructions/literature along with all the possible adverse reactions/side effects for all the Medicines you take and that have been prescribed to you. Take any new Medicines after you have completely understood and accpet all the possible adverse reactions/side effects.   Do not drive when taking Pain medications or sleeping medications (Benzodaizepines)  Do not take more than prescribed Pain, Sleep and Anxiety Medications. It is not advisable to combine anxiety,sleep and pain medications without talking with your primary care practitioner  Special Instructions: If you have smoked or chewed Tobacco  in the last 2 yrs please stop smoking, stop any regular Alcohol  and or any Recreational drug use.  Wear Seat belts while driving.  Please note: You were cared for by a hospitalist during your hospital stay. Once you are discharged, your primary care physician will handle any further medical issues. Please note that NO REFILLS for any discharge medications will be authorized once you are discharged, as it is imperative that you return to your primary care physician (or establish a relationship with a primary care physician if you do not have one) for your post hospital discharge needs so that they can reassess your need for medications and monitor your lab values.   Increase activity slowly   Complete by: As directed       Allergies as of 07/25/2022       Reactions   Clindamycin/lincomycin Anaphylaxis, Swelling        Medication List     STOP taking these medications     amoxicillin-clavulanate 875-125 MG tablet Commonly known as: AUGMENTIN   busPIRone 5 MG tablet Commonly known as: BUSPAR   cyanocobalamin 500 MCG tablet Commonly known as: VITAMIN B12   ondansetron 8 MG disintegrating tablet Commonly known as: ZOFRAN-ODT   oxyCODONE-acetaminophen 5-325 MG tablet Commonly known as: Percocet       TAKE these medications    ferrous sulfate 325 (65 FE) MG tablet Commonly known as: FerrouSul Take 1 tablet (325 mg total) by mouth 2 (two) times daily.   metroNIDAZOLE 500 MG tablet Commonly known as: FLAGYL Take 1 tablet (500 mg total) by mouth every 12 (twelve) hours for 9 doses.   sulfamethoxazole-trimethoprim 800-160 MG tablet Commonly known as: BACTRIM DS Take 1 tablet by mouth 2 (two) times daily for 4 days.        Follow-up [facility] [place]. Call in 2 day(s).   Specialty: Obstetrics and Gynecology Why: Call on Monday to make a follow up appointment. Contact information: [address], [place]  [place] [phone]        Kerin Perna, NP. Schedule an appointment as soon as possible for a visit in 1 week(s).   Specialty: Internal Medicine Contact information: [address] [place] [address] [phone]                Allergies  Allergen Reactions   Clindamycin/Lincomycin Anaphylaxis and Swelling     Other Procedures/Studies: US Pelvis Complete  Result Date: 07/22/2022 CLINICAL DATA:  Lower abdominal pain EXAM: TRANSABDOMINAL ULTRASOUND OF PELVIS DOPPLER ULTRASOUND OF OVARIES TECHNIQUE: Transabdominal ultrasound examination of the pelvis was performed including evaluation of the uterus, ovaries, adnexal regions, and pelvic cul-de-sac. Color and duplex Doppler ultrasound was utilized to evaluate blood flow to the ovaries. COMPARISON:  05/21/2022 FINDINGS: Uterus Measurements: 13.6 x 4.1 x 7.9 cm = volume: 118.4  mL. There are 2 fibroid seen within the anterior aspect of the uterine body. An exophytic 3.9 x 3.9 x 5.1 cm fibroid is seen superiorly. 3.7 x 3.5 x 3.2 cm intramural fibroid is seen within the lower uterine segment. Endometrium Thickness: 11.8 mm.  No focal abnormality visualized. Right ovary Measurements: 3.2 x 2.9 x 2.0 cm = volume: 10.0 mL. Normal appearance/no adnexal mass. Left ovary Measurements: 2 3.1 x 1.8 x 1.8 cm = volume: 5.4 mL. Normal appearance/no adnexal mass. Pulsed Doppler evaluation demonstrates normal low-resistance arterial and venous waveforms in both ovaries. Other: No free fluid. IMPRESSION: 1. Uterine fibroids as above. 2. Otherwise unremarkable pelvic ultrasound. Electronically Signed   By: Randa Ngo M.D.   On: 07/22/2022 21:31   [place] Art/Ven Flow Abd Pelv Doppler  Result Date: 07/22/2022 CLINICAL DATA:  Lower abdominal pain EXAM: TRANSABDOMINAL ULTRASOUND OF PELVIS DOPPLER ULTRASOUND OF OVARIES TECHNIQUE: Transabdominal ultrasound examination of the pelvis was performed including evaluation of the uterus, ovaries, adnexal regions, and pelvic cul-de-sac. Color and duplex Doppler ultrasound was utilized to evaluate blood flow to the ovaries. COMPARISON:  05/21/2022 FINDINGS: Uterus Measurements: 13.6 x 4.1 x 7.9 cm = volume: 118.4 mL. There are 2 fibroid seen within the anterior aspect of the uterine body. An exophytic 3.9 x 3.9 x 5.1 cm fibroid is seen superiorly. 3.7 x 3.5 x 3.2 cm intramural fibroid is seen within the lower uterine segment. Endometrium Thickness: 11.8 mm.  No focal abnormality visualized. Right ovary Measurements: 3.2 x 2.9 x 2.0 cm = volume: 10.0 mL. Normal appearance/no adnexal mass. Left ovary Measurements: 2 3.1 x 1.8 x 1.8 cm = volume: 5.4 mL. Normal appearance/no adnexal mass. Pulsed Doppler evaluation demonstrates normal low-resistance arterial and venous waveforms in both ovaries. Other: No free fluid. IMPRESSION: 1. Uterine fibroids as above. 2. Otherwise  unremarkable pelvic ultrasound. Electronically Signed   By: Randa Ngo M.D.   On: 07/22/2022 21:31     TODAY-DAY OF DISCHARGE:  Subjective:   [name] today has no headache,no chest abdominal pain,no new weakness tingling or numbness, feels much better wants to go home today.   Objective:   Blood pressure 109/78, pulse 79, temperature 97.9 F (36.6 C), temperature source Oral, resp. rate 18, height 6' (1.829 m), weight 97.5 kg, last menstrual period 07/10/2022, SpO2 100 %.  Intake/Output Summary (Last 24 hours) at 07/25/2022 0926 Last data filed at 07/25/2022 0533 Gross per 24 hour  Intake 1068.63 ml  Output --  Net 1068.63 ml   Filed Weights   07/22/22 1242  Weight: 97.5 kg    Exam: Awake Alert, Oriented *3, No new F.N deficits, Normal affect .AT,PERRAL  Supple Neck,No JVD, No cervical lymphadenopathy appriciated.  Symmetrical Chest wall movement, Good air movement bilaterally, CTAB RRR,No Gallops,Rubs or new Murmurs, No Parasternal Heave +ve B.Sounds, Abd Soft, Non tender, No organomegaly appriciated, No rebound -guarding or rigidity. No Cyanosis, Clubbing or edema, No new Rash or bruise   PERTINENT RADIOLOGIC STUDIES: No results found.   PERTINENT LAB RESULTS: CBC: Recent Labs    07/24/22 0459 07/25/22 0347  WBC 5.8 7.1  HGB 6.7* 6.9*  HCT 23.3* 24.5*  PLT 123* 242   CMET CMP     Component Value Date/Time   NA 137 07/22/2022 1244   NA 140 07/19/2022 0917   K 3.8 07/22/2022 1244   CL 106 07/22/2022 1244   CO2 23 07/22/2022 1244   GLUCOSE 83 07/22/2022 1244   BUN 5 (L) 07/22/2022 1244   BUN 4 (L) 07/19/2022 0917   CREATININE 0.81 07/22/2022 1244   CREATININE 0.76 04/21/2021 1348   CALCIUM 9.0 07/22/2022 1244   PROT 8.6 (H) 07/22/2022 1244   PROT 7.7 07/19/2022 0917   ALBUMIN 3.9 07/22/2022 1244   ALBUMIN 4.0 07/19/2022 0917   AST 17 07/22/2022 1244   AST 17 04/21/2021 1348   ALT 12 07/22/2022 1244   ALT 9 04/21/2021 1348   ALKPHOS 49  07/22/2022 1244   BILITOT <0.1 (L) 07/22/2022 1244   BILITOT 0.2 07/19/2022 0917   BILITOT <0.2 (L) 04/21/2021 1348   GFRNONAA >60 07/22/2022 1244   GFRNONAA >60 04/21/2021 1348   GFRAA >60 02/16/2017 1910    GFR Estimated Creatinine Clearance: 119.6 mL/min (by C-G formula based on SCr of 0.81 mg/dL). No results for input(s): "LIPASE", "AMYLASE" in the last 72 hours. No results for input(s): "CKTOTAL", "CKMB", "CKMBINDEX", "TROPONINI" in the last 72 hours. Invalid input(s): "POCBNP" No results for input(s): "DDIMER" in the last 72 hours. No results for input(s): "HGBA1C" in the last 72 hours. No results for input(s): "CHOL", "HDL", "LDLCALC", "TRIG", "CHOLHDL", "LDLDIRECT" in the last 72 hours. No results for input(s): "TSH", "T4TOTAL", "T3FREE", "THYROIDAB" in the last 72 hours.  Invalid input(s): "FREET3" Recent Labs    07/23/22 0723  FERRITIN 2*  TIBC 386  IRON 25*   Coags: No results for input(s): "INR" in the last 72 hours.  Invalid input(s): "PT" Microbiology: Recent Results (from the past 240 hour(s))  Urine Culture     Status: Abnormal   Collection Time: 07/19/22  8:58 AM   Specimen: Urine, Clean Catch  Result Value Ref Range Status   Specimen Description URINE, CLEAN CATCH  Final   Special Requests   Final    NONE Performed at [facility], [address] [place], [place] [address]    Culture (A)  Final    50,000 COLONIES/mL ESCHERICHIA COLI Confirmed Extended Spectrum Beta-Lactamase Producer (ESBL).  In bloodstream infections from ESBL organisms, carbapenems are preferred over piperacillin/tazobactam. They are shown to have a lower risk of mortality.    Report Status 07/21/2022 FINAL  Final   Organism ID, Bacteria ESCHERICHIA COLI (A)  Final      Susceptibility   Escherichia coli - MIC*    AMPICILLIN >=32 RESISTANT Resistant     CEFAZOLIN >=64 RESISTANT Resistant     CEFEPIME >=32 RESISTANT Resistant     CEFTRIAXONE >=64 RESISTANT Resistant      CIPROFLOXACIN >=4 RESISTANT Resistant     GENTAMICIN <=1 SENSITIVE Sensitive     IMIPENEM <=0.25 SENSITIVE Sensitive     NITROFURANTOIN 64 INTERMEDIATE Intermediate  TRIMETH/SULFA <=20 SENSITIVE Sensitive     AMPICILLIN/SULBACTAM >=32 RESISTANT Resistant     PIP/TAZO 64 INTERMEDIATE Intermediate     * 50,000 COLONIES/mL ESCHERICHIA COLI  MRSA Next Gen by PCR, Nasal     Status: None   Collection Time: 07/23/22 10:06 AM   Specimen: Nasal Mucosa; Nasal Swab  Result Value Ref Range Status   MRSA by PCR Next Gen NOT DETECTED NOT DETECTED Final    Comment: (NOTE) The GeneXpert MRSA Assay (FDA approved for NASAL specimens only), is one component of a comprehensive MRSA colonization surveillance program. It is not intended to diagnose MRSA infection nor to guide or monitor treatment for MRSA infections. Test performance is not FDA approved in patients less than 38 years old. Performed at [facility], [place] [address] [place], [place] [address]     FURTHER DISCHARGE INSTRUCTIONS:  Get Medicines reviewed and adjusted: Please take all your medications with you for your next visit with your Primary MD  Laboratory/radiological data: Please request your Primary MD to go over all hospital tests and procedure/radiological results at the follow up, please ask your Primary MD to get all Hospital records sent to his/her office.  In some cases, they will be blood work, cultures and biopsy results pending at the time of your discharge. Please request that your primary care M.D. goes through all the records of your hospital data and follows up on these results.  Also Note the following: If you experience worsening of your admission symptoms, develop shortness of breath, life threatening emergency, suicidal or homicidal thoughts you must seek medical attention immediately by calling 911 or calling your MD immediately  if symptoms less severe.  You must read complete instructions/literature  along with all the possible adverse reactions/side effects for all the Medicines you take and that have been prescribed to you. Take any new Medicines after you have completely understood and accpet all the possible adverse reactions/side effects.   Do not drive when taking Pain medications or sleeping medications (Benzodaizepines)  Do not take more than prescribed Pain, Sleep and Anxiety Medications. It is not advisable to combine anxiety,sleep and pain medications without talking with your primary care practitioner  Special Instructions: If you have smoked or chewed Tobacco  in the last 2 yrs please stop smoking, stop any regular Alcohol  and or any Recreational drug use.  Wear Seat belts while driving.  Please note: You were cared for by a hospitalist during your hospital stay. Once you are discharged, your primary care physician will handle any further medical issues. Please note that NO REFILLS for any discharge medications will be authorized once you are discharged, as it is imperative that you return to your primary care physician (or establish a relationship with a primary care physician if you do not have one) for your post hospital discharge needs so that they can reassess your need for medications and monitor your lab values.  Total Time spent coordinating discharge including counseling, education and face to face time equals greater than 30 minutes.  SignedOren Binet 07/25/2022 9:26 AM

## 2022-07-25 NOTE — Progress Notes (Signed)
Patient received all discharge information, all questions answered at this time, patient belongings were packed up by patient and taken with them upon departure. Patient declined wheelchair and ambulated off unit.

## 2022-07-27 ENCOUNTER — Telehealth (INDEPENDENT_AMBULATORY_CARE_PROVIDER_SITE_OTHER): Payer: Self-pay

## 2022-07-27 NOTE — Transitions of Care (Post Inpatient/ED Visit) (Signed)
   07/27/2022  Name: Caitlin Maddox MRN: [id number] DOB: 17-Jan-1981  Today's TOC FU Call Status: Today's TOC FU Call Status:: Successful TOC FU Call Competed TOC FU Call Complete Date: 07/27/22  Transition Care Management Follow-up Telephone Call Date of Discharge: 07/25/22 Discharge [facility]) Type of Discharge: Inpatient Admission Primary Inpatient Discharge Diagnosis:: Symptomatic anemia How have you been since you were released from the hospital?: Better Any questions or concerns?: No  Items Reviewed: Did you receive and understand the discharge instructions provided?: Yes Medications obtained and verified?: Yes (Medications Reviewed) Any new allergies since your discharge?: No Dietary orders reviewed?: Yes Do you have support at home?: Yes  Home Care and Equipment/Supplies: [place] Ordered?: No Any new equipment or medical supplies ordered?: No  Functional Questionnaire: Do you need assistance with bathing/showering or dressing?: No Do you need assistance with meal preparation?: No Do you need assistance with eating?: No Do you have difficulty maintaining continence: No Do you need assistance with getting out of bed/getting out of a chair/moving?: No Do you have difficulty managing or taking your medications?: No  Folllow up appointments reviewed: PCP Follow-up appointment confirmed?: Yes Date of PCP follow-up appointment?: 08/04/22 Follow-up Provider: Juluis Mire NP [facility] Follow-up appointment confirmed?: No Follow-Up Specialty Provider:: OBGYN Reason Specialist Follow-Up Not Confirmed: Patient has Specialist Provider Number and will Call for Appointment Do you need transportation to your follow-up appointment?: No Do you understand care options if your condition(s) worsen?: Yes-patient verbalized understanding    Lena LPN [place] Direct Dial [phone]

## 2022-08-04 ENCOUNTER — Encounter (INDEPENDENT_AMBULATORY_CARE_PROVIDER_SITE_OTHER): Payer: Self-pay | Admitting: Primary Care

## 2022-08-04 ENCOUNTER — Ambulatory Visit (INDEPENDENT_AMBULATORY_CARE_PROVIDER_SITE_OTHER): Payer: 59 | Admitting: Primary Care

## 2022-08-04 ENCOUNTER — Telehealth (INDEPENDENT_AMBULATORY_CARE_PROVIDER_SITE_OTHER): Payer: Self-pay | Admitting: Licensed Clinical Social Worker

## 2022-08-04 VITALS — BP 125/84 | HR 81 | Temp 98.0°F | Ht 72.0 in | Wt 178.2 lb

## 2022-08-04 DIAGNOSIS — F418 Other specified anxiety disorders: Secondary | ICD-10-CM

## 2022-08-04 DIAGNOSIS — N939 Abnormal uterine and vaginal bleeding, unspecified: Secondary | ICD-10-CM | POA: Diagnosis not present

## 2022-08-04 DIAGNOSIS — Z09 Encounter for follow-up examination after completed treatment for conditions other than malignant neoplasm: Secondary | ICD-10-CM | POA: Diagnosis not present

## 2022-08-04 DIAGNOSIS — H6121 Impacted cerumen, right ear: Secondary | ICD-10-CM | POA: Diagnosis not present

## 2022-08-04 NOTE — Telephone Encounter (Signed)
LCSWA called patient today to introduce herself and to assess patients' mental health needs. Patient did not answer the phone. LCSWA was able to leave a brief message with the patient asking them to return the call. Patient was referred by PCP for depression.

## 2022-08-04 NOTE — Progress Notes (Signed)
Having pain in both legs

## 2022-08-04 NOTE — Progress Notes (Addendum)
[facility]   Subjective:  [name] is a 42 y.o. female presents for hospital follow up .  She presented to the UC for dysuria- she received tx and went home. On Wednesday she received a call from UC that she needed to go to the hospital because her H/H was severely low. Admit date to the hospital was 07/22/22, patient was discharged from the hospital on 07/25/22, patient was admitted for: symptomatic anemia, anemia due to blood loss chronic, fibroid uterus, urinary tract infection, bacterial vaginosis and trichomonas .  Patient received 2 units of packed red blood cells, she was also treated for urinary tract infection and trichomonas-stable discharge to home.  Today she has concerns about bilateral leg pain with swelling at times which started a week ago.  She denies falls or trauma or any strenuous exercises. Patient has No headache, No chest pain, No abdominal pain - No Nausea, No new weakness tingling or numbness, No Cough - shortness of breath    Past Medical History:  Diagnosis Date   Cancer ([place])    Depression    Dysfunctional uterine bleeding    Pelvic mass in female    Retroperitoneal lymphadenopathy    Symptomatic anemia      Allergies  Allergen Reactions   Clindamycin/Lincomycin Anaphylaxis and Swelling      Current Outpatient Medications on File Prior to Visit  Medication Sig Dispense Refill   ferrous sulfate (FERROUSUL) 325 (65 FE) MG tablet Take 1 tablet (325 mg total) by mouth 2 (two) times daily. 60 tablet 1   No current facility-administered medications on file prior to visit.     Review of System: Comprehensive ROS Pertinent positive and negative noted in HPI    Objective:  Blood Pressure 125/84   Pulse 81   Temperature 98 F (36.7 C) (Oral)   Height 6' (1.829 m)   Weight 178 lb 3.2 oz (80.8 kg)   Last Menstrual Period 07/10/2022 (Exact Date)   Oxygen Saturation 100%   Body Mass Index 24.17 kg/m   Filed Weights   08/04/22 1007   Weight: 178 lb 3.2 oz (80.8 kg)    Physical Exam: General Appearance: Well nourished, in no apparent distress. Eyes: PERRLA, EOMs, conjunctiva no swelling or erythema Sinuses: No Frontal/maxillary tenderness ENT/Mouth: Ext aud canals clear left ,  right cerumen impaction .TMs without erythema, bulging. No erythema, swelling, or exudate on post pharynx.  Tonsils not swollen or erythematous. Hearing normal.  Neck: Supple, thyroid normal.  Respiratory: Respiratory effort normal, BS equal bilaterally without rales, rhonchi, wheezing or stridor.  Cardio: RRR with no MRGs. Brisk peripheral pulses without edema.  Abdomen: Soft, + BS.  Non tender, no guarding, rebound, hernias, masses. Lymphatics: Non tender without lymphadenopathy.  Musculoskeletal: Full ROM, 5/5 strength, normal gait.  Skin: Warm, dry without rashes, lesions, ecchymosis.  Neuro: Cranial nerves intact. Normal muscle tone, no cerebellar symptoms. Sensation intact.  Psych: Awake and oriented X 3, normal affect, Insight and Judgment appropriate.    Assessment:  Mi was seen today for hospitalization follow-up.  Diagnoses and all orders for this visit:  Hospital discharge follow-up  Call [facility] [place] [facility] ([facility]) in 2 days (07/27/2022); Call on Monday to make a follow up appointment. Patient given appt 09/07/22 will send in another referral as urgent to avoid AUB/anemia Schedule an appointment with Kerin Perna, NP (Internal Medicine) in 1 week (08/01/2022)  Depression with anxiety Flowsheet Row Office Visit from 08/04/2022  in [place]  PHQ-9 Total Score 21      Refr to CSW  Abnormal uterine bleeding Urgent GYN visit   Impacted cerumen of right ear  Schedule ear irrigation  This note has been created with Dragon speech recognition software and smart Company secretary. Any transcriptional errors are unintentional.   Kerin Perna, NP 08/04/2022, 10:31 AM

## 2022-09-07 ENCOUNTER — Encounter: Payer: Self-pay | Admitting: Obstetrics and Gynecology

## 2022-09-07 ENCOUNTER — Ambulatory Visit (INDEPENDENT_AMBULATORY_CARE_PROVIDER_SITE_OTHER): Payer: Self-pay | Admitting: Obstetrics and Gynecology

## 2022-09-07 VITALS — BP 113/71 | HR 73 | Ht 72.0 in | Wt 195.0 lb

## 2022-09-07 DIAGNOSIS — D500 Iron deficiency anemia secondary to blood loss (chronic): Secondary | ICD-10-CM

## 2022-09-07 MED ORDER — MEGESTROL ACETATE 40 MG PO TABS: 40.0000 mg | ORAL_TABLET | Freq: Two times a day (BID) | ORAL | 5 refills | Status: DC

## 2022-09-07 NOTE — Progress Notes (Signed)
Ms Deike presents for eval of [facility], uterine fibroids and IDA.  Pt reports monthly [facility] for as long as she can remember. She previously had an IUD but had to be removed due to [facility]. She reports that her cycles were still heavy with the IUD Cycles last 7-10 days. Changes pads q 30 minutes to q 1 hr. Has  had to received blood transfusions due to her IDA which is thought to be related to her [facility] GYN U/S 3/24, 119 gms with uterine fibroids   Pap smear UTD H/O TSVD followed by LTCS x 2 [id number] ( H/O twins)  Denies any chronic medical problems or medications  Denies any CP, SOB, bowel or bladder dysfunction  Sexual active, condoms.  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 Pt declines medical management. Desires definite therapy. TVH with possible BS reviewed with pt. R/B/Post op care discussed with pt. Hysterectomy papers signed. Information provided.  Will start Megace 40 mg bid.  F/U with post op appt.

## 2022-09-07 NOTE — Progress Notes (Signed)
42 y.o New GYN presents for ED follow up of Fibroids. C/o heavy bleeding, periods twice monthly; lasting 10 days each, blood clots, soaking an overnight pad in 20 minutes, abdominal pain 10/10, fatigue, headaches.  Pt had a Blood Transfusion in March.   Last PAP 04/09/2021

## 2022-09-08 ENCOUNTER — Ambulatory Visit
Admission: EM | Admit: 2022-09-08 | Discharge: 2022-09-08 | Disposition: A | Discharge status: Home / Self Care | Payer: 59 | Attending: Internal Medicine | Admitting: Internal Medicine

## 2022-09-08 DIAGNOSIS — K047 Periapical abscess without sinus: Secondary | ICD-10-CM

## 2022-09-08 MED ORDER — AMOXICILLIN-POT CLAVULANATE 875-125 MG PO TABS: 1.0000 | ORAL_TABLET | Freq: Two times a day (BID) | ORAL | 0 refills | Status: DC

## 2022-09-08 NOTE — ED Provider Notes (Signed)
[name]    CSN: [id number] Arrival date & time: 09/08/22  1120      History   Chief Complaint Chief Complaint  Patient presents with   Abscess    HPI Caitlin Maddox is a 42 y.o. female.   Patient presents with right-sided facial swelling that started yesterday.  Reports that is also painful.  Denies trauma to the face.  Denies any associated fever.  Has taken Tylenol for pain.   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 C-section x 2.           Home Medications    Prior to Admission medications   Medication Sig Start Date End Date Taking? Authorizing Provider  amoxicillin-clavulanate (AUGMENTIN) 875-125 MG tablet Take 1 tablet by mouth every 12 (twelve) hours. 09/08/22  Yes Arianie Couse, Acie Fredrickson, FNP  ferrous sulfate (FERROUSUL) 325 (65 FE) MG tablet Take 1 tablet (325 mg total) by mouth 2 (two) times daily. 07/25/22   Ghimire, Werner Lean, MD  megestrol (MEGACE) 40 MG tablet Take 1 tablet (40 mg total) by mouth 2 (two) times daily. Can increase to two tablets twice a day in the event of heavy bleeding 09/07/22   Hermina Staggers, MD    Family History Family History  Problem Relation Age of Onset    Hypertension Mother     Social History Social History   Tobacco Use   Smoking status: Former    Packs/day: 1.00    Years: 33.00    Additional pack years: 0.00    Total pack years: 33.00    Types: Cigarettes    Quit date: 10/09/2020    Years since quitting: 1.9   Smokeless tobacco: Never  Vaping Use   Vaping Use: Never used  Substance Use Topics   Alcohol use: No   Drug use: No     Allergies   Clindamycin/lincomycin   Review of Systems Review of Systems Per HPI  Physical Exam Triage Vital Signs ED Triage Vitals  Enc Vitals Group     BP 09/08/22 1134 110/72     Pulse Rate 09/08/22 1134 82     Resp 09/08/22 1134 16     Temp 09/08/22 1134 98.4 F (36.9 C)     Temp Source 09/08/22 1134 Oral     SpO2 09/08/22 1134 98 %  Weight --      Height --      Head Circumference --      Peak Flow --      Pain Score 09/08/22 1133 10     Pain Loc --      Pain Edu? --      Excl. in GC? --    No data found.  Updated Vital Signs BP 110/72 (BP Location: Left Arm)   Pulse 82   Temp 98.4 F (36.9 C) (Oral)   Resp 16   LMP 08/29/2022 (Exact Date)   SpO2 98%   Visual Acuity Right Eye Distance:   Left Eye Distance:   Bilateral Distance:    Right Eye Near:   Left Eye Near:    Bilateral Near:     Physical Exam Constitutional:      General: She is not in acute distress.    Appearance: Normal appearance. She is not toxic-appearing or diaphoretic.  HENT:     Head: Normocephalic and atraumatic.     Comments: Has moderate amount of swelling present to right lower jaw.  No discoloration noted.  Patient has abnormal dentition throughout right lower area as well with mild surrounding erythema.  No obvious purulent drainage noted. Eyes:     Extraocular Movements: Extraocular movements intact.     Conjunctiva/sclera: Conjunctivae normal.  Pulmonary:     Effort: Pulmonary effort is normal.  Neurological:     General: No focal deficit present.     Mental Status: She is  alert and oriented to person, place, and time. Mental status is at baseline.  Psychiatric:        Mood and Affect: Mood normal.        Behavior: Behavior normal.        Thought Content: Thought content normal.        Judgment: Judgment normal.      UC Treatments / Results  Labs (all labs ordered are listed, but only abnormal results are displayed) Labs Reviewed - No data to display  EKG   Radiology No results found.  Procedures Procedures (including critical care time)  Medications Ordered in UC Medications - No data to display  Initial Impression / Assessment and Plan / UC Course  I have reviewed the triage vital signs and the nursing notes.  Pertinent labs & imaging results that were available during my care of the patient were reviewed by me and considered in my medical decision making (see chart for details).     Physical exam appears consistent with dental abscess.  Will trial Augmentin antibiotic.  No signs of airway compromise on exam but patient was encouraged to go to the ER if symptoms persist or worsen.  Also advised follow-up with dentist.  Patient provided with dental resources paperwork for dentist contact.  Advised warm compresses to the outside of the face.  Patient verbalized understanding and was agreeable with plan. Final Clinical Impressions(s) / UC Diagnoses   Final diagnoses:  Dental abscess     Discharge Instructions      It appears that you have a dental abscess which is an infection.  I have prescribed an antibiotic for you to take for this.  Please monitor swelling closely and if it worsens, please go to the emergency department.  I also recommend that you follow-up with dentist for further evaluation and management.    ED Prescriptions     Medication Sig Dispense Auth. Provider   amoxicillin-clavulanate (AUGMENTIN) 875-125 MG tablet Take  1 tablet by mouth every 12 (twelve) hours. 14 tablet [employer], Acie Fredrickson, [place]      PDMP not reviewed  this encounter.   Gustavus Bryant, [place] 09/08/22 [phone]

## 2022-09-08 NOTE — Discharge Instructions (Signed)
It appears that you have a dental abscess which is an infection.  I have prescribed an antibiotic for you to take for this.  Please monitor swelling closely and if it worsens, please go to the emergency department.  I also recommend that you follow-up with dentist for further evaluation and management.

## 2022-09-08 NOTE — ED Triage Notes (Signed)
Pt present mouth swelling on the right side of her face. Symptoms started yesterday. Pt states the area is painful

## 2022-09-10 ENCOUNTER — Encounter: Payer: Self-pay | Admitting: Obstetrics and Gynecology

## 2022-09-11 ENCOUNTER — Encounter (HOSPITAL_COMMUNITY): Payer: Self-pay

## 2022-09-11 ENCOUNTER — Other Ambulatory Visit: Payer: Self-pay

## 2022-09-11 ENCOUNTER — Emergency Department (HOSPITAL_BASED_OUTPATIENT_CLINIC_OR_DEPARTMENT_OTHER): Payer: 59

## 2022-09-11 ENCOUNTER — Emergency Department (HOSPITAL_COMMUNITY)
Admission: EM | Admit: 2022-09-11 | Discharge: 2022-09-11 | Disposition: A | Discharge status: Home / Self Care | Payer: 59 | Attending: Emergency Medicine | Admitting: Emergency Medicine

## 2022-09-11 ENCOUNTER — Emergency Department (HOSPITAL_COMMUNITY): Payer: 59

## 2022-09-11 ENCOUNTER — Ambulatory Visit
Admission: EM | Admit: 2022-09-11 | Discharge: 2022-09-11 | Disposition: A | Discharge status: Home / Self Care | Payer: 59

## 2022-09-11 DIAGNOSIS — R609 Edema, unspecified: Secondary | ICD-10-CM | POA: Diagnosis not present

## 2022-09-11 DIAGNOSIS — R600 Localized edema: Secondary | ICD-10-CM | POA: Diagnosis not present

## 2022-09-11 DIAGNOSIS — M79605 Pain in left leg: Secondary | ICD-10-CM | POA: Diagnosis not present

## 2022-09-11 DIAGNOSIS — N939 Abnormal uterine and vaginal bleeding, unspecified: Secondary | ICD-10-CM | POA: Diagnosis not present

## 2022-09-11 DIAGNOSIS — R0602 Shortness of breath: Secondary | ICD-10-CM | POA: Diagnosis not present

## 2022-09-11 DIAGNOSIS — M79604 Pain in right leg: Secondary | ICD-10-CM | POA: Diagnosis not present

## 2022-09-11 DIAGNOSIS — R42 Dizziness and giddiness: Secondary | ICD-10-CM | POA: Diagnosis not present

## 2022-09-11 DIAGNOSIS — R140 Abdominal distension (gaseous): Secondary | ICD-10-CM | POA: Insufficient documentation

## 2022-09-11 LAB — BASIC METABOLIC PANEL
Anion gap: 9 (ref 5–15)
BUN: <5 mg/dL — ABNORMAL LOW (ref 6–20)
CO2: 25 mmol/L (ref 22–32)
Calcium: 9 mg/dL (ref 8.9–10.3)
Chloride: 104 mmol/L (ref 98–111)
Creatinine, Ser: 0.8 mg/dL (ref 0.44–1.00)
GFR, Estimated: >60 mL/min (ref >60)
Glucose, Bld: 111 mg/dL — ABNORMAL HIGH (ref 70–99)
Potassium: 4.1 mmol/L (ref 3.5–5.1)
Sodium: 138 mmol/L (ref 135–145)

## 2022-09-11 LAB — TROPONIN I (HIGH SENSITIVITY): Troponin I (High Sensitivity): <2 ng/L (ref <18)

## 2022-09-11 LAB — CBC WITH DIFFERENTIAL/PLATELET
Abs Immature Granulocytes: 0.02 10*3/uL (ref 0.00–0.07)
Basophils Absolute: 0 10*3/uL (ref 0.0–0.1)
Basophils Relative: 0 %
Eosinophils Absolute: 0.1 10*3/uL (ref 0.0–0.5)
Eosinophils Relative: 2 %
HCT: 26.4 % — ABNORMAL LOW (ref 36.0–46.0)
Hemoglobin: 8.4 g/dL — ABNORMAL LOW (ref 12.0–15.0)
Immature Granulocytes: 0 %
Lymphocytes Relative: 31 %
Lymphs Abs: 1.8 10*3/uL (ref 0.7–4.0)
MCH: 30.1 pg (ref 26.0–34.0)
MCHC: 31.8 g/dL (ref 30.0–36.0)
MCV: 94.6 fL (ref 80.0–100.0)
Monocytes Absolute: 0.6 10*3/uL (ref 0.1–1.0)
Monocytes Relative: 11 %
Neutro Abs: 3.3 10*3/uL (ref 1.7–7.7)
Neutrophils Relative %: 56 %
Platelets: 210 10*3/uL (ref 150–400)
RBC: 2.79 MIL/uL — ABNORMAL LOW (ref 3.87–5.11)
RDW: 20.6 % — ABNORMAL HIGH (ref 11.5–15.5)
WBC: 5.9 10*3/uL (ref 4.0–10.5)
nRBC: 0 % (ref 0.0–0.2)

## 2022-09-11 LAB — HEPATIC FUNCTION PANEL
ALT: 91 U/L — ABNORMAL HIGH (ref 0–44)
AST: 80 U/L — ABNORMAL HIGH (ref 15–41)
Albumin: 3.4 g/dL — ABNORMAL LOW (ref 3.5–5.0)
Alkaline Phosphatase: 53 U/L (ref 38–126)
Bilirubin, Direct: <0.1 mg/dL (ref 0.0–0.2)
Total Bilirubin: 0.2 mg/dL — ABNORMAL LOW (ref 0.3–1.2)
Total Protein: 7.2 g/dL (ref 6.5–8.1)

## 2022-09-11 LAB — TYPE AND SCREEN
ABO/RH(D): A POS
Antibody Screen: NEGATIVE

## 2022-09-11 LAB — BRAIN NATRIURETIC PEPTIDE: B Natriuretic Peptide: 46 pg/mL (ref 0.0–100.0)

## 2022-09-11 MED ORDER — FUROSEMIDE 20 MG PO TABS: 20.0000 mg | ORAL_TABLET | Freq: Every day | ORAL | 0 refills | Status: DC

## 2022-09-11 NOTE — ED Provider Triage Note (Signed)
Emergency Medicine Provider Triage Evaluation Note  Caitlin Maddox , a 42 y.o. female  was evaluated in triage.  Pt complains of cough and LE edema for 2 weeks with dyspnea on exertion for 2 days. States has had symptoms before when anemic and required a blood transfusion. Last ~3 months ago. Scheduled for hysterectomy later this month to treat menorrhagia. No fever, chills, chest pain, nvd, or abdominal pain. No history of blood clots.  Review of Systems  Positive: See HPI Negative: See HPI  Physical Exam  BP 138/84 (BP Location: Right Arm)   Pulse 84   Temp 98.9 F (37.2 C)   Resp 18   Ht 6' (1.829 m)   Wt 88.5 kg   LMP 08/29/2022 (Exact Date)   SpO2 98%   BMI 26.45 kg/m  Gen:   Awake, no distress   Resp:  Normal effort LCTA MSK:   Moves extremities without difficulty no LE edema Other:  Pale conjunctiva, RRR, abdomen soft and nontender  Medical Decision Making  Medically screening exam initiated at 1:18 PM.  Appropriate orders placed.  [name] was informed that the remainder of the evaluation will be completed by another provider, this initial triage assessment does not replace that evaluation, and the importance of remaining in the ED until their evaluation is complete.     Tonette Lederer, PA-C 09/11/22 1320

## 2022-09-11 NOTE — ED Triage Notes (Addendum)
Pt arrived POV sent from UC for BLE swelling, shob and bloating x 1 week. Pt reports headache as well. Pt also reporting being on her period since 08/29/22. Pt in NAD, speaking in full sentences, no labored breathing or tachypnea noted

## 2022-09-11 NOTE — Discharge Instructions (Signed)
Go straight to the emergency department as soon as you leave urgent care for further evaluation and management. 

## 2022-09-11 NOTE — ED Triage Notes (Signed)
Patient c/o SOB, bilateral leg swelling, dizziness, abdominal bloating, and a headache x 1 week. Patient also reports that she has had a period since 08/29/22 and states she saw a gynecologist and states she is suppose to have a hysterectomy.

## 2022-09-11 NOTE — ED Notes (Signed)
Patient is being discharged from the Urgent Care and sent to the Emergency Department via POV . Per Laren Everts, NP, patient is in need of higher level of care due to multiple symptoms. Patient is aware and verbalizes understanding of plan of care.  Vitals:   09/11/22 1025  BP: (!) 147/79  Pulse: 78  Resp: 16  Temp: 97.8 F (36.6 C)  SpO2: 98%

## 2022-09-11 NOTE — Discharge Instructions (Signed)
Please read and follow all provided instructions.  Your diagnoses today include:  1. Edema, unspecified type   2. Pain in both lower extremities     Tests performed today include: Blood cell counts and electrolytes: Your hemoglobin is improving into the 8 range Kidney function: Was normal Liver function test: Were slightly high today, please have this rechecked by your primary care doctor to make sure that they are going back to normal, avoid heavy amounts of Tylenol Blood test for stress on the heart was normal and blood test for heart failure was normal Ultrasounds of your legs did not demonstrate any signs of blood clot Vital signs. See below for your results today.   Medications prescribed:  Furosemide: Diuretic to take for 3 days to see if this helps with your swelling  Take any prescribed medications only as directed.  Home care instructions:  Follow any educational materials contained in this packet.  BE VERY CAREFUL not to take multiple medicines containing Tylenol (also called acetaminophen). Doing so can lead to an overdose which can damage your liver and cause liver failure and possibly death.   Follow-up instructions: Please follow-up with your primary care provider in the next 3 days for further evaluation of your symptoms.   Return instructions:  Please return to the Emergency Department if you experience worsening symptoms.  Return with worsening pain, swelling, shortness of breath Please return if you have any other emergent concerns.  Additional Information:  Your vital signs today were: BP 102/69   Pulse 74   Temp 98.5 F (36.9 C) (Oral)   Resp 11   Ht 6' (1.829 m)   Wt 88.5 kg   LMP 08/29/2022 (Exact Date)   SpO2 100%   BMI 26.45 kg/m  If your blood pressure (BP) was elevated above 135/85 this visit, please have this repeated by your doctor within one month. --------------

## 2022-09-11 NOTE — ED Provider Notes (Signed)
[place] EMERGENCY DEPARTMENT AT [facility] Provider Note   CSN: [id number] Arrival date & time: 09/11/22  1215     History  Chief Complaint  Patient presents with   Shortness of Breath    Caitlin Maddox is a 42 y.o. female.  Patient with history of blood loss anemia due to heavy vaginal bleeding, ongoing, currently on Megace -- presents with shortness of breath, bilateral lower extremity swelling, dizziness, abdominal bloating.  She was admitted in March for anemia and received transfusion.  She also had a UTI.  She reports bilateral lower extremity swelling starting this morning.  Involves her bilateral feet and ankles.  She reports aching of the bilateral calves and thighs.  She states that her family also thinks that she has been more short of breath and that she has swelling around her abdomen.  She does not really feel like this is a big problem for her.  No fever, URI symptoms, chest pain.  Shortness of breath is at baseline and more prominent when she is active.  No vomiting or diarrhea.  She states that the Megace is making her eat more.  No urinary symptoms.  She denies blood pressure medications including calcium channel blockers.  She denies history of heart problems or heart failure.  She denies history of kidney problems or liver problems.  She denies history of pulmonary embolism or DVT.  Megace was recently increased to twice a day for ongoing bleeding.  She reports that she will be getting a hysterectomy in the near future.       Home Medications Prior to Admission medications   Medication Sig Start Date End Date Taking? Authorizing Provider  amoxicillin-clavulanate (AUGMENTIN) 875-125 MG tablet Take 1 tablet by mouth every 12 (twelve) hours. 09/08/22   Gustavus Bryant, FNP  ferrous sulfate (FERROUSUL) 325 (65 FE) MG tablet Take 1 tablet (325 mg total) by mouth 2 (two) times daily. 07/25/22   Ghimire, Werner Lean, MD  megestrol (MEGACE) 40 MG tablet Take 1 tablet (40  mg total) by mouth 2 (two) times daily. Can increase to two tablets twice a day in the event of heavy bleeding 09/07/22   Hermina Staggers, MD      Allergies    Clindamycin/lincomycin    Review of Systems   Review of Systems  Physical Exam Updated Vital Signs BP 138/84 (BP Location: Right Arm)   Pulse 84   Temp 98.9 F (37.2 C)   Resp 18   Ht 6' (1.829 m)   Wt 88.5 kg   LMP 08/29/2022 (Exact Date)   SpO2 98%   BMI 26.45 kg/m  Physical Exam Vitals and nursing note reviewed.  Constitutional:      General: She is not in acute distress.    Appearance: She is well-developed.  HENT:     Head: Normocephalic and atraumatic.     Right Ear: External ear normal.     Left Ear: External ear normal.     Nose: Nose normal.  Eyes:     Conjunctiva/sclera: Conjunctivae normal.  Cardiovascular:     Rate and Rhythm: Normal rate and regular rhythm.     Heart sounds: No murmur heard. Pulmonary:     Effort: No respiratory distress.     Breath sounds: No wheezing, rhonchi or rales.  Abdominal:     Palpations: Abdomen is soft.     Tenderness: There is no abdominal tenderness. There is no guarding or rebound.  Musculoskeletal:  Cervical back: Normal range of motion and neck supple.     Right lower leg: Edema present.     Left lower leg: Edema present.     Comments: 1+ pitting edema of the bilateral feet and ankles, symmetric.  2+ DP pulses bilaterally.  Skin:    General: Skin is warm and dry.     Findings: No rash.  Neurological:     General: No focal deficit present.     Mental Status: She is alert. Mental status is at baseline.     Motor: No weakness.  Psychiatric:        Mood and Affect: Mood normal.     ED Results / Procedures / Treatments   Labs (all labs ordered are listed, but only abnormal results are displayed) Labs Reviewed  CBC WITH DIFFERENTIAL/PLATELET - Abnormal; Notable for the following components:      Result Value   RBC 2.79 (*)    Hemoglobin 8.4 (*)     HCT 26.4 (*)    RDW 20.6 (*)    All other components within normal limits  BASIC METABOLIC PANEL - Abnormal; Notable for the following components:   Glucose, Bld 111 (*)    BUN <5 (*)    All other components within normal limits  HEPATIC FUNCTION PANEL - Abnormal; Notable for the following components:   Albumin 3.4 (*)    AST 80 (*)    ALT 91 (*)    Total Bilirubin 0.2 (*)    All other components within normal limits  BRAIN NATRIURETIC PEPTIDE  TYPE AND SCREEN  TROPONIN I (HIGH SENSITIVITY)  TROPONIN I (HIGH SENSITIVITY)    EKG None  Radiology DG Chest 2 View  Result Date: 09/11/2022 CLINICAL DATA:  Shortness of breath. EXAM: CHEST - 2 VIEW COMPARISON:  April 06, 2021. FINDINGS: The heart size and mediastinal contours are within normal limits. Both lungs are clear. The visualized skeletal structures are unremarkable. IMPRESSION: No active cardiopulmonary disease. Electronically Signed   By: Lupita Raider M.D.   On: 09/11/2022 13:21    Procedures Procedures    Medications Ordered in ED Medications - No data to display  ED Course/ Medical Decision Making/ A&P    Patient seen and examined. History obtained directly from patient. Work-up including labs, imaging, EKG ordered in triage, if performed, were reviewed.    Labs/EKG: Independently reviewed and interpreted.  This included: CBC with hemoglobin of 8.4 today, MCV improved from previous, normal white blood cell count; BMP glucose 111; trop normal; BNP normal.   Imaging: Independently visualized and interpreted.  This included: Chest x-ray, agree negative.  Medications/Fluids: None.  Most recent vital signs reviewed and are as follows: BP 138/84 (BP Location: Right Arm)   Pulse 84   Temp 98.9 F (37.2 C)   Resp 18   Ht 6' (1.829 m)   Wt 88.5 kg   LMP 08/29/2022 (Exact Date)   SpO2 98%   BMI 26.45 kg/m   Initial impression: Patient with bilateral lower extremity edema in setting of anemia and ongoing  vaginal bleeding.  Vaginal bleeding seems to be close to baseline and hemoglobin is actually improved a bit today from previous.  Due to use of Megace and new bilateral lower extremity swelling, will rule out possibility of DVT, but felt overall unlikely.  Will check hepatic function panel as this was not sent on arrival.  If everything is normal, will consider 3-day course of Lasix 20 mg and have patient keep  her legs elevated when possible.  4:54 PM Reassessment performed. Patient appears stable.  Labs personally reviewed and interpreted including: Hepatic function panel with minimally elevated LFTs, albumin minimally low at 3.4, normal protein, total bili slightly low.  Imaging: DVT studies of the bilateral lower extremities negative  Reviewed pertinent lab work and imaging with patient at bedside. Questions answered.   Most current vital signs reviewed and are as follows: BP 102/69   Pulse 74   Temp 98.5 F (36.9 C) (Oral)   Resp 11   Ht 6' (1.829 m)   Wt 88.5 kg   LMP 08/29/2022 (Exact Date)   SpO2 100%   BMI 26.45 kg/m   Plan: Discharge to home.   Prescriptions written for: Lasix 20 mg daily for 3 days, take in the morning.  Other home care instructions discussed: Elevate legs, minimize standing for prolonged periods, consider compression stockings.  ED return instructions discussed: Return with worsening shortness of breath, heavier amounts of bleeding, new symptoms or other concerns  Follow-up instructions discussed: Patient encouraged to follow-up with their PCP in 3 days.                             Medical Decision Making Amount and/or Complexity of Data Reviewed Labs: ordered. Radiology: ordered.   Patient is mainly here today for bilateral lower extremity edema and associated pain causing difficulty walking.  She has pain in her calves and thighs as well.  She is on Megace for vaginal bleeding.  Overall her hemoglobin is trending up.  She is on an iron  supplement.  Patient was evaluated for signs of heart failure, liver failure, kidney failure today in the emergency department.  Other than her LFTs being a little bit high and her albumin being slightly low, her workup is reassuring.  Given Megace use, she was evaluated for DVT and this was negative.  Do not suspect emergent cause of lower extremity edema although exact etiology is unclear at this time.  The patient's vital signs, pertinent lab work and imaging were reviewed and interpreted as discussed in the ED course. Hospitalization was considered for further testing, treatments, or serial exams/observation. However as patient is well-appearing, has a stable exam, and reassuring studies today, I do not feel that they warrant admission at this time. This plan was discussed with the patient who verbalizes agreement and comfort with this plan and seems reliable and able to return to the Emergency Department with worsening or changing symptoms.          Final Clinical Impression(s) / ED Diagnoses Final diagnoses:  Edema, unspecified type  Pain in both lower extremities    Rx / DC Orders ED Discharge Orders          Ordered    furosemide (LASIX) 20 MG tablet  Daily        09/11/22 1656              Renne Crigler, PA-C 09/11/22 1700    Gerhard Munch, MD 09/11/22 2124

## 2022-09-11 NOTE — Progress Notes (Signed)
Bilateral lower extremity venous study completed.   Preliminary results relayed to RN.  Please see CV Procedures for preliminary results.  Audre Cenci, RVT  4:26 PM 09/11/22

## 2022-09-11 NOTE — ED Provider Notes (Signed)
Caitlin Maddox    CSN: [id number] Arrival date & time: 09/11/22  3244      History   Chief Complaint Chief Complaint  Patient presents with   Shortness of Breath   Dizziness   Leg Swelling   Headache   Bloated    HPI Caitlin Maddox is a 42 y.o. female.   Patient presents today with shortness of breath, bilateral lower extremity swelling, dizziness, abdominal bloating.  Patient reports that she has been having vaginal bleeding since 08/29/2022.  She was seen by gynecology on 4/29 who prescribed her Megace and scheduled hysterectomy.  She reports that the vaginal bleeding has not improved or stop since starting the Megace.  She reports that she started having some significant abdominal bloating over the past week as well and is not sure if this is related.  She started also having some bilateral lower extremity swelling with associated pain and dizziness that started about a week ago.  She also developed some shortness of breath that started last night.  Denies chest pain, headache, blurred vision.  Patient was recently hospitalized in March for low hemoglobin.  Denies any other pertinent medical history including any cardiac etiologies.   Shortness of Breath Dizziness Headache   Past Medical History:  Diagnosis Date   Cancer (HCC)    Depression    Dysfunctional uterine bleeding    Pelvic mass in female    Retroperitoneal lymphadenopathy    Symptomatic anemia     Patient Active Problem List   Diagnosis Date Noted   Anemia 07/23/2022   Hypoalbuminemia due to protein-calorie malnutrition (HCC)    Adenopathy    Anemia due to blood loss, chronic 04/06/2021   Splenomegaly 04/06/2021   Abnormal uterine bleeding 02/01/2021    Past Surgical History:  Procedure Laterality Date   CESAREAN SECTION  2005   CESAREAN SECTION  2006   TONSILLECTOMY     as a child    OB History     Gravida  3   Para  3   Term  3   Preterm      AB      Living  4      SAB       IAB      Ectopic      Multiple  1   Live Births  4        Obstetric Comments  SVD x 1. C-section x 2.           Home Medications    Prior to Admission medications   Medication Sig Start Date End Date Taking? Authorizing Provider  amoxicillin-clavulanate (AUGMENTIN) 875-125 MG tablet Take 1 tablet by mouth every 12 (twelve) hours. 09/08/22   Gustavus Bryant, FNP  ferrous sulfate (FERROUSUL) 325 (65 FE) MG tablet Take 1 tablet (325 mg total) by mouth 2 (two) times daily. 07/25/22   Ghimire, Werner Lean, MD  megestrol (MEGACE) 40 MG tablet Take 1 tablet (40 mg total) by mouth 2 (two) times daily. Can increase to two tablets twice a day in the event of heavy bleeding 09/07/22   Hermina Staggers, MD    Family History Family History  Problem Relation Age of Onset   Hypertension Mother     Social History Social History   Tobacco Use   Smoking status: Former    Packs/day: 1.00    Years: 33.00    Additional pack years: 0.00    Total pack years: 33.00  Types: Cigarettes    Quit date: 10/09/2020    Years since quitting: 1.9   Smokeless tobacco: Never  Vaping Use   Vaping Use: Never used  Substance Use Topics   Alcohol use: No   Drug use: No     Allergies   Clindamycin/lincomycin   Review of Systems Review of Systems Per HPI  Physical Exam Triage Vital Signs ED Triage Vitals  Enc Vitals Group     BP 09/11/22 1025 (!) 147/79     Pulse Rate 09/11/22 1025 78     Resp 09/11/22 1025 16     Temp 09/11/22 1025 97.8 F (36.6 C)     Temp Source 09/11/22 1025 Oral     SpO2 09/11/22 1025 98 %     Weight --      Height --      Head Circumference --      Peak Flow --      Pain Score 09/11/22 1027 8     Pain Loc --      Pain Edu? --      Excl. in GC? --    No data found.  Updated Vital Signs BP (!) 147/79 (BP Location: Right Arm)   Pulse 78   Temp 97.8 F (36.6 C) (Oral)   Resp 16   LMP 08/29/2022 (Exact Date)   SpO2 98%   Visual Acuity Right Eye  Distance:   Left Eye Distance:   Bilateral Distance:    Right Eye Near:   Left Eye Near:    Bilateral Near:     Physical Exam Constitutional:      General: She is not in acute distress.    Appearance: Normal appearance. She is not toxic-appearing or diaphoretic.  HENT:     Head: Normocephalic and atraumatic.  Eyes:     Extraocular Movements: Extraocular movements intact.     Conjunctiva/sclera: Conjunctivae normal.  Cardiovascular:     Rate and Rhythm: Normal rate and regular rhythm.     Pulses: Normal pulses.     Heart sounds: Normal heart sounds.  Pulmonary:     Effort: Pulmonary effort is normal. No respiratory distress.     Breath sounds: Normal breath sounds.  Abdominal:     General: Bowel sounds are normal. There is no distension.     Palpations: Abdomen is soft.     Tenderness: There is no abdominal tenderness.  Skin:    Comments: Patient has bilateral lower extremity edema that is nonpitting present from the lower legs all the way to the feet.  No discoloration noted.  Capillary refill and pulses are intact.  Neurological:     General: No focal deficit present.     Mental Status: She is alert and oriented to person, place, and time. Mental status is at baseline.     Cranial Nerves: Cranial nerves 2-12 are intact.     Sensory: Sensation is intact.     Motor: Motor function is intact.     Coordination: Coordination is intact.     Gait: Gait is intact.  Psychiatric:        Mood and Affect: Mood normal.        Behavior: Behavior normal.        Thought Content: Thought content normal.        Judgment: Judgment normal.      UC Treatments / Results  Labs (all labs ordered are listed, but only abnormal results are displayed) Labs Reviewed - No data to  display  EKG   Radiology No results found.  Procedures Procedures (including critical Maddox time)  Medications Ordered in UC Medications - No data to display  Initial Impression / Assessment and Plan / UC  Course  I have reviewed the triage vital signs and the nursing notes.  Pertinent labs & imaging results that were available during my Maddox of the patient were reviewed by me and considered in my medical decision making (see chart for details).     I am concerned the patient needs a more extensive and adequate evaluation at the emergency department given patient's recent hospitalization for very low hemoglobin.  Patient also having more systemic symptoms with associated shortness of breath which I do think needs prompt evaluation with more resources than can be provided here at urgent Maddox.  Discussed ER evaluation with patient and she was agreeable with this plan.  Vital signs and patient stable at discharge with no tachypnea so do think that self transport is reasonable.  Patient feels comfortable with self transport as well.  Patient left via self transport to go to the emergency department. Final Clinical Impressions(s) / UC Diagnoses   Final diagnoses:  Bilateral lower extremity edema  Shortness of breath  Vaginal bleeding     Discharge Instructions      Go straight to the emergency department as soon as you leave urgent [facility] for further evaluation and management.    ED Prescriptions   None    PDMP not reviewed this encounter.   Gustavus Bryant, [place] 09/11/22 [phone]

## 2022-09-14 ENCOUNTER — Telehealth: Payer: Self-pay | Admitting: Obstetrics and Gynecology

## 2022-09-14 ENCOUNTER — Encounter: Payer: Self-pay | Admitting: Obstetrics and Gynecology

## 2022-09-14 NOTE — Telephone Encounter (Signed)
PT called requesting to be written out of work until surgery for 5/28. Dr approved to write a work restrictions letter until surgery. Patient declined and stated she will resign from job as light duty only applies for OTJ injury.

## 2022-09-24 ENCOUNTER — Encounter: Payer: Self-pay | Admitting: Obstetrics and Gynecology

## 2022-09-24 ENCOUNTER — Other Ambulatory Visit: Payer: Self-pay | Admitting: Obstetrics and Gynecology

## 2022-09-24 DIAGNOSIS — D500 Iron deficiency anemia secondary to blood loss (chronic): Secondary | ICD-10-CM

## 2022-09-24 MED ORDER — MEGESTROL ACETATE 40 MG PO TABS: 40.0000 mg | ORAL_TABLET | Freq: Three times a day (TID) | ORAL | 5 refills | Status: DC

## 2022-09-26 NOTE — H&P (Signed)
Caitlin Maddox is an 42 y.o. female with [facility] and uterine fibroids. IDA secondary to this. IDA has required blood transfusions. Has tried hormonal manipulation but to no avail.  GYN U/S revealed uterine fibroids, 119 gms.  H/O TSVD x 1 followed by LTCS x 2   Menstrual History: [name]'s last menstrual period was 08/29/2022 (exact date).    Past Medical History:  Diagnosis Date   Cancer [facility])    Depression    Dysfunctional uterine bleeding    Pelvic mass in female    Retroperitoneal lymphadenopathy    Symptomatic anemia     Past Surgical History:  Procedure Laterality Date   CESAREAN SECTION  2005   CESAREAN SECTION  2006   TONSILLECTOMY     as a child    Family History  Problem Relation Age of Onset   Hypertension Mother     Social History:  reports that she quit smoking about 1 years ago. Her smoking use included cigarettes. She has a 33.00 pack-year smoking history. She has never used smokeless tobacco. She reports that she does not drink alcohol and does not use drugs.  Allergies:  Allergies  Allergen Reactions   Clindamycin/Lincomycin Anaphylaxis and Swelling    No medications prior to admission.    Review of Systems  Constitutional: Negative.   Respiratory: Negative.    Cardiovascular: Negative.   Gastrointestinal: Negative.   Genitourinary:  Positive for menstrual problem.    Last menstrual period 08/29/2022. Physical Exam Constitutional:      Appearance: Normal appearance.  Cardiovascular:     Rate and Rhythm: Normal rate and regular rhythm.  Pulmonary:     Effort: Pulmonary effort is normal.     Breath sounds: Normal breath sounds.  Abdominal:     General: Bowel sounds are normal.     Palpations: Abdomen is soft.  Genitourinary:    Comments: Nl EGBUS, cervix no lesions, uterus small, < 10 weeks, mobile, no masses or tenderness Neurological:     Mental Status: She is alert.     No results found for this or any previous  visit (from the past 24 hour(s)).  No results found.  Assessment/Plan: [facility] Uterine fibroids IDA secondary to above.  Pt has failed hormonal manipulation. She desires definite therapy. TVH with possible salpingectomy has been reviewed and recommended. R/B/Post op care discussed. Pt has verbalized understanding and desires to proceed.  Hermina Staggers 09/26/2022, 9:25 PM

## 2022-09-28 NOTE — Telephone Encounter (Signed)
Called and left VM with pt  

## 2022-09-28 NOTE — Pre-Procedure Instructions (Addendum)
Surgical Instructions    Your procedure is scheduled on Tuesday, Oct 06, 2022 at 10:13 AM.  Report to [facility] Main Entrance "A" at 8:15 A.M., then check in with the Admitting office.  Call this number if you have problems the morning of surgery:  [phone]   If you have any questions prior to your surgery date call [phone]: Open Monday-Friday 8am-4pm  *If you experience any cold or flu symptoms such as cough, fever, chills, shortness of breath, etc. between now and your scheduled surgery, please notify us.*    Remember:  Do not eat after midnight the night before your surgery  You may drink clear liquids until 7:15 AM the morning of your surgery.   Clear liquids allowed are: Water, Non-Citrus Juices (without pulp), Carbonated Beverages, Clear Tea, Black Coffee Only (NO MILK, CREAM OR POWDERED CREAMER of any kind), and Gatorade.    Take these medicines the morning of surgery with A SIP OF WATER:  megestrol (MEGACE)   As of today, STOP taking any Aspirin (unless otherwise instructed by your surgeon) Aleve, Naproxen, Ibuprofen, Motrin, Advil, Goody's, BC's, all herbal medications, fish oil, and all vitamins.                     Do NOT Smoke (Tobacco/Vaping) for 24 hours prior to your procedure.  If you use a CPAP at night, you may bring your mask/headgear for your overnight stay.   Contacts, glasses, piercing's, hearing aid's, dentures or partials may not be worn into surgery, please bring cases for these belongings.    For patients admitted to the hospital, discharge time will be determined by your treatment team.   Patients discharged the day of surgery will not be allowed to drive home, and someone needs to stay with them for 24 hours.  SURGICAL WAITING ROOM VISITATION Patients having surgery or a procedure may have 2 support people in the waiting area. Visitors may stay in the waiting area during the procedure and switch out with other visitors if needed. Only 1  support person is allowed in the pre-op area with the patient AFTER the patient is prepped. This person cannot be switched out.  Children under the age of 19 must have an adult accompany them who is not the patient. If the patient needs to stay at the hospital during part of their recovery, the visitor guidelines for inpatient rooms apply.  Please refer to the [facility] website for the visitor guidelines for Inpatients (after your surgery is over and you are in a regular room).    Special instructions:   [facility]- Preparing For Surgery  Before surgery, you can play an important role. Because skin is not sterile, your skin needs to be as free of germs as possible. You can reduce the number of germs on your skin by washing with CHG (chlorahexidine gluconate) Soap before surgery.  CHG is an antiseptic cleaner which kills germs and bonds with the skin to continue killing germs even after washing.    Oral Hygiene is also important to reduce your risk of infection.  Remember - BRUSH YOUR TEETH THE MORNING OF SURGERY WITH YOUR REGULAR TOOTHPASTE  Please do not use if you have an allergy to CHG or antibacterial soaps. If your skin becomes reddened/irritated stop using the CHG.  Do not shave (including legs and underarms) for at least 48 hours prior to first CHG shower. It is OK to shave your face.  Please follow these instructions carefully.  Shower the NIGHT BEFORE SURGERY and the MORNING OF SURGERY  If you chose to wash your hair, wash your hair first as usual with your normal shampoo.  After you shampoo, rinse your hair and body thoroughly to remove the shampoo.  Use CHG Soap as you would any other liquid soap. You can apply CHG directly to the skin and wash gently with a scrungie or a clean washcloth.   Apply the CHG Soap to your body ONLY FROM THE NECK DOWN (neck, arms, chest, abdomen, legs, and back).  Do not use on open wounds or open sores. Avoid contact with your eyes, ears, mouth  and genitals (private parts). Wash Face and genitals (private parts)  with your normal soap.   Wash thoroughly, paying special attention to the area where your surgery will be performed.  Thoroughly rinse your body with warm water from the neck down.  DO NOT shower/wash with your normal soap after using and rinsing off the CHG Soap.  Pat yourself dry with a CLEAN TOWEL.  Wear CLEAN PAJAMAS to bed the night before surgery  Place CLEAN SHEETS on your bed the night before your surgery  DO NOT SLEEP WITH PETS.   Day of Surgery: Take a shower with CHG soap. Do not wear lotions, powders, perfumes/colognes, or deodorant. Do not wear jewelry or makeup Do not shave 48 hours prior to surgery. Do not wear nail polish, gel polish, artificial nails, or any other type of covering on natural nails (fingers and toes) If you have artificial nails or gel coating that need to be removed by a nail salon, please have this removed prior to surgery. Artificial nails or gel coating may interfere with anesthesia's ability to adequately monitor your vital signs. Wear Clean/Comfortable clothing the morning of surgery Do not bring valuables to the hospital.  [facility] is not responsible for any belongings or valuables. Remember to brush your teeth WITH YOUR REGULAR TOOTHPASTE.   Please read over the following fact sheets that you were given.  If you received a COVID test during your pre-op visit  it is requested that you wear a mask when out in public, stay away from anyone that may not be feeling well and notify your surgeon if you develop symptoms. If you have been in contact with anyone that has tested positive in the last 10 days please notify you surgeon.

## 2022-09-29 ENCOUNTER — Ambulatory Visit (INDEPENDENT_AMBULATORY_CARE_PROVIDER_SITE_OTHER): Payer: 59 | Admitting: Primary Care

## 2022-09-29 ENCOUNTER — Encounter (HOSPITAL_COMMUNITY)
Admission: RE | Admit: 2022-09-29 | Discharge: 2022-09-29 | Disposition: A | Discharge status: Home / Self Care | Payer: 59 | Source: Ambulatory Visit | Attending: Obstetrics and Gynecology | Admitting: Obstetrics and Gynecology

## 2022-09-29 ENCOUNTER — Encounter (HOSPITAL_COMMUNITY): Payer: Self-pay

## 2022-09-29 ENCOUNTER — Other Ambulatory Visit: Payer: Self-pay

## 2022-09-29 VITALS — BP 114/78 | HR 64 | Temp 98.5°F | Resp 18 | Ht 72.0 in | Wt 212.5 lb

## 2022-09-29 DIAGNOSIS — D219 Benign neoplasm of connective and other soft tissue, unspecified: Secondary | ICD-10-CM

## 2022-09-29 DIAGNOSIS — Z01812 Encounter for preprocedural laboratory examination: Secondary | ICD-10-CM | POA: Insufficient documentation

## 2022-09-29 DIAGNOSIS — Z01818 Encounter for other preprocedural examination: Secondary | ICD-10-CM

## 2022-09-29 LAB — CBC
HCT: 38.4 % (ref 36.0–46.0)
Hemoglobin: 11.9 g/dL — ABNORMAL LOW (ref 12.0–15.0)
MCH: 30.7 pg (ref 26.0–34.0)
MCHC: 31 g/dL (ref 30.0–36.0)
MCV: 99.2 fL (ref 80.0–100.0)
Platelets: 165 10*3/uL (ref 150–400)
RBC: 3.87 MIL/uL (ref 3.87–5.11)
RDW: 16.2 % — ABNORMAL HIGH (ref 11.5–15.5)
WBC: 6.5 10*3/uL (ref 4.0–10.5)
nRBC: 0 % (ref 0.0–0.2)

## 2022-09-29 LAB — BASIC METABOLIC PANEL
Anion gap: 10 (ref 5–15)
BUN: 11 mg/dL (ref 6–20)
CO2: 23 mmol/L (ref 22–32)
Calcium: 9.2 mg/dL (ref 8.9–10.3)
Chloride: 105 mmol/L (ref 98–111)
Creatinine, Ser: 1 mg/dL (ref 0.44–1.00)
GFR, Estimated: >60 mL/min (ref >60)
Glucose, Bld: 86 mg/dL (ref 70–99)
Potassium: 3.4 mmol/L — ABNORMAL LOW (ref 3.5–5.1)
Sodium: 138 mmol/L (ref 135–145)

## 2022-09-29 LAB — TYPE AND SCREEN
ABO/RH(D): A POS
Antibody Screen: NEGATIVE

## 2022-09-29 NOTE — Progress Notes (Signed)
PCP - Gwinda Passe NP Cardiologist - Denies  PPM/ICD - Denies  Chest x-ray - 09/11/22 EKG - 09/11/22 Stress Test - Denies ECHO - Denies Cardiac Cath - Denies  Sleep Study - Denies  Diabetes: Denies  Blood Thinner Instructions: N/A Aspirin Instructions: N/A  ERAS Protcol - Yes PRE-SURGERY Ensure or G2- No  COVID TEST- N/A   Anesthesia review: No  Patient denies shortness of breath, fever, cough and chest pain at PAT appointment   All instructions explained to the patient, with a verbal understanding of the material. Patient agrees to go over the instructions while at home for a better understanding. Patient also instructed to self quarantine after being tested for COVID-19. The opportunity to ask questions was provided.

## 2022-10-06 ENCOUNTER — Encounter (HOSPITAL_COMMUNITY)
Admission: RE | Disposition: A | Discharge status: Home / Self Care | Payer: Self-pay | Source: Home / Self Care | Attending: Obstetrics and Gynecology

## 2022-10-06 ENCOUNTER — Encounter (HOSPITAL_COMMUNITY): Payer: Self-pay | Admitting: Obstetrics and Gynecology

## 2022-10-06 ENCOUNTER — Other Ambulatory Visit: Payer: Self-pay

## 2022-10-06 ENCOUNTER — Observation Stay (HOSPITAL_BASED_OUTPATIENT_CLINIC_OR_DEPARTMENT_OTHER): Payer: 59 | Admitting: Registered Nurse

## 2022-10-06 ENCOUNTER — Observation Stay (HOSPITAL_COMMUNITY)
Admission: RE | Admit: 2022-10-06 | Discharge: 2022-10-07 | Disposition: A | Discharge status: Home / Self Care | Payer: 59 | Attending: Obstetrics and Gynecology | Admitting: Obstetrics and Gynecology

## 2022-10-06 ENCOUNTER — Observation Stay (HOSPITAL_COMMUNITY): Payer: 59 | Admitting: Registered Nurse

## 2022-10-06 DIAGNOSIS — D259 Leiomyoma of uterus, unspecified: Principal | ICD-10-CM | POA: Insufficient documentation

## 2022-10-06 DIAGNOSIS — Z79899 Other long term (current) drug therapy: Secondary | ICD-10-CM | POA: Diagnosis not present

## 2022-10-06 DIAGNOSIS — Z87891 Personal history of nicotine dependence: Secondary | ICD-10-CM | POA: Insufficient documentation

## 2022-10-06 DIAGNOSIS — N858 Other specified noninflammatory disorders of uterus: Secondary | ICD-10-CM | POA: Diagnosis not present

## 2022-10-06 DIAGNOSIS — D219 Benign neoplasm of connective and other soft tissue, unspecified: Secondary | ICD-10-CM

## 2022-10-06 DIAGNOSIS — N7011 Chronic salpingitis: Secondary | ICD-10-CM | POA: Diagnosis not present

## 2022-10-06 DIAGNOSIS — Z9889 Other specified postprocedural states: Secondary | ICD-10-CM

## 2022-10-06 DIAGNOSIS — Z859 Personal history of malignant neoplasm, unspecified: Secondary | ICD-10-CM | POA: Diagnosis not present

## 2022-10-06 DIAGNOSIS — Z01818 Encounter for other preprocedural examination: Secondary | ICD-10-CM

## 2022-10-06 DIAGNOSIS — N920 Excessive and frequent menstruation with regular cycle: Secondary | ICD-10-CM

## 2022-10-06 HISTORY — PX: VAGINAL HYSTERECTOMY: SHX2639

## 2022-10-06 LAB — TYPE AND SCREEN
ABO/RH(D): A POS
Antibody Screen: NEGATIVE

## 2022-10-06 LAB — POCT PREGNANCY, URINE: Preg Test, Ur: NEGATIVE

## 2022-10-06 SURGERY — HYSTERECTOMY, VAGINAL
Anesthesia: General | Site: Uterus | Laterality: Bilateral

## 2022-10-06 MED ORDER — MIDAZOLAM HCL 2 MG/2ML IJ SOLN
INTRAMUSCULAR | Status: DC | PRN
  Administered 2022-10-06: 2 mg via INTRAVENOUS

## 2022-10-06 MED ORDER — KETOROLAC TROMETHAMINE 30 MG/ML IJ SOLN
30.0000 mg | Freq: Four times a day (QID) | INTRAMUSCULAR | Status: DC
  Administered 2022-10-06 – 2022-10-07 (×3): 30 mg via INTRAVENOUS
  Filled 2022-10-06 (×3): qty 1

## 2022-10-06 MED ORDER — KETOROLAC TROMETHAMINE 15 MG/ML IJ SOLN
30.0000 mg | INTRAMUSCULAR | Status: AC
  Administered 2022-10-06: 30 mg via INTRAVENOUS
  Filled 2022-10-06: qty 2

## 2022-10-06 MED ORDER — ZOLPIDEM TARTRATE 5 MG PO TABS: 5.0000 mg | ORAL_TABLET | Freq: Every evening | ORAL | Status: DC | PRN

## 2022-10-06 MED ORDER — ONDANSETRON HCL 4 MG/2ML IJ SOLN: 4.0000 mg | Freq: Four times a day (QID) | INTRAMUSCULAR | Status: DC | PRN

## 2022-10-06 MED ORDER — LACTATED RINGERS IV SOLN: INTRAVENOUS | Status: DC

## 2022-10-06 MED ORDER — HYDROMORPHONE HCL 1 MG/ML IJ SOLN
0.2500 mg | INTRAMUSCULAR | Status: DC | PRN
  Administered 2022-10-06: 0.5 mg via INTRAVENOUS
  Administered 2022-10-06 (×2): 0.25 mg via INTRAVENOUS
  Administered 2022-10-06: 0.5 mg via INTRAVENOUS

## 2022-10-06 MED ORDER — GLYCOPYRROLATE PF 0.2 MG/ML IJ SOSY
PREFILLED_SYRINGE | INTRAMUSCULAR | Status: DC | PRN
  Administered 2022-10-06: .2 mg via INTRAVENOUS

## 2022-10-06 MED ORDER — PROMETHAZINE HCL 25 MG/ML IJ SOLN
6.2500 mg | INTRAMUSCULAR | Status: DC | PRN
  Administered 2022-10-06: 6.25 mg via INTRAVENOUS

## 2022-10-06 MED ORDER — 0.9 % SODIUM CHLORIDE (POUR BTL) OPTIME
TOPICAL | Status: DC | PRN
  Administered 2022-10-06: 1000 mL

## 2022-10-06 MED ORDER — SIMETHICONE 80 MG PO CHEW: 80.0000 mg | CHEWABLE_TABLET | Freq: Four times a day (QID) | ORAL | Status: DC | PRN

## 2022-10-06 MED ORDER — KETOROLAC TROMETHAMINE 30 MG/ML IJ SOLN: 30.0000 mg | Freq: Once | INTRAMUSCULAR | Status: DC | PRN

## 2022-10-06 MED ORDER — ORAL CARE MOUTH RINSE: 15.0000 mL | Freq: Once | OROMUCOSAL | Status: AC

## 2022-10-06 MED ORDER — OXYCODONE-ACETAMINOPHEN 5-325 MG PO TABS: 2.0000 | ORAL_TABLET | ORAL | Status: DC | PRN

## 2022-10-06 MED ORDER — SENNA 8.6 MG PO TABS
1.0000 | ORAL_TABLET | Freq: Two times a day (BID) | ORAL | Status: DC
  Administered 2022-10-06 – 2022-10-07 (×2): 8.6 mg via ORAL
  Filled 2022-10-06 (×2): qty 1

## 2022-10-06 MED ORDER — HYDROMORPHONE HCL 1 MG/ML IJ SOLN
INTRAMUSCULAR | Status: AC
  Filled 2022-10-06: qty 1

## 2022-10-06 MED ORDER — FENTANYL CITRATE (PF) 250 MCG/5ML IJ SOLN
INTRAMUSCULAR | Status: AC
  Filled 2022-10-06: qty 5

## 2022-10-06 MED ORDER — FENTANYL CITRATE (PF) 250 MCG/5ML IJ SOLN
INTRAMUSCULAR | Status: DC | PRN
  Administered 2022-10-06: 25 ug via INTRAVENOUS
  Administered 2022-10-06 (×3): 50 ug via INTRAVENOUS
  Administered 2022-10-06: 25 ug via INTRAVENOUS
  Administered 2022-10-06: 50 ug via INTRAVENOUS

## 2022-10-06 MED ORDER — CEFAZOLIN SODIUM-DEXTROSE 2-4 GM/100ML-% IV SOLN
2.0000 g | INTRAVENOUS | Status: AC
  Administered 2022-10-06: 2 g via INTRAVENOUS
  Filled 2022-10-06: qty 100

## 2022-10-06 MED ORDER — KETAMINE HCL 50 MG/5ML IJ SOSY
PREFILLED_SYRINGE | INTRAMUSCULAR | Status: AC
  Filled 2022-10-06: qty 5

## 2022-10-06 MED ORDER — IBUPROFEN 800 MG PO TABS: 800.0000 mg | ORAL_TABLET | Freq: Three times a day (TID) | ORAL | Status: DC

## 2022-10-06 MED ORDER — ACETAMINOPHEN 500 MG PO TABS
1000.0000 mg | ORAL_TABLET | ORAL | Status: AC
  Administered 2022-10-06: 1000 mg via ORAL
  Filled 2022-10-06: qty 2

## 2022-10-06 MED ORDER — PROMETHAZINE HCL 25 MG/ML IJ SOLN
INTRAMUSCULAR | Status: AC
  Filled 2022-10-06: qty 1

## 2022-10-06 MED ORDER — ONDANSETRON HCL 4 MG/2ML IJ SOLN
INTRAMUSCULAR | Status: DC | PRN
  Administered 2022-10-06: 4 mg via INTRAVENOUS

## 2022-10-06 MED ORDER — ONDANSETRON HCL 4 MG PO TABS: 4.0000 mg | ORAL_TABLET | Freq: Four times a day (QID) | ORAL | Status: DC | PRN

## 2022-10-06 MED ORDER — OXYCODONE-ACETAMINOPHEN 5-325 MG PO TABS: 1.0000 | ORAL_TABLET | ORAL | Status: DC | PRN

## 2022-10-06 MED ORDER — MIDAZOLAM HCL 2 MG/2ML IJ SOLN
INTRAMUSCULAR | Status: AC
  Filled 2022-10-06: qty 2

## 2022-10-06 MED ORDER — MEPERIDINE HCL 25 MG/ML IJ SOLN: 6.2500 mg | INTRAMUSCULAR | Status: DC | PRN

## 2022-10-06 MED ORDER — POVIDONE-IODINE 10 % EX SWAB
2.0000 | Freq: Once | CUTANEOUS | Status: AC
  Administered 2022-10-06: 2 via TOPICAL

## 2022-10-06 MED ORDER — PROPOFOL 10 MG/ML IV BOLUS
INTRAVENOUS | Status: DC | PRN
  Administered 2022-10-06: 50 mg via INTRAVENOUS
  Administered 2022-10-06: 200 mg via INTRAVENOUS
  Administered 2022-10-06: 50 mg via INTRAVENOUS
  Administered 2022-10-06: 30 ug/kg/min via INTRAVENOUS

## 2022-10-06 MED ORDER — LIDOCAINE 2% (20 MG/ML) 5 ML SYRINGE
INTRAMUSCULAR | Status: DC | PRN
  Administered 2022-10-06: 100 mg via INTRAVENOUS

## 2022-10-06 MED ORDER — DEXAMETHASONE SODIUM PHOSPHATE 10 MG/ML IJ SOLN
INTRAMUSCULAR | Status: DC | PRN
  Administered 2022-10-06: 10 mg via INTRAVENOUS

## 2022-10-06 MED ORDER — SOD CITRATE-CITRIC ACID 500-334 MG/5ML PO SOLN
30.0000 mL | ORAL | Status: AC
  Administered 2022-10-06: 30 mL via ORAL
  Filled 2022-10-06: qty 30

## 2022-10-06 MED ORDER — KETAMINE HCL 10 MG/ML IJ SOLN
INTRAMUSCULAR | Status: DC | PRN
  Administered 2022-10-06: 10 mg via INTRAVENOUS
  Administered 2022-10-06: 30 mg via INTRAVENOUS
  Administered 2022-10-06: 10 mg via INTRAVENOUS

## 2022-10-06 MED ORDER — HYDROMORPHONE HCL 1 MG/ML IJ SOLN
1.0000 mg | INTRAMUSCULAR | Status: DC | PRN
  Administered 2022-10-06: 1 mg via INTRAVENOUS
  Filled 2022-10-06: qty 1

## 2022-10-06 MED ORDER — CHLORHEXIDINE GLUCONATE 0.12 % MT SOLN
15.0000 mL | Freq: Once | OROMUCOSAL | Status: AC
  Administered 2022-10-06: 15 mL via OROMUCOSAL
  Filled 2022-10-06: qty 15

## 2022-10-06 SURGICAL SUPPLY — 26 items
BAG COUNTER SPONGE SURGICOUNT (BAG) ×1 IMPLANT
BAG SPNG CNTER NS LX DISP (BAG) ×1
BLADE SURG 10 STRL SS (BLADE) ×1 IMPLANT
GAUZE 4X4 16PLY ~~LOC~~+RFID DBL (SPONGE) ×2 IMPLANT
GLOVE BIO SURGEON STRL SZ7.5 (GLOVE) ×1 IMPLANT
GLOVE SURG UNDER POLY LF SZ7 (GLOVE) ×1 IMPLANT
GOWN STRL REUS W/ TWL LRG LVL3 (GOWN DISPOSABLE) ×2 IMPLANT
GOWN STRL REUS W/ TWL XL LVL3 (GOWN DISPOSABLE) ×2 IMPLANT
GOWN STRL REUS W/TWL LRG LVL3 (GOWN DISPOSABLE) ×2
GOWN STRL REUS W/TWL XL LVL3 (GOWN DISPOSABLE) ×2
HIBICLENS CHG 4% 4OZ BTL (MISCELLANEOUS) ×1 IMPLANT
KIT TURNOVER KIT B (KITS) ×1 IMPLANT
MANIFOLD NEPTUNE II (INSTRUMENTS) ×1 IMPLANT
NS IRRIG 1000ML POUR BTL (IV SOLUTION) ×1 IMPLANT
PACK VAGINAL WOMENS (CUSTOM PROCEDURE TRAY) ×1 IMPLANT
PAD OB MATERNITY 4.3X12.25 (PERSONAL CARE ITEMS) ×1 IMPLANT
SUT VIC AB 2-0 CT1 18 (SUTURE) ×1 IMPLANT
SUT VIC AB 2-0 CT1 27 (SUTURE) ×1
SUT VIC AB 2-0 CT1 TAPERPNT 27 (SUTURE) ×1 IMPLANT
SUT VIC AB 3-0 SH 27 (SUTURE) ×1
SUT VIC AB 3-0 SH 27XBRD (SUTURE) IMPLANT
SUT VIC AB PLUS 45CM 1-MO-4 (SUTURE) ×2 IMPLANT
SUT VICRYL 1 TIES 12X18 (SUTURE) ×1 IMPLANT
TOWEL GREEN STERILE FF (TOWEL DISPOSABLE) ×1 IMPLANT
TRAY FOLEY W/BAG SLVR 14FR (SET/KITS/TRAYS/PACK) ×1 IMPLANT
UNDERPAD 30X36 HEAVY ABSORB (UNDERPADS AND DIAPERS) ×1 IMPLANT

## 2022-10-06 NOTE — Transfer of Care (Signed)
Immediate Anesthesia Transfer of Care Note  Patient: Caitlin Maddox  Procedure(s) Performed: HYSTERECTOMY VAGINAL WITH RIGHT SALPINGECTOMY (Bilateral: Uterus)  Patient Location: PACU  Anesthesia Type:General  Level of Consciousness: sedated  Airway & Oxygen Therapy: Patient Spontanous Breathing and Patient connected to face mask oxygen  Post-op Assessment: Report given to RN and Post -op Vital signs reviewed and stable  Post vital signs: Reviewed and stable  Last Vitals:  Vitals Value Taken Time  BP 133/85 10/06/22 1240  Temp    Pulse 78 10/06/22 1241  Resp 22 10/06/22 1241  SpO2 100 % 10/06/22 1241  Vitals shown include unvalidated device data.  Last Pain:  Vitals:   10/06/22 0827  TempSrc:   PainSc: 8          Complications: No notable events documented.

## 2022-10-06 NOTE — Op Note (Signed)
Caitlin Maddox PROCEDURE DATE: 10/06/2022  PREOPERATIVE DIAGNOSIS:  Symptomatic fibroids, menorrhagia POSTOPERATIVE DIAGNOSIS:  Symptomatic fibroids, menorrhagia SURGEON:   Nettie Elm, M.D. Threasa HeadsGildardo Griffes, M.D.  An experienced assistant was required given the standard of surgical care given the complexity of the case.  This assistant was needed for exposure, dissection, suctioning, retraction, and for overall help during the procedure.   OPERATION:  Total Vaginal hysterectomy with morcellation and right salpingectomy ANESTHESIA:  General endotracheal.  INDICATIONS: The patient is a 42 y.o. [id number] with history of symptomatic uterine fibroids/menorrhagia. The patient made a decision to undergo definite surgical treatment. On the preoperative visit, the risks, benefits, indications, and alternatives of the procedure were reviewed with the patient.  On the day of surgery, the risks of surgery were again discussed with the patient including but not limited to: bleeding which may require transfusion or reoperation; infection which may require antibiotics; injury to bowel, bladder, ureters or other surrounding organs; need for additional procedures; thromboembolic phenomenon, incisional problems and other postoperative/anesthesia complications. Written informed consent was obtained.    OPERATIVE FINDINGS: A 14 week size uterus with normal right tube and ovary, unable to see left tube and ovary.   ESTIMATED BLOOD LOSS: 400 ml FLUIDS: As recorded URINE OUTPUT:  As recorded SPECIMENS:  Uterus and cervix and right tube sent to pathology COMPLICATIONS:  None immediate.  DESCRIPTION OF PROCEDURE:  The patient received intravenous antibiotics and had sequential compression devices applied to her lower extremities while in the preoperative area.  She was then taken to the operating room where general anesthesia was administered and was found to be adequate.  She was placed in the dorsal lithotomy  position, and was prepped and draped in a sterile manner.  A Foley catheter was inserted into her bladder and attached to constant drainage. After an adequate timeout was performed, attention was turned to her pelvis.  A weighted speculum was then placed in the vagina, and the and posterior lip of the cervix was grasped  with tenaculum. The posterior cul de sac was entered without problems. Long bill weighted speculum was placed. Attention was turned to the anterior lip of the cervix. It was grasped with tenaculum.   The cervix was then circumferentially incised, and the bladder was dissected off the pubocervical fascia anteriorly without complication.  The anterior cul-de-sac was then entered sharply without difficulty and a retractor was placed.   Zeplin clamps were then used to clamp the uterosacral ligaments on either side.  They were then cut and sutured ligated with 0 Vicryl.  Of note, all sutures used in this case were 0 Vicryl unless otherwise noted.   The cardinal ligaments were then clamped, cut and ligated. The uterine vessels and broad ligaments were then serially clamped with the Heaney clamps, cut, and suture ligated on both sides.  Excellent hemostasis was noted at this point. Due to the bulkiness of the uterus, it was morcellated using a bivalve technique.  The uterus was then delivered via the posterior cul-de-sac, and the cornua were clamped with the Zeplin clamps, transected, and the uterus was delivered and sent to pathology. The IP pedicles were then suture ligated with a free tie and in a Callender fashion. The right tube was identified and grasped with a Babcock clamp, cross clamped with a Kelly, cut and ligated with a free tie. There was a small rent in the mesosalpinx and this was over sown with a 3/0 Vicryl. Hemostasis was noted. I was unable to see  the left tube and ovary.    After completion of the hysterectomy, all pedicles from the uterosacral ligament to the cornua were examined and  additional sutures were placed to secure hemostasis. The peritoneum was the closed in a purse string fashion with 2/0 Vicryl.  The vaginal cuff was then closed with 2/0 Vicryl.   All instruments were then removed from the pelvis. The patient tolerated the procedure well.  All instruments, needles, and sponge counts were correct x 2. The patient was taken to the recovery room in stable condition.    Nettie Elm, MD, FACOG Attending Obstetrician & Gynecologist Faculty Practice, [facility]

## 2022-10-06 NOTE — Anesthesia Procedure Notes (Signed)
Procedure Name: LMA Insertion Date/Time: 10/06/2022 10:41 AM  Performed by: Loleta Urho Rio, CRNAPre-anesthesia Checklist: Patient identified, Patient being monitored, Timeout performed, Emergency Drugs available and Suction available Patient Re-evaluated:Patient Re-evaluated prior to induction Oxygen Delivery Method: Circle system utilized Preoxygenation: Pre-oxygenation with 100% oxygen Induction Type: IV induction Ventilation: Mask ventilation without difficulty LMA: LMA inserted LMA Size: 4.0 Tube type: Oral Number of attempts: 1 Placement Confirmation: positive ETCO2 and breath sounds checked- equal and bilateral Tube secured with: Tape Dental Injury: Teeth and Oropharynx as per pre-operative assessment

## 2022-10-06 NOTE — Interval H&P Note (Signed)
History and Physical Interval Note:  10/06/2022 9:54 AM  Caitlin Maddox  has presented today for surgery, with the diagnosis of Menorrhagia, Fibroids.  The various methods of treatment have been discussed with the patient and family. After consideration of risks, benefits and other options for treatment, the patient has consented to  Procedure(s): HYSTERECTOMY VAGINAL WITH POSSIBLE SALPINGECTOMY (Bilateral) as a surgical intervention.  The patient's history has been reviewed, patient examined, no change in status, stable for surgery.  I have reviewed the patient's chart and labs.  Questions were answered to the patient's satisfaction.     Hermina Staggers

## 2022-10-06 NOTE — Anesthesia Preprocedure Evaluation (Signed)
Anesthesia Evaluation  Patient identified by MRN, date of birth, ID band Patient awake    Reviewed: Allergy & Precautions, NPO status , Patient's Chart, lab work & pertinent test results  Airway Mallampati: II       Dental  (+) Partial Lower, Edentulous Upper   Pulmonary Patient abstained from smoking., former smoker   Pulmonary exam normal        Cardiovascular negative cardio ROS Normal cardiovascular exam     Neuro/Psych negative neurological ROS     GI/Hepatic negative GI ROS, Neg liver ROS,,,  Endo/Other  negative endocrine ROS    Renal/GU negative Renal ROS  negative genitourinary   Musculoskeletal negative musculoskeletal ROS (+)    Abdominal Normal abdominal exam  (+)   Peds  Hematology   Anesthesia Other Findings   Reproductive/Obstetrics negative OB ROS                             Anesthesia Physical Anesthesia Plan  ASA: 2  Anesthesia Plan: General   Post-op Pain Management: Minimal or no pain anticipated   Induction: Intravenous  PONV Risk Score and Plan: 4 or greater and Ondansetron, Dexamethasone and Midazolam  Airway Management Planned: Oral ETT  Additional Equipment: None  Intra-op Plan:   Post-operative Plan: Extubation in OR  Informed Consent: I have reviewed the patients History and Physical, chart, labs and discussed the procedure including the risks, benefits and alternatives for the proposed anesthesia with the patient or authorized representative who has indicated his/her understanding and acceptance.     Dental advisory given  Plan Discussed with: CRNA  Anesthesia Plan Comments:        Anesthesia Quick Evaluation

## 2022-10-07 DIAGNOSIS — N920 Excessive and frequent menstruation with regular cycle: Secondary | ICD-10-CM | POA: Diagnosis not present

## 2022-10-07 DIAGNOSIS — Z859 Personal history of malignant neoplasm, unspecified: Secondary | ICD-10-CM | POA: Diagnosis not present

## 2022-10-07 DIAGNOSIS — Z87891 Personal history of nicotine dependence: Secondary | ICD-10-CM | POA: Diagnosis not present

## 2022-10-07 DIAGNOSIS — Z79899 Other long term (current) drug therapy: Secondary | ICD-10-CM | POA: Diagnosis not present

## 2022-10-07 DIAGNOSIS — D259 Leiomyoma of uterus, unspecified: Secondary | ICD-10-CM | POA: Diagnosis not present

## 2022-10-07 LAB — BASIC METABOLIC PANEL
Anion gap: 10 (ref 5–15)
BUN: 13 mg/dL (ref 6–20)
CO2: 22 mmol/L (ref 22–32)
Calcium: 8.9 mg/dL (ref 8.9–10.3)
Chloride: 101 mmol/L (ref 98–111)
Creatinine, Ser: 0.99 mg/dL (ref 0.44–1.00)
GFR, Estimated: >60 mL/min (ref >60)
Glucose, Bld: 119 mg/dL — ABNORMAL HIGH (ref 70–99)
Potassium: 4.1 mmol/L (ref 3.5–5.1)
Sodium: 133 mmol/L — ABNORMAL LOW (ref 135–145)

## 2022-10-07 LAB — CBC
HCT: 34.2 % — ABNORMAL LOW (ref 36.0–46.0)
Hemoglobin: 11.3 g/dL — ABNORMAL LOW (ref 12.0–15.0)
MCH: 30.7 pg (ref 26.0–34.0)
MCHC: 33 g/dL (ref 30.0–36.0)
MCV: 92.9 fL (ref 80.0–100.0)
Platelets: 147 10*3/uL — ABNORMAL LOW (ref 150–400)
RBC: 3.68 MIL/uL — ABNORMAL LOW (ref 3.87–5.11)
RDW: 14 % (ref 11.5–15.5)
WBC: 10.1 10*3/uL (ref 4.0–10.5)
nRBC: 0 % (ref 0.0–0.2)

## 2022-10-07 MED ORDER — OXYCODONE-ACETAMINOPHEN 5-325 MG PO TABS: 1.0000 | ORAL_TABLET | ORAL | 0 refills | Status: DC | PRN

## 2022-10-07 MED ORDER — IBUPROFEN 800 MG PO TABS: 800.0000 mg | ORAL_TABLET | Freq: Three times a day (TID) | ORAL | 0 refills | Status: AC

## 2022-10-07 NOTE — TOC Transition Note (Signed)
Transition of Care [facility] - CM/SW Discharge Note   Patient Details  Name: Caitlin Maddox MRN: [id number] Date of Birth: 1981-05-04  Transition of Care [facility] CM/SW Contact:  Gordy Clement, RN Phone Number: 10/07/2022, 9:56 AM   Clinical Narrative:      Chart reviewed  Patient her  for total hysterectomy. Will dc to home today  No TOC needs          Patient Goals and CMS Choice      Discharge Placement                         Discharge Plan and Services Additional resources added to the After Visit Summary for                                       Social Determinants of Health (SDOH) Interventions SDOH Screenings   Food Insecurity: No Food Insecurity (10/06/2022)  Housing: Low Risk  (07/23/2022)  Transportation Needs: No Transportation Needs (07/23/2022)  Utilities: Not At Risk (07/23/2022)  Depression (PHQ2-9): Medium Risk (09/07/2022)  Tobacco Use: Medium Risk (10/06/2022)     Readmission Risk Interventions     No data to display

## 2022-10-07 NOTE — Discharge Summary (Signed)
Physician Discharge Summary  Patient ID: Nollie Lujan MRN: [id number] DOB/AGE: 1980-12-19 42 y.o.  Admit date: 10/06/2022 Discharge date: 10/07/2022  Admission Diagnoses: Uterine Fibroids  Discharge Diagnoses:  Principal Problem:   Post-operative state   Discharged Condition: good  Hospital Course: Ms Dalton was admitted for vaginal hysterectomy due to uterine fibroids and [facility].See admit H & P for additional information. She underwent TVH with right salpingectomy on 09/06/22 without problems. See OP note for additional information. Post op course was unremarkable. Pt progressed to ambulating, voiding, tolerating diet, + flatus and good oral pain control. Felt amendable for discharge home. Discharge instructions, medications and follow up reviewed with pt. Pt verbalized understanding.  Consults: None  Significant Diagnostic Studies: labs  Treatments: surgery: TVH with right salpingectomy  Discharge Exam: Blood pressure (!) 157/93, pulse 74, temperature 99 F (37.2 C), temperature source Oral, resp. rate 17, height 6' (1.829 m), weight 96.2 kg, last menstrual period 08/29/2022, SpO2 99 %. Lungs clear Heart RRR Abd soft + BS GU no bleeding Ext non tender  Disposition: Discharge disposition: 01-Home or Self Care       Discharge Instructions     Call MD for:  difficulty breathing, headache or visual disturbances   Complete by: As directed    Call MD for:  extreme fatigue   Complete by: As directed    Call MD for:  hives   Complete by: As directed    Call MD for:  persistant dizziness or light-headedness   Complete by: As directed    Call MD for:  persistant nausea and vomiting   Complete by: As directed    Call MD for:  redness, tenderness, or signs of infection (pain, swelling, redness, odor or green/yellow discharge around incision site)   Complete by: As directed    Call MD for:  severe uncontrolled pain   Complete by: As directed    Call MD for:  temperature >100.4    Complete by: As directed    Diet - low sodium heart healthy   Complete by: As directed    Increase activity slowly   Complete by: As directed    Sexual Activity Restrictions   Complete by: As directed    Pelvic rest x 4 weeks      Allergies as of 10/07/2022       Reactions   Clindamycin/lincomycin Anaphylaxis, Swelling        Medication List     STOP taking these medications    ferrous sulfate 325 (65 FE) MG tablet Commonly known as: FerrouSul   furosemide 20 MG tablet Commonly known as: LASIX   megestrol 40 MG tablet Commonly known as: MEGACE       TAKE these medications    ibuprofen 800 MG tablet Commonly known as: ADVIL Take 1 tablet (800 mg total) by mouth 3 (three) times daily.   oxyCODONE-acetaminophen 5-325 MG tablet Commonly known as: PERCOCET/ROXICET Take 1 tablet by mouth every 4 (four) hours as needed for moderate pain.        Follow-up Information     [facility]. Schedule an appointment as soon as possible for a visit in 4 week(s).   Contact information: [address] [place] [address] [phone]                Signed: Hermina Staggers 10/07/2022, 9:36 AM

## 2022-10-07 NOTE — Progress Notes (Signed)
Pt discharged to home in stable condition.  All discharge instructions reviewed with and given to pt.  Pt gives complete verbal understanding of discharge instructions and to pick up prescriptions from designated pharmacy.  Pt transported of unit via wheelchair with RN x1 at chairside to private vehicle for transport home.

## 2022-10-08 ENCOUNTER — Encounter (HOSPITAL_COMMUNITY): Payer: Self-pay | Admitting: Obstetrics and Gynecology

## 2022-10-08 ENCOUNTER — Telehealth (INDEPENDENT_AMBULATORY_CARE_PROVIDER_SITE_OTHER): Payer: Self-pay

## 2022-10-08 NOTE — Transitions of Care (Post Inpatient/ED Visit) (Signed)
   10/15/2022  Name: Caitlin Maddox MRN: [id number] DOB: 1980-05-24  Today's TOC FU Call Status: Today's TOC FU Call Status:: Unsuccessful Call (3rd Attempt) Unsuccessful Call (2nd Attempt) Date: 10/14/22 Unsuccessful Call (3rd Attempt) Date: 10/15/22  Attempted to reach the patient regarding the most recent Inpatient/ED visit.  Follow Up Plan: No further outreach attempts will be made at this time. We have been unable to contact the patient.  Signature  Fredirick Maudlin

## 2022-10-09 LAB — SURGICAL PATHOLOGY

## 2022-10-12 NOTE — Anesthesia Postprocedure Evaluation (Signed)
Anesthesia Post Note  Patient: Caitlin Maddox  Procedure(s) Performed: HYSTERECTOMY VAGINAL WITH RIGHT SALPINGECTOMY (Bilateral: Uterus)     Patient location during evaluation: PACU Anesthesia Type: General Level of consciousness: sedated Pain management: pain level controlled Vital Signs Assessment: post-procedure vital signs reviewed and stable Respiratory status: spontaneous breathing Cardiovascular status: stable Postop Assessment: no apparent nausea or vomiting Anesthetic complications: no  No notable events documented.  Last Vitals:  Vitals:   10/07/22 0615 10/07/22 0908  BP: (!) 155/97 (!) 157/93  Pulse: 69 74  Resp: 18 17  Temp: 37.3 C 37.2 C  SpO2: 100% 99%    Last Pain:  Vitals:   10/07/22 0908  TempSrc: Oral  PainSc:                  Caren Macadam

## 2022-10-21 ENCOUNTER — Encounter (INDEPENDENT_AMBULATORY_CARE_PROVIDER_SITE_OTHER): Payer: Self-pay | Admitting: Primary Care

## 2022-10-21 ENCOUNTER — Ambulatory Visit (INDEPENDENT_AMBULATORY_CARE_PROVIDER_SITE_OTHER): Payer: 59 | Admitting: Primary Care

## 2022-10-21 VITALS — BP 108/74 | HR 83 | Resp 16 | Ht 72.0 in | Wt 217.0 lb

## 2022-10-21 DIAGNOSIS — M545 Low back pain, unspecified: Secondary | ICD-10-CM

## 2022-10-21 DIAGNOSIS — F418 Other specified anxiety disorders: Secondary | ICD-10-CM

## 2022-10-21 MED ORDER — BUSPIRONE HCL 5 MG PO TABS
5.00 mg | ORAL_TABLET | Freq: Two times a day (BID) | ORAL | 1 refills | Status: DC
Start: 2022-10-21 — End: 2022-11-13

## 2022-10-21 NOTE — Progress Notes (Signed)
  [facility]   Subjective:   Caitlin Maddox is a 42 y.o. female presents  hospital follow up followed by OBGYN procedure on 10/06/22. Today she expresses feeling very depressed no purpose children are teens and involved in their own world, unable to go back to work. One thing that she enjoys doing. Discussed purposes kids still needing her guidance , her mother is living -although she is active and still working they can do things together. She has thought about suicide but no intended mode of action she denies harming others or visual or auditory hallucinations. C/O back pain- HYSTERECTOMY VAGINAL WITH RIGHT SALPINGECTOMY on 10/06/22-recovering phase can f/u with GYN.  Past Medical History:  Diagnosis Date   Cancer [facility])    Depression    Dysfunctional uterine bleeding    Pelvic mass in female    Retroperitoneal lymphadenopathy    Symptomatic anemia      Allergies  Allergen Reactions   Clindamycin/Lincomycin Anaphylaxis and Swelling      Current Outpatient Medications on File Prior to Visit  Medication Sig Dispense Refill   ibuprofen (ADVIL) 800 MG tablet Take 1 tablet (800 mg total) by mouth 3 (three) times daily. 30 tablet 0   oxyCODONE-acetaminophen (PERCOCET/ROXICET) 5-325 MG tablet Take 1 tablet by mouth every 4 (four) hours as needed for moderate pain. (Patient not taking: Reported on 10/21/2022) 30 tablet 0   No current facility-administered medications on file prior to visit.   Review of System: Review of Systems  Psychiatric/Behavioral:  Positive for depression and suicidal ideas. The patient is nervous/anxious and has insomnia.   All other systems reviewed and are negative.   Objective:  Blood Pressure 108/74   Pulse 83   Respiration 16   Height 6' (1.829 m)   Weight 217 lb (98.4 kg)   Oxygen Saturation 98%   Body Mass Index 29.43 kg/m   Filed Weights   10/21/22 1513  Weight: 217 lb (98.4 kg)    Physical Exam: General Appearance: Well nourished  obese female in no apparent distress. Eyes: PERRLA, EOMs, conjunctiva no swelling or erythema Sinuses: No Frontal/maxillary tenderness ENT/Mouth: Ext aud canals clear, TMs without erythema, bulging. Hearing normal.  Neck: Supple, thyroid normal.  Respiratory: Respiratory effort normal, BS equal bilaterally without rales, rhonchi, wheezing or stridor.  Cardio: RRR with no MRGs. Brisk peripheral pulses without edema.  Abdomen: Soft, + BS.  Non tender, no guarding, rebound, hernias, masses. Lymphatics: Non tender without lymphadenopathy.  Musculoskeletal: Full ROM, 5/5 strength, normal gait.  Skin: Warm, dry without rashes, lesions, ecchymosis.  Neuro: Cranial nerves intact. Normal muscle tone, no cerebellar symptoms. Sensation intact.  Psych: Awake and oriented X 3, normal affect, Insight and Judgment appropriate.    Assessment:  Scott was seen today for hospitalization follow-up, anxiety, depression and back pain.  Diagnoses and all orders for this visit:  Depression with anxiety -     busPIRone (BUSPAR) 5 MG tablet; Take 1 tablet (5 mg total) by mouth 2 (two) times daily.  Acute right-sided low back pain without sciatica Recovering from surgery and bed rest re-eval      This note has been created with Education officer, environmental. Any transcriptional errors are unintentional.   Grayce Sessions, NP 10/21/2022, 4:03 PM

## 2022-10-21 NOTE — Patient Instructions (Signed)
Buspirone Tablets What is this medication? BUSPIRONE (byoo SPYE rone) treats anxiety. It works by balancing the levels of dopamine and serotonin in your brain, substances that help regulate mood. This medicine may be used for other purposes; ask your health care provider or pharmacist if you have questions. COMMON BRAND NAME(S): BuSpar, Buspar Dividose What should I tell my care team before I take this medication? They need to know if you have any of these conditions: Kidney or liver disease An unusual or allergic reaction to buspirone, other medications, foods, dyes, or preservatives Pregnant or trying to get pregnant Breast-feeding How should I use this medication? Take this medication by mouth with a glass of water. Follow the directions on the prescription label. You may take this medication with or without food. To ensure that this medication always works the same way for you, you should take it either always with or always without food. Take your doses at regular intervals. Do not take your medication more often than directed. Do not stop taking except on the advice of your care team. Talk to your care team about the use of this medication in children. Special care may be needed. Overdosage: If you think you have taken too much of this medicine contact a poison control center or emergency room at once. NOTE: This medicine is only for you. Do not share this medicine with others. What if I miss a dose? If you miss a dose, take it as soon as you can. If it is almost time for your next dose, take only that dose. Do not take double or extra doses. What may interact with this medication? Do not take this medication with any of the following: Linezolid MAOIs like Carbex, Eldepryl, Marplan, Nardil, and Parnate Methylene blue Procarbazine This medication may also interact with the following: Diazepam Digoxin Diltiazem Erythromycin Grapefruit juice Haloperidol Medications for mental  depression or mood problems Medications for seizures like carbamazepine, phenobarbital and phenytoin Nefazodone Other medications for anxiety Rifampin Ritonavir Some antifungal medications like itraconazole, ketoconazole, and voriconazole Verapamil Warfarin This list may not describe all possible interactions. Give your health care provider a list of all the medicines, herbs, non-prescription drugs, or dietary supplements you use. Also tell them if you smoke, drink alcohol, or use illegal drugs. Some items may interact with your medicine. What should I watch for while using this medication? Visit your care team for regular checks on your progress. It may take 1 to 2 weeks before your anxiety gets better. This medication may affect your coordination, reaction time, or judgment. Do not drive or operate machinery until you know how this medication affects you. Sit up or stand slowly to reduce the risk of dizzy or fainting spells. Drinking alcohol with this medication can increase the risk of these side effects. What side effects may I notice from receiving this medication? Side effects that you should report to your care team as soon as possible: Allergic reactions--skin rash, itching, hives, swelling of the face, lips, tongue, or throat Irritability, confusion, fast or irregular heartbeat, muscle stiffness, twitching muscles, sweating, high fever, seizure, chills, vomiting, diarrhea, which may be signs of serotonin syndrome Side effects that usually do not require medical attention (report to your care team if they continue or are bothersome): Anxiety, nervousness Dizziness Drowsiness Headache Nausea Trouble sleeping This list may not describe all possible side effects. Call your doctor for medical advice about side effects. You may report side effects to FDA at 1-800-FDA-1088. Where should I keep   my medication? Keep out of the reach of children. Store at room temperature below 30 degrees C  (86 degrees F). Protect from light. Keep container tightly closed. Throw away any unused medication after the expiration date. NOTE: This sheet is a summary. It may not cover all possible information. If you have questions about this medicine, talk to your doctor, pharmacist, or health care provider.  2024 Elsevier/Gold Standard (2021-11-17 00:00:00)  

## 2022-11-02 ENCOUNTER — Encounter: Payer: 59 | Admitting: Obstetrics and Gynecology

## 2022-11-12 ENCOUNTER — Other Ambulatory Visit (INDEPENDENT_AMBULATORY_CARE_PROVIDER_SITE_OTHER): Payer: Self-pay | Admitting: Primary Care

## 2022-11-12 DIAGNOSIS — F418 Other specified anxiety disorders: Secondary | ICD-10-CM

## 2022-11-13 ENCOUNTER — Encounter: Payer: Self-pay | Admitting: Obstetrics and Gynecology

## 2022-11-13 ENCOUNTER — Ambulatory Visit (INDEPENDENT_AMBULATORY_CARE_PROVIDER_SITE_OTHER): Payer: 59 | Admitting: Obstetrics and Gynecology

## 2022-11-13 VITALS — BP 122/87 | HR 76 | Wt 206.0 lb

## 2022-11-13 DIAGNOSIS — Z9079 Acquired absence of other genital organ(s): Secondary | ICD-10-CM | POA: Insufficient documentation

## 2022-11-13 DIAGNOSIS — Z90710 Acquired absence of both cervix and uterus: Secondary | ICD-10-CM | POA: Insufficient documentation

## 2022-11-13 DIAGNOSIS — Z9889 Other specified postprocedural states: Secondary | ICD-10-CM

## 2022-11-13 NOTE — Telephone Encounter (Signed)
Requested Prescriptions  Pending Prescriptions Disp Refills   busPIRone (BUSPAR) 5 MG tablet [Pharmacy Med Name: BUSPIRONE HCL 5 MG TABLET] 180 tablet 0    Sig: TAKE 1 TABLET BY MOUTH TWICE A DAY     Psychiatry: Anxiolytics/Hypnotics - Non-controlled Passed - 11/12/2022 11:31 AM      Passed - Valid encounter within last 12 months    Recent Outpatient Visits           3 weeks ago Depression with anxiety   [place] [facility] Grayce Sessions, NP   3 months ago Hospital discharge follow-up   [place] [facility] Grayce Sessions, NP   1 year ago Generalized anxiety disorder   [place] [facility] Grayce Sessions, NP   1 year ago Generalized anxiety disorder   [place] [facility] Grayce Sessions, NP   1 year ago Screening for diabetes mellitus   [place] [facility] Grayce Sessions, NP       Future Appointments             In 3 weeks Randa Evens, Kinnie Scales, NP [place] [facility]

## 2022-11-13 NOTE — Progress Notes (Signed)
Caitlin Maddox presents for post op visit S/P TVH with RS 5/24 Pt has no complaints today Denies any bowel or bladder dysfunction Tolerating diet. No pain meds  Pathology reviewed with pt  PE AF VSS Chaperone present  Lungs clear Heart RRR Abd soft + BS GU Nl EGBUS, cuff well healed, no masses or tenderness  A/P Post OP, TVH/RS  Doing well. Return to nl ADL's. F/U PRN

## 2022-11-13 NOTE — Progress Notes (Signed)
Pt is in office for post op - hyst.

## 2022-11-25 IMAGING — MR MR PELVIS WO/W CM
12 series · 48 of 48 positions shown · IV contrast (Gadavist)
Comparison: CT scan 04/06/2021.  Pelvic ultrasound 04/02/2021

CLINICAL DATA: Pelvic/adnexal masses

EXAM:
MRI PELVIS WITHOUT AND WITH CONTRAST
TECHNIQUE: Multiplanar multisequence MR imaging of the pelvis was performed
both before and after administration of intravenous contrast.
CONTRAST:  8.5mL GADAVIST GADOBUTROL 1 MMOL/ML IV SOLN

[Series 3: T2 · coronal · 5.0mm · 1.41mm/px · 1 of 36 slices shown]
[im 1/36]
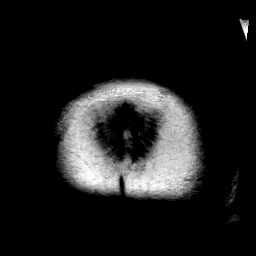

[Series 5: ax tse trig · axial · 5.0mm · 0.88mm/px · z∈[-48,+186]mm · 3 of 40 slices shown]
[im 1/40]
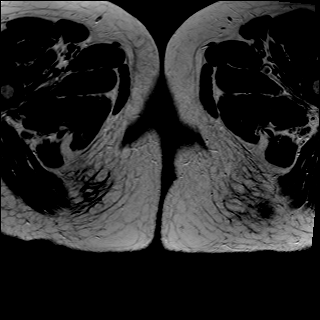
[im 20/40]
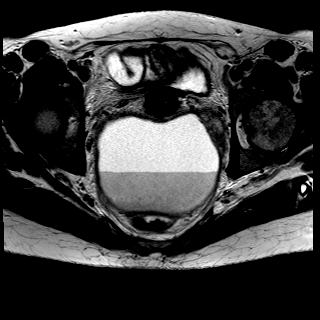
[im 40/40]
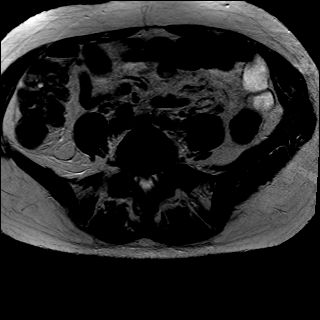

[Series 6: ax tse fs · axial · 5.0mm · 0.88mm/px · z∈[-39,+177]mm · 2 of 37 slices shown]
[im 1/37]
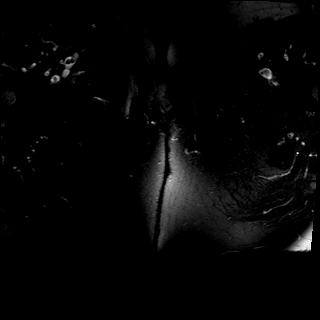
[im 37/37]
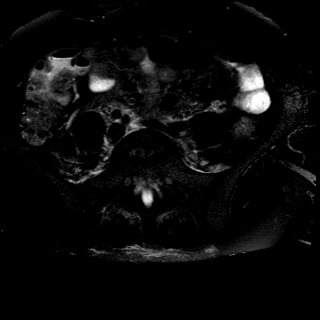

[Series 7: sag tse trig · sagittal · 5.0mm · 0.75mm/px · 2 of 33 slices shown]
[im 1/33]
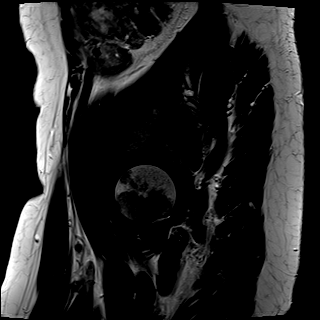
[im 33/33]
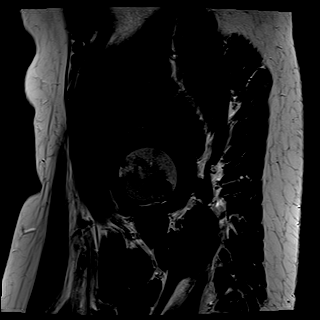

[Series 8: t1_vibe_fs_tra_p4_bh_pre · axial · 3.0mm · 0.88mm/px · z∈[-20,+193]mm · 5 of 72 slices shown]
[im 1/72]
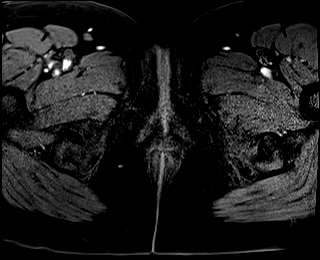
[im 18/72]
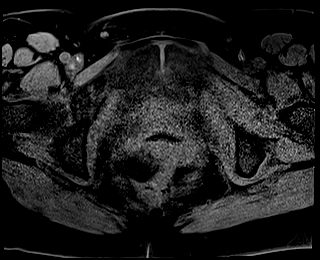
[im 36/72]
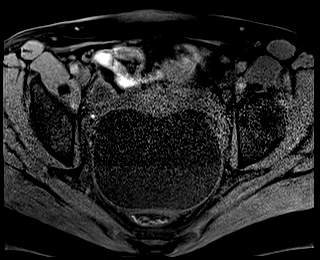
[im 54/72]
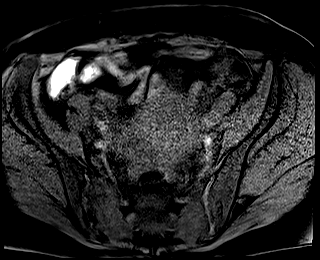
[im 72/72]
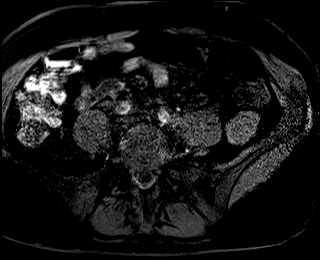

[Series 9: t1_vibe_fs_tra_p4_bh_post · axial · 3.0mm · 0.88mm/px · z∈[-20,+193]mm · 5 of 72 slices shown (1 of 3)]
[im 1/72]
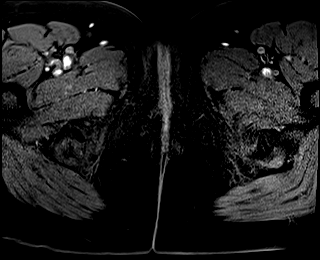
[im 18/72]
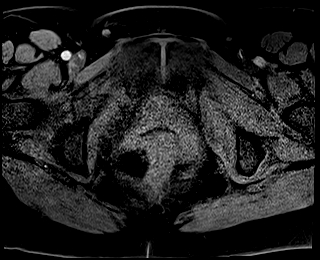
[im 36/72]
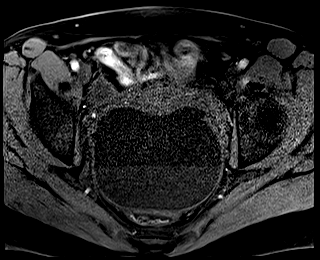
[im 54/72]
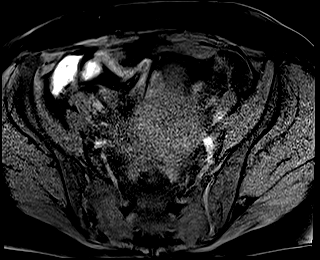
[im 72/72]
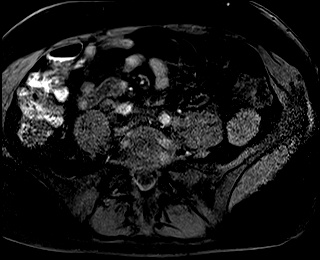

[Series 10: t1_vibe_fs_tra_p4_bh_post_sub · axial · 3.0mm · 0.88mm/px · z∈[-20,+193]mm · 5 of 71 slices shown (1 of 3)]
[im 1/71]
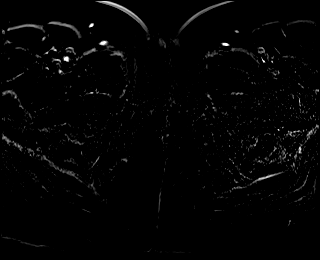
[im 18/71]
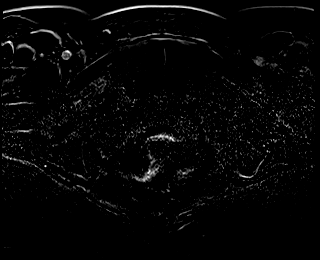
[im 36/71]
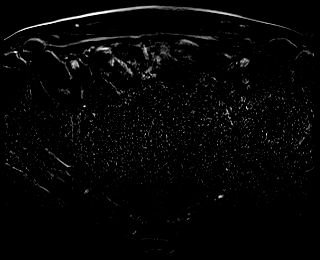
[im 53/71]
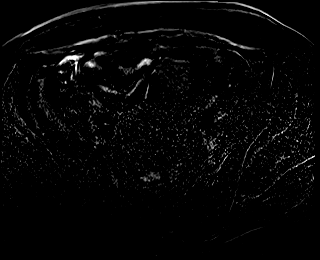
[im 71/71]
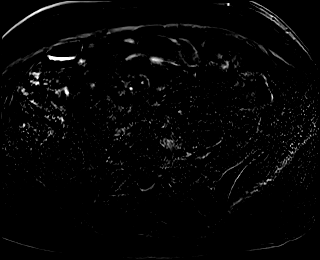

[Series 11: t1_vibe_fs_tra_p4_bh_post · axial · 3.0mm · 0.88mm/px · z∈[-20,+193]mm · 5 of 72 slices shown (2 of 3)]
[im 1/72]
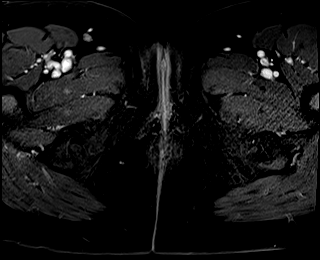
[im 18/72]
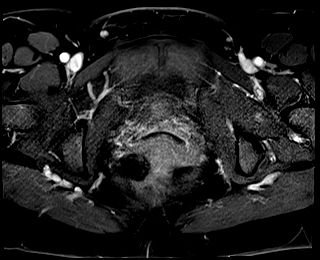
[im 36/72]
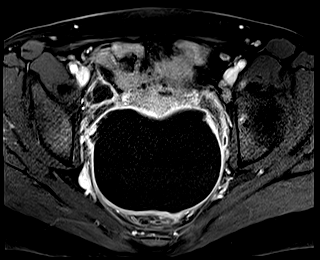
[im 54/72]
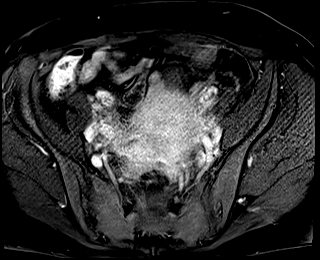
[im 72/72]
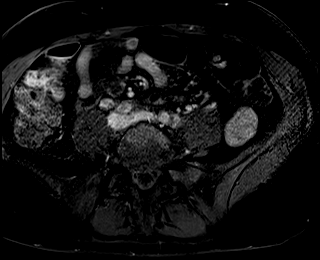

[Series 12: t1_vibe_fs_tra_p4_bh_post_sub · axial · 3.0mm · 0.88mm/px · z∈[-20,+193]mm · 5 of 72 slices shown (2 of 3)]
[im 1/72]
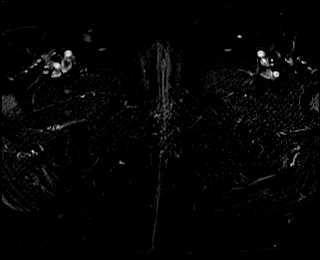
[im 18/72]
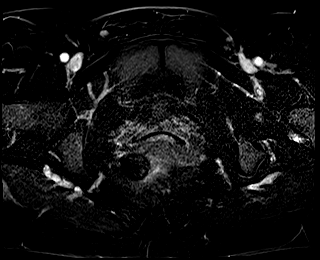
[im 36/72]
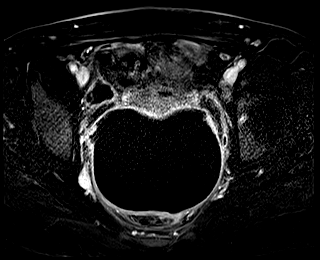
[im 54/72]
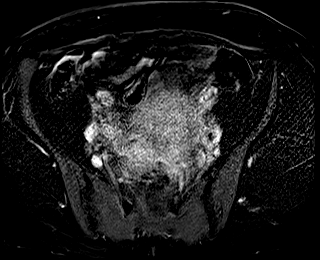
[im 72/72]
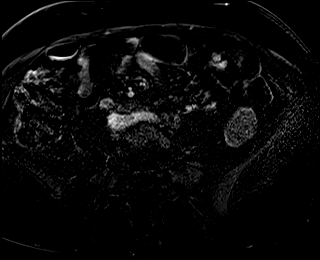

[Series 13: t1_vibe_fs_tra_p4_bh_post · axial · 3.0mm · 0.88mm/px · z∈[-20,+193]mm · 5 of 72 slices shown (3 of 3)]
[im 1/72]
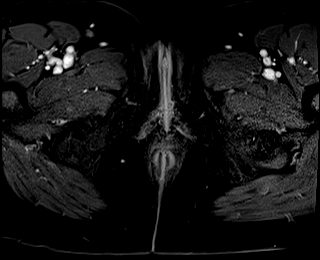
[im 18/72]
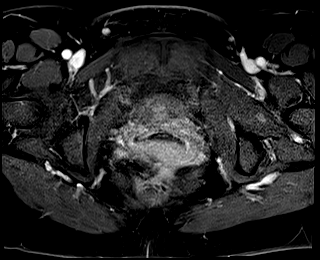
[im 36/72]
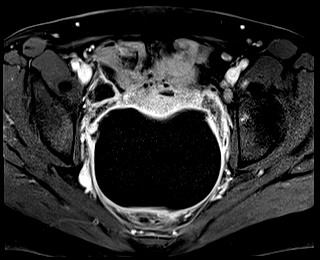
[im 54/72]
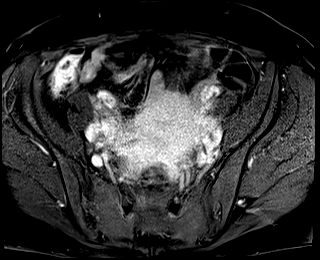
[im 72/72]
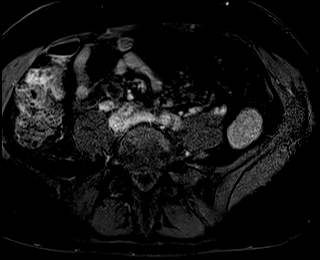

[Series 14: t1_vibe_fs_tra_p4_bh_post_sub · axial · 3.0mm · 0.88mm/px · z∈[-20,+193]mm · 5 of 72 slices shown (3 of 3)]
[im 1/72]
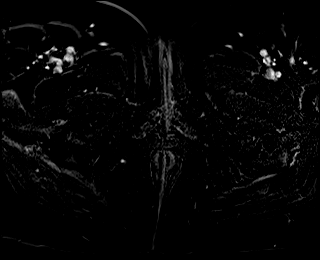
[im 18/72]
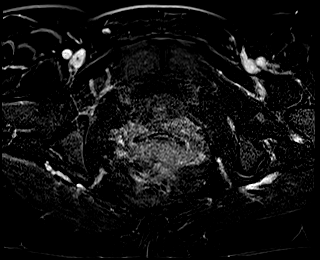
[im 36/72]
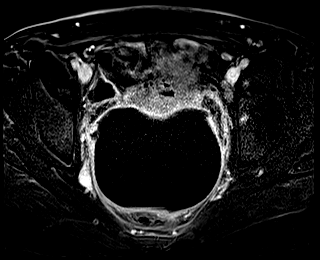
[im 54/72]
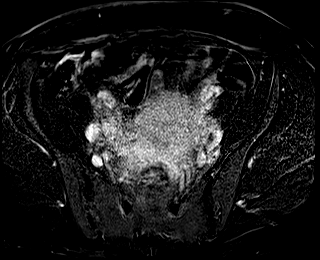
[im 72/72]
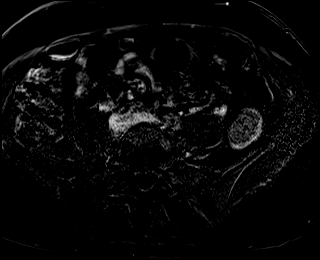

[Series 15: T1 dynamic post-contrast · coronal · 3.0mm · 1.31mm/px · 5 of 72 slices shown]
[im 1/72]
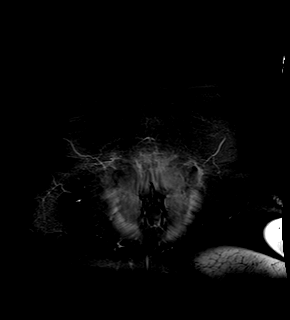
[im 18/72]
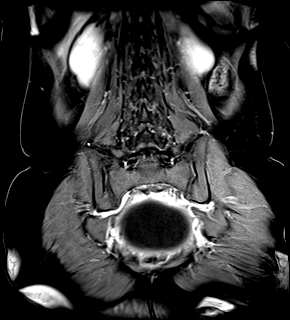
[im 36/72]
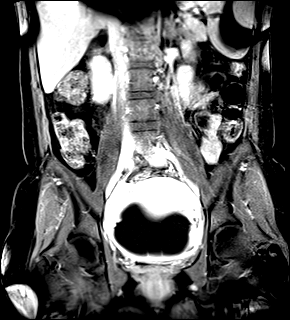
[im 54/72]
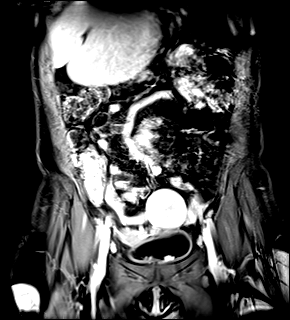
[im 72/72]
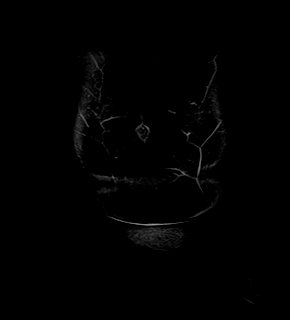

[48 of 48 positions shown; findings below may reference images not displayed]

FINDINGS: Urinary Tract:  Urinary bladder unremarkable

Bowel:  No abnormality observed

Vascular/Lymphatic: Right external iliac node 1.0 cm in short axis
on image 44 series 11. The right external iliac vein is somewhat
narrowed by the adjacent ovary.

Reproductive: 2 dominant low signal intensity uterine fibroids are
identified, anterior fibroid 4.8 by 4.5 by 5.0 cm and the more
posterior fibroid measuring 4.7 by 3.8 by 3.2 cm. This more
posterior lower uterine segment fibroid has a submucosal component.
The endometrium does not appear thickened. There is low T2 signal
intensity throughout the myometrium and adenomyosis is not excluded.

The right ovary measures about 5.0 by 2.4 by 4.0 cm, and appears to
contain a 2.3 by 2.0 by 2.5 cm anterior cystic lesion with only
marginal enhancement. This lesion has mixed precontrast T2 signal
but primarily high, and is likely a mildly complex cyst.

The left ovary measures approximately 3.6 by 1.8 by 2.7 cm.

In the cul-de-sac region and abutting both ovaries, we demonstrate a
11.2 by 8.8 by 9.7 cm cystic lesion with an internal fluid-fluid
level which could reflect hemorrhagic or infected contents. This
only has fairly thin marginal enhancement which would seemingly be
unusual for infected lesion. There is some enhancing nodularity
along the left anterior margin on image 39 of series 11 such that a
small soft tissue nodule in this lesion is not excluded.

Other: On T2 weighted images, there appears to be some
retroperitoneal edema extending along the pelvic sidewalls, and also
edema in the presacral space, as shown for example on series 6.

Musculoskeletal: No bony abnormality is observed.
IMPRESSION: 1. Large cystic lesion in the cul-de-sac has a fluid-fluid level and
only mild marginal enhancement although does have some nodular
enhancing components along the left anterior margin and to a lesser
extent along the right anterior margin adjacent to the ovaries.
Given that this is new compared to 02/01/2021, I am skeptical that
it represents an ovarian malignancy. More likely this is some form
of hemorrhagic cystic lesion or less likely abscess in the
cul-de-sac region. A could be arising from either ovary based on the
abutment of both ovaries. There is edema tracking around both
ovaries and in the pelvic sidewalls which is nonspecific, along with
nonspecific presacral edema. Presumably this could be related to
inflammation in the region, and if the has fever then the
possibility of a large tubo-ovarian abscess extending in the
cul-de-sac should be considered.
2. Mildly complex cystic lesion but without internal nodular
enhancement anteriorly in the right ovary, probably a benign complex
cyst given the thin marginal enhancement.
3. Notable uterine fibroids including a lower uterine segment
subserosal fibroid.
4. Borderline right external iliac adenopathy, nonspecific.

## 2022-11-27 IMAGING — CT CT CHEST W/ CM
2 of 4 series · 15 of 36 positions shown, 18 images · IV contrast (APPLIED)
Comparison: MR pelvis 04/07/2021. CT abdomen and pelvis 04/06/2021.

CLINICAL DATA: Lymphadenopathy.  Abdominal pain and splenomegaly.

EXAM:
CT CHEST WITH CONTRAST
TECHNIQUE: Multidetector CT imaging of the chest was performed during
intravenous contrast administration.
CONTRAST:  75mL OMNIPAQUE IOHEXOL 300 MG/ML  SOLN

[Series 3: chest w · axial · 0.66mm/px · z∈[+1255,+1475]mm · 12 of 131 slices shown, 15 images]
[im 11/131  mediastinal]
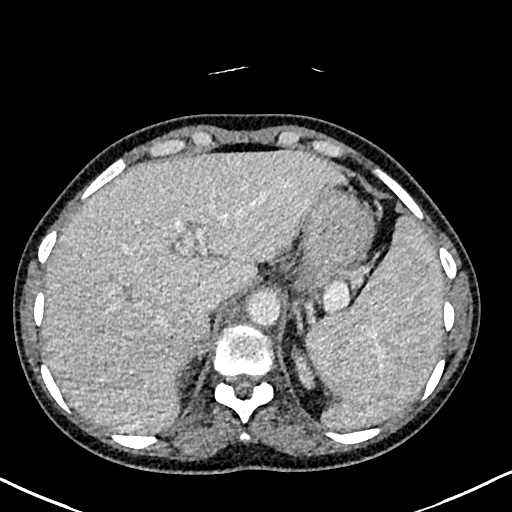
[im 11/131  lung]
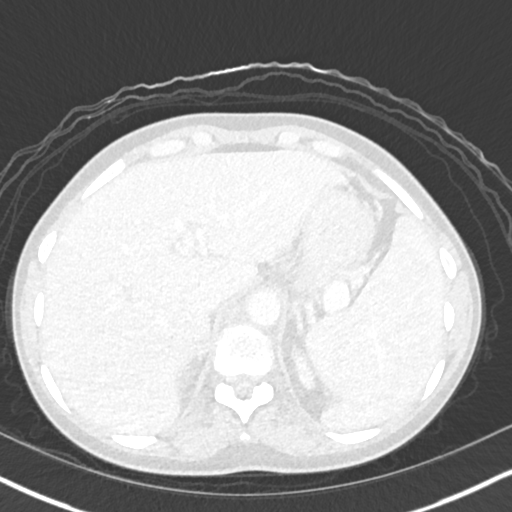
[im 21/131  lung]
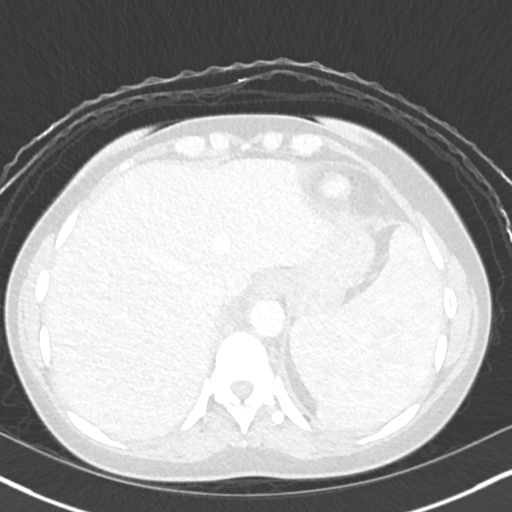
[im 31/131  lung]
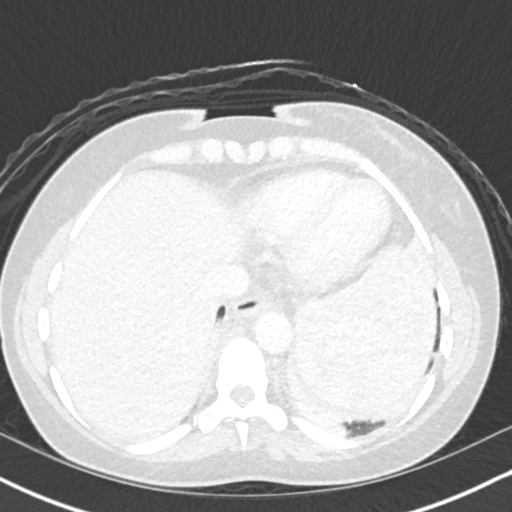
[im 41/131  lung]
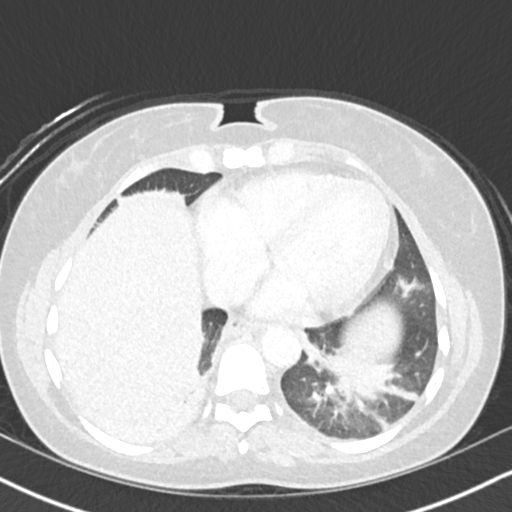
[im 51/131  mediastinal]
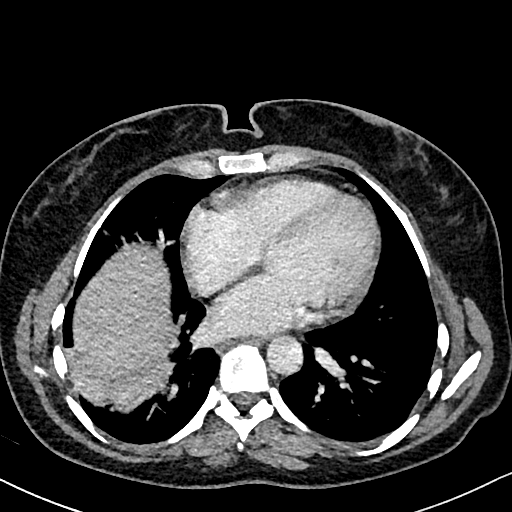
[im 51/131  lung]
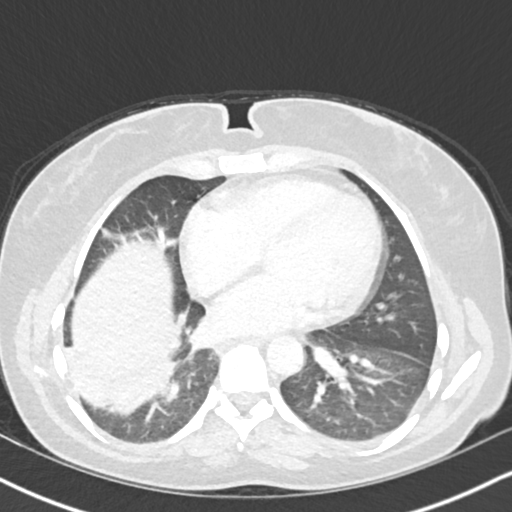
[im 61/131  lung]
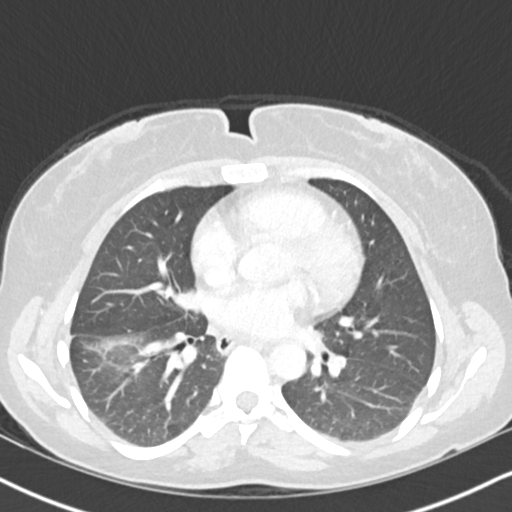
[im 71/131  lung]
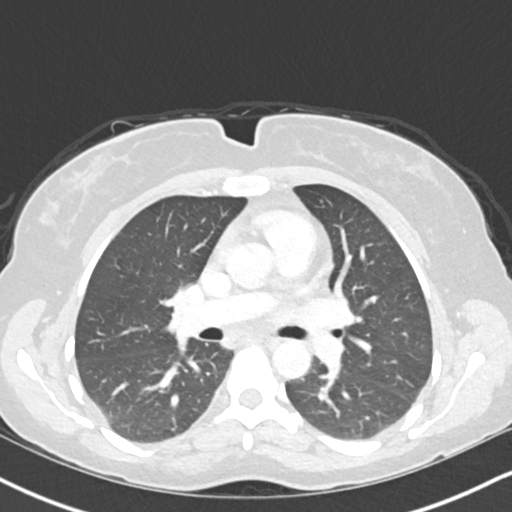
[im 81/131  lung]
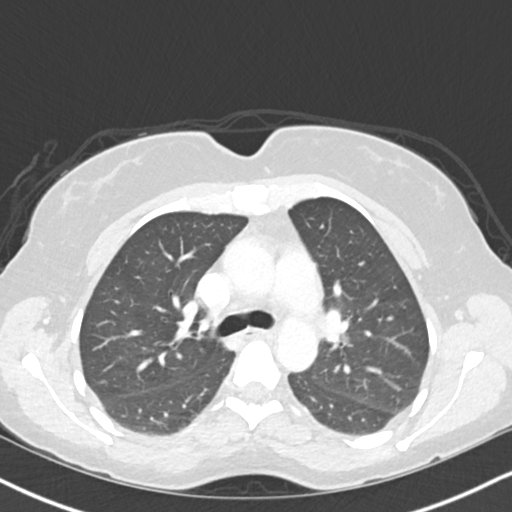
[im 91/131  mediastinal]
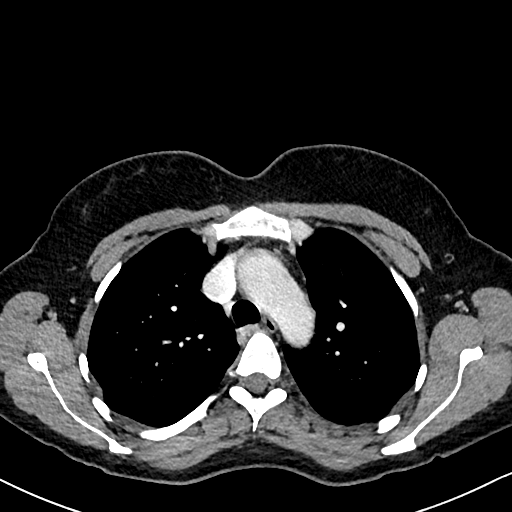
[im 91/131  lung]
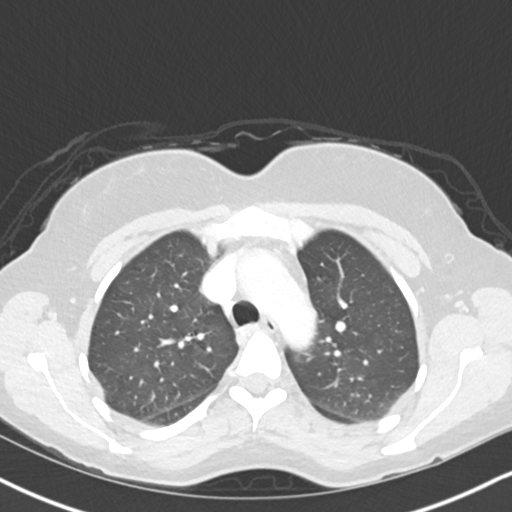
[im 101/131  lung]
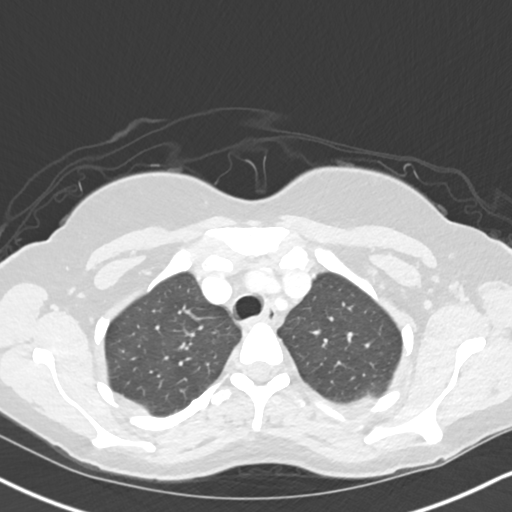
[im 111/131  lung]
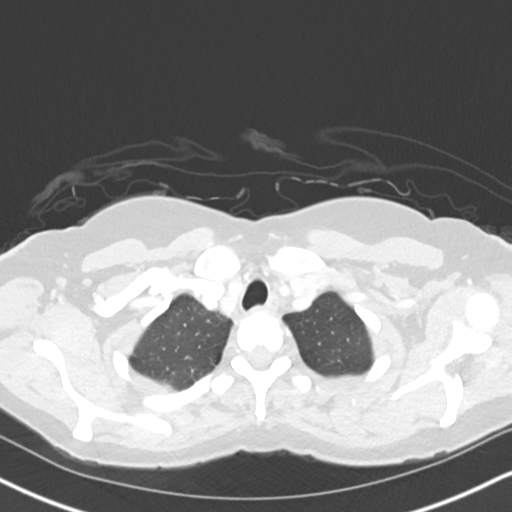
[im 121/131  lung]
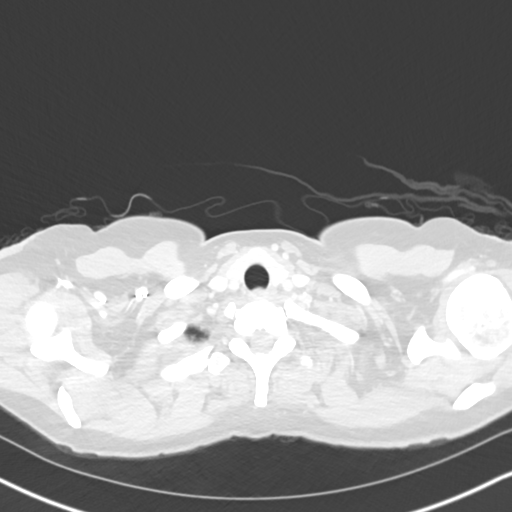

[Series 6: cor · coronal · 0.51mm/px · 3 of 138 slices shown]
[im 28/138  lung]
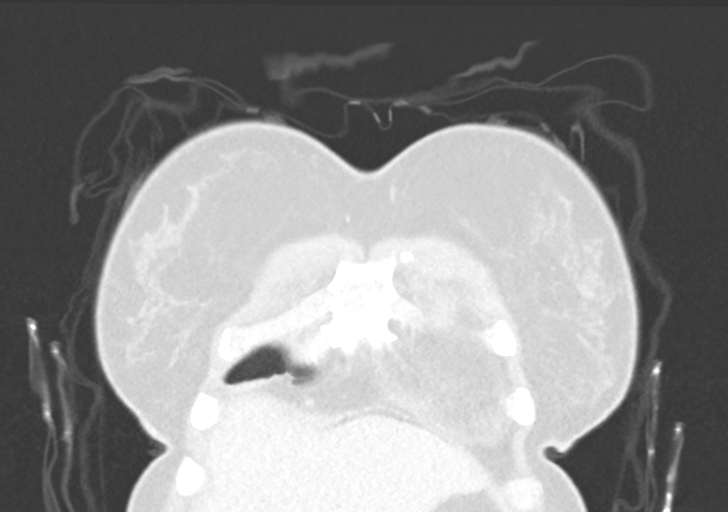
[im 55/138  lung]
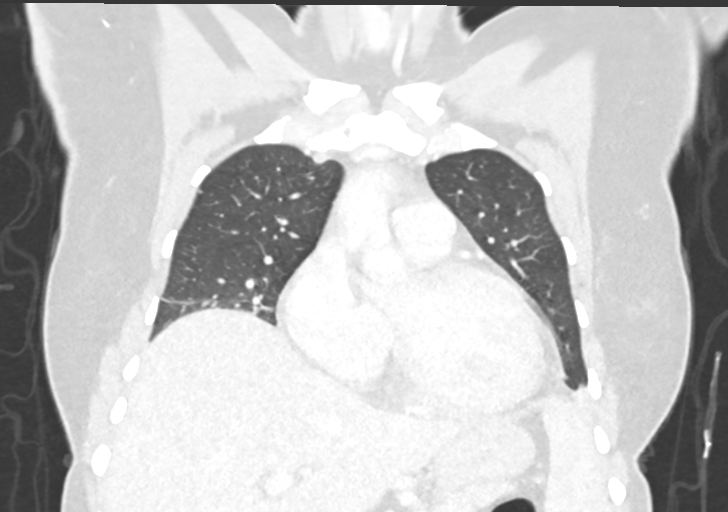
[im 83/138  lung]
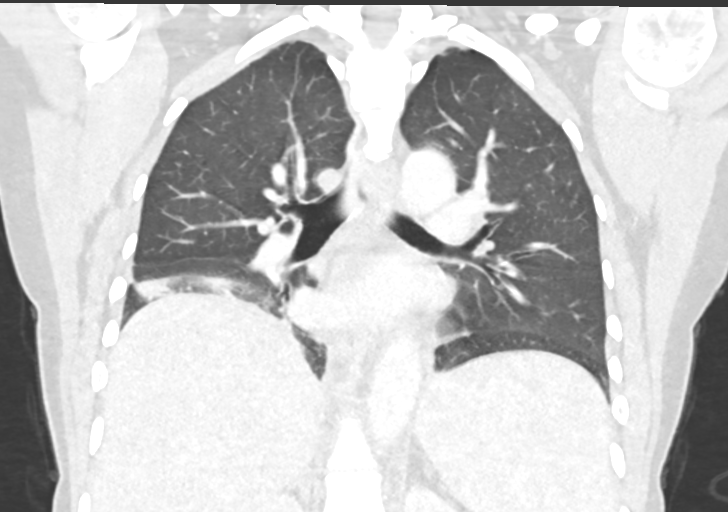

[15 of 36 positions shown; findings below may reference images not displayed]

FINDINGS: Cardiovascular: No significant vascular findings. Normal heart size.
No pericardial effusion.

Mediastinum/Nodes: There are no enlarged mediastinal, hilar or
axillary lymph nodes. Nonenlarged bilateral axillary lymph nodes are
present. The visualized thyroid gland and esophagus are within
normal limits.

Lungs/Pleura: 222 there are atelectatic changes in both lung bases
which have increased compared to the prior study. There is no
pleural effusion or pneumothorax. Trachea and central airways are
patent.

Upper Abdomen: Spleen appears enlarged, unchanged.

Musculoskeletal: No chest wall abnormality. No acute or significant
osseous findings.
IMPRESSION: 1. Bilateral lower lobe atelectatic changes have increased. Can not
exclude infection in the right lung base.
2. No lymphadenopathy in the chest.
3. Splenomegaly.

## 2022-11-29 ENCOUNTER — Ambulatory Visit
Admission: EM | Admit: 2022-11-29 | Discharge: 2022-11-29 | Disposition: A | Discharge status: Home / Self Care | Payer: 59 | Attending: Physician Assistant | Admitting: Physician Assistant

## 2022-11-29 DIAGNOSIS — L0291 Cutaneous abscess, unspecified: Secondary | ICD-10-CM | POA: Diagnosis not present

## 2022-11-29 MED ORDER — DOXYCYCLINE HYCLATE 100 MG PO CAPS: 100.0000 mg | ORAL_CAPSULE | Freq: Two times a day (BID) | ORAL | 0 refills | Status: AC

## 2022-11-29 NOTE — Discharge Instructions (Signed)
Take antibiotic as prescribed. Please follow up if no gradual improvement or with any further concerns.

## 2022-11-29 NOTE — ED Provider Notes (Signed)
[name]    CSN: [id number] Arrival date & time: 11/29/22  1202      History   Chief Complaint Chief Complaint  Patient presents with   Insect Bite    HPI Tom Ragsdale is a 42 y.o. female.   Patient here today for evaluation of possible infection after having bug bite to lower back that initially was itchy but has now become painful to the surrounding area.  She denies any fever.  She denied any nausea or vomiting.  She does not report treatment for symptoms.  The history is provided by the patient.    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 C-section x 2.           Home Medications    Prior to Admission medications   Medication Sig Start Date End Date Taking? Authorizing Provider  busPIRone (BUSPAR) 5 MG tablet TAKE 1 TABLET BY MOUTH TWICE A DAY 11/13/22  Yes Grayce Sessions, NP  doxycycline (VIBRAMYCIN) 100 MG capsule Take 1 capsule (100  mg total) by mouth 2 (two) times daily for 7 days. 11/29/22 12/06/22 Yes Tomi Bamberger, PA-C  ibuprofen (ADVIL) 800 MG tablet Take 1 tablet (800 mg total) by mouth 3 (three) times daily. 10/07/22   Hermina Staggers, MD    Family History Family History  Problem Relation Age of Onset   Hypertension Mother     Social History Social History   Tobacco Use   Smoking status: Former    Current packs/day: 0.00    Average packs/day: 1 pack/day for 33.0 years (33.0 ttl pk-yrs)    Types: Cigarettes    Start date: 10/10/1987    Quit date: 10/09/2020    Years since quitting: 2.1   Smokeless tobacco: Never  Vaping Use   Vaping status: Every Day   Substances: Nicotine, Flavoring  Substance Use Topics   Alcohol use: No   Drug use: No     Allergies   Clindamycin/lincomycin   Review of Systems Review of Systems  Constitutional:  Negative for chills and fever.  Eyes:  Negative for discharge and redness.  Gastrointestinal:  Negative for nausea and vomiting.  Skin:  Positive for color change.     Physical Exam Triage Vital Signs ED Triage Vitals  Encounter Vitals Group     BP 11/29/22 1218 124/84     Systolic BP Percentile --      Diastolic BP Percentile --      Pulse Rate 11/29/22 1218 81     Resp 11/29/22 1218 18     Temp 11/29/22 1218 99 F (37.2 C)     Temp Source 11/29/22 1218 Oral     SpO2 11/29/22 1218 98 %     Weight 11/29/22 1217 205 lb 14.6 oz (93.4 kg)     Height 11/29/22 1217 6' (1.829 m)     Head Circumference --      Peak Flow --      Pain Score 11/29/22 1216 10     Pain Loc --      Pain Education --      Exclude from Growth Chart --    No data found.  Updated Vital Signs BP 124/84 (BP Location: Left Arm)   Pulse 81   Temp 99 F (37.2 C) (Oral)   Resp 18   Ht 6' (1.829 m)   Wt 205 lb 14.6 oz (93.4 kg)   LMP 08/29/2022 (Exact Date)   SpO2 98%   BMI 27.93 kg/m       Physical Exam Vitals and nursing note reviewed.  Constitutional:      General:  She is not in acute distress.    Appearance: Normal appearance. She is not ill-appearing.  HENT:     Head: Normocephalic and atraumatic.  Eyes:     Conjunctiva/sclera: Conjunctivae normal.  Cardiovascular:     Rate and Rhythm: Normal rate.  Pulmonary:     Effort: Pulmonary effort is normal. No respiratory distress.  Skin:    Comments: Approximately 5 cm area of erythema with mild induration noted to mid lower back with central wound.  Mild tenderness to palpation noted.  No fluctuance.  No active drainage or bleeding.  Neurological:     Mental Status: She is alert.  Psychiatric:        Mood and Affect: Mood normal.        Behavior: Behavior normal.        Thought Content: Thought content normal.      UC Treatments / Results  Labs (all labs ordered are listed, but only abnormal results are displayed) Labs Reviewed - No data to display  EKG   Radiology No results found.  Procedures Procedures (including critical care time)  Medications Ordered in UC Medications - No data to display  Initial Impression / Assessment and Plan / UC Course  I have reviewed the triage vital signs and the nursing notes.  Pertinent labs & imaging results that were available during my care of the patient were reviewed by me and considered in my medical decision making (see chart for details).    Will treat to cover possible abscess with doxycycline.  Encouraged follow-up if no gradual improvement or with any worsening symptoms.  Final Clinical Impressions(s) / UC Diagnoses   Final diagnoses:  Abscess     Discharge Instructions      Take antibiotic as prescribed. Please follow up if no gradual improvement or with any further concerns.    ED Prescriptions     Medication Sig Dispense Auth. Provider   doxycycline (VIBRAMYCIN) 100 MG capsule Take 1 capsule (100 mg total) by  mouth 2 (two) times daily for 7 days. 14 capsule Tomi Bamberger, PA-C      PDMP not reviewed this  encounter.   Tomi Bamberger, PA-C 11/29/22 [phone]

## 2022-11-29 NOTE — ED Triage Notes (Signed)
"  Bug/insect bite on mid lower back now causing swelling, redness and pain". First noticed "3 days ago". No fever.

## 2022-12-10 ENCOUNTER — Ambulatory Visit (INDEPENDENT_AMBULATORY_CARE_PROVIDER_SITE_OTHER): Payer: 59 | Admitting: Primary Care

## 2023-07-03 ENCOUNTER — Emergency Department (HOSPITAL_COMMUNITY)
Admission: EM | Admit: 2023-07-03 | Discharge: 2023-07-04 | Disposition: A | Discharge status: Home / Self Care | Payer: 59 | Attending: Emergency Medicine | Admitting: Emergency Medicine

## 2023-07-03 ENCOUNTER — Other Ambulatory Visit: Payer: Self-pay

## 2023-07-03 ENCOUNTER — Encounter (HOSPITAL_COMMUNITY): Payer: Self-pay | Admitting: *Deleted

## 2023-07-03 DIAGNOSIS — A599 Trichomoniasis, unspecified: Secondary | ICD-10-CM | POA: Diagnosis not present

## 2023-07-03 DIAGNOSIS — L292 Pruritus vulvae: Secondary | ICD-10-CM | POA: Diagnosis present

## 2023-07-03 LAB — URINALYSIS, W/ REFLEX TO CULTURE (INFECTION SUSPECTED)
Bilirubin Urine: NEGATIVE
Glucose, UA: NEGATIVE mg/dL
Hgb urine dipstick: NEGATIVE
Ketones, ur: NEGATIVE mg/dL
Nitrite: NEGATIVE
Protein, ur: NEGATIVE mg/dL
Specific Gravity, Urine: 1.029 (ref 1.005–1.030)
pH: 5 (ref 5.0–8.0)

## 2023-07-03 LAB — WET PREP, GENITAL
Sperm: NONE SEEN
WBC, Wet Prep HPF POC: <10
Yeast Wet Prep HPF POC: NONE SEEN

## 2023-07-03 LAB — HIV ANTIBODY (ROUTINE TESTING W REFLEX): HIV Screen 4th Generation wRfx: NONREACTIVE

## 2023-07-03 NOTE — ED Provider Triage Note (Signed)
 Emergency Medicine Provider Triage Evaluation Note  Caitlin Maddox , a 43 y.o. female  was evaluated in triage.  Pt complains of vaginal discharge.  Review of Systems  Positive:  Negative:   Physical Exam  BP 130/83 (BP Location: Right Arm)   Pulse 81   Temp 98.1 F (36.7 C) (Oral)   Resp 16   LMP 08/29/2022 (Exact Date)   SpO2 98%  Gen:   Awake, no distress   Resp:  Normal effort  MSK:   Moves extremities without difficulty  Other:    Medical Decision Making  Medically screening exam initiated at 7:37 PM.  Appropriate orders placed.  Caitlin Maddox was informed that the remainder of the evaluation will be completed by another provider, this initial triage assessment does not replace that evaluation, and the importance of remaining in the ED until their evaluation is complete.  Vaginal discharge x2 weeks. Thin, clearish-white discharge. Also complaining of vaginal sores.    Caitlin Maddox, [place] 07/03/23 [phone]

## 2023-07-03 NOTE — ED Notes (Signed)
Pt called X2 for vitals recheck. Pt could not be found.  

## 2023-07-03 NOTE — ED Triage Notes (Signed)
 The pt has had vaginal itching for 2 weeks  lmp none

## 2023-07-04 ENCOUNTER — Emergency Department (HOSPITAL_COMMUNITY): Payer: 59

## 2023-07-04 LAB — RPR
RPR Ser Ql: REACTIVE — AB
RPR Titer: 1:64 {titer}

## 2023-07-04 MED ORDER — DOXYCYCLINE HYCLATE 100 MG PO CAPS: 100.0000 mg | ORAL_CAPSULE | Freq: Two times a day (BID) | ORAL | 0 refills | Status: AC

## 2023-07-04 MED ORDER — CEFTRIAXONE SODIUM 500 MG IJ SOLR
500.0000 mg | Freq: Once | INTRAMUSCULAR | Status: AC
  Administered 2023-07-04: 500 mg via INTRAMUSCULAR
  Filled 2023-07-04: qty 500

## 2023-07-04 MED ORDER — DOXYCYCLINE HYCLATE 100 MG PO CAPS: 100.0000 mg | ORAL_CAPSULE | Freq: Two times a day (BID) | ORAL | 0 refills | Status: DC

## 2023-07-04 MED ORDER — METRONIDAZOLE 500 MG PO TABS
2000.0000 mg | ORAL_TABLET | Freq: Once | ORAL | Status: AC
  Administered 2023-07-04: 2000 mg via ORAL
  Filled 2023-07-04: qty 4

## 2023-07-04 MED ORDER — DOXYCYCLINE HYCLATE 100 MG PO TABS
100.0000 mg | ORAL_TABLET | Freq: Once | ORAL | Status: AC
  Administered 2023-07-04: 100 mg via ORAL
  Filled 2023-07-04: qty 1

## 2023-07-04 NOTE — ED Provider Notes (Signed)
 [place] EMERGENC[facility] DE[facility]MENT AT [facility] Provider Note   CSN: [id number] Arrival date & time: 07/03/23  8295     History  Chief Complaint  Patient presents with   Vaginal Itching    Caitlin Maddox is a 43 y.o. female.  The history is provided by the patient.  Vaginal Itching This is a new problem. The current episode started more than 1 week ago (2.5 weeks). The problem occurs constantly. The problem has not changed since onset.Pertinent negatives include no chest pain, no abdominal pain, no headaches and no shortness of breath. Nothing aggravates the symptoms. Nothing relieves the symptoms. She has tried nothing for the symptoms. The treatment provided no relief.       Home Medications Prior to Admission medications   Medication Sig Start Date End Date Taking? Authorizing Provider  busPIRone (BUSPAR) 5 MG tablet TAKE 1 TABLET BY MOUTH TWICE A DAY 11/13/22   Grayce Sessions, NP  doxycycline (VIBRAMYCIN) 100 MG capsule Take 1 capsule (100 mg total) by mouth 2 (two) times daily. 07/04/23   Viviane Semidey, MD  ibuprofen (ADVIL) 800 MG tablet Take 1 tablet (800 mg total) by mouth 3 (three) times daily. 10/07/22   Hermina Staggers, MD      Allergies    Clindamycin/lincomycin    Review of Systems   Review of Systems  Respiratory:  Negative for shortness of breath.   Cardiovascular:  Negative for chest pain.  Gastrointestinal:  Negative for abdominal pain.  Neurological:  Negative for headaches.  All other systems reviewed and are negative.   Physical Exam Updated Vital Signs BP 113/70 (BP Location: Right Arm)   Pulse 78   Temp 98.6 F (37 C) (Oral)   Resp 16   Ht 6' (1.829 m)   Wt 93.4 kg   LMP 08/29/2022 (Exact Date)   SpO2 100%   BMI 27.93 kg/m  Physical Exam Vitals and nursing note reviewed.  Constitutional:      General: She is not in acute distress.    Appearance: She is well-developed.  HENT:     Head: Normocephalic and atraumatic.      Nose: Nose normal.  Eyes:     Pupils: Pupils are equal, round, and reactive to light.  Cardiovascular:     Rate and Rhythm: Normal rate and regular rhythm.     Pulses: Normal pulses.     Heart sounds: Normal heart sounds.  Pulmonary:     Effort: Pulmonary effort is normal. No respiratory distress.     Breath sounds: Normal breath sounds.  Abdominal:     General: Bowel sounds are normal. There is no distension.     Palpations: Abdomen is soft.     Tenderness: There is no abdominal tenderness. There is no guarding or rebound.  Musculoskeletal:        General: Normal range of motion.     Cervical back: Neck supple.  Skin:    General: Skin is warm and dry.     Capillary Refill: Capillary refill takes less than 2 seconds.     Findings: No erythema or rash.  Neurological:     General: No focal deficit present.     Deep Tendon Reflexes: Reflexes normal.  Psychiatric:        Mood and Affect: Mood normal.     ED Results / Procedures / Treatments   Labs (all labs ordered are listed, but only abnormal results are displayed) Results for orders placed or performed during  the hospital encounter of 07/03/23  Urinalysis, w/ Reflex to Culture (Infection Suspected) -Urine, Clean Catch   Collection Time: 07/03/23  7:39 PM  Result Value Ref Range   Specimen Source URINE, CLEAN CATCH    Color, Urine YELLOW YELLOW   APPearance HAZY (A) CLEAR   Specific Gravity, Urine 1.029 1.005 - 1.030   pH 5.0 5.0 - 8.0   Glucose, UA NEGATIVE NEGATIVE mg/dL   Hgb urine dipstick NEGATIVE NEGATIVE   Bilirubin Urine NEGATIVE NEGATIVE   Ketones, ur NEGATIVE NEGATIVE mg/dL   Protein, ur NEGATIVE NEGATIVE mg/dL   Nitrite NEGATIVE NEGATIVE   Leukocytes,Ua MODERATE (A) NEGATIVE   RBC / HPF 0-5 0 - 5 RBC/hpf   WBC, UA 21-50 0 - 5 WBC/hpf   Bacteria, UA MANY (A) NONE SEEN   Squamous Epithelial / HPF 6-10 0 - 5 /HPF   Mucus PRESENT   HIV Antibody (routine testing w rflx)   Collection Time: 07/03/23  7:40 PM   Result Value Ref Range   HIV Screen 4th Generation wRfx Non Reactive Non Reactive  Wet prep, genital   Collection Time: 07/03/23  7:45 PM   Specimen: PATH Cytology Cervicovaginal Ancillary Only  Result Value Ref Range   Yeast Wet Prep HPF POC NONE SEEN NONE SEEN   Trich, Wet Prep PRESENT (A) NONE SEEN   Clue Cells Wet Prep HPF POC PRESENT (A) NONE SEEN   WBC, Wet Prep HPF POC <10 <10   Sperm NONE SEEN    US PELVIC TRANSABD W/PELVIC DOPPLER Result Date: 07/04/2023 CLINICAL DATA:  Pelvic pain EXAM: TRANSABDOMINAL ULTRASOUND OF PELVIS DOPPLER ULTRASOUND OF OVARIES TECHNIQUE: Transabdominal ultrasound examination of the pelvis was performed including evaluation of the uterus, ovaries, adnexal regions, and pelvic cul-de-sac. Color and duplex Doppler ultrasound was utilized to evaluate blood flow to the ovaries. COMPARISON:  07/22/2022 FINDINGS: Uterus Measurements: Prior hysterectomy Endometrium Thickness: Prior hysterectomy. Right ovary Measurements: 3.3 x 2.0 x 1.9 cm = volume: 6 mL. Normal appearance/no adnexal mass. Left ovary Measurements: 3.3 x 2.2 x 1.8 cm = volume: 6.9 mL. Normal appearance/no adnexal mass. Pulsed Doppler evaluation demonstrates normal low-resistance arterial and venous waveforms in both ovaries. Other: No free fluid.  Patient declined transvaginal imaging. IMPRESSION: No ovarian or adnexal mass.  No evidence of torsion. Prior hysterectomy Electronically Signed   By: Charlett Nose M.D.   On: 07/04/2023 02:32     Radiology US PELVIC TRANSABD W/PELVIC DOPPLER Result Date: 07/04/2023 CLINICAL DATA:  Pelvic pain EXAM: TRANSABDOMINAL ULTRASOUND OF PELVIS DOPPLER ULTRASOUND OF OVARIES TECHNIQUE: Transabdominal ultrasound examination of the pelvis was performed including evaluation of the uterus, ovaries, adnexal regions, and pelvic cul-de-sac. Color and duplex Doppler ultrasound was utilized to evaluate blood flow to the ovaries. COMPARISON:  07/22/2022 FINDINGS: Uterus  Measurements: Prior hysterectomy Endometrium Thickness: Prior hysterectomy. Right ovary Measurements: 3.3 x 2.0 x 1.9 cm = volume: 6 mL. Normal appearance/no adnexal mass. Left ovary Measurements: 3.3 x 2.2 x 1.8 cm = volume: 6.9 mL. Normal appearance/no adnexal mass. Pulsed Doppler evaluation demonstrates normal low-resistance arterial and venous waveforms in both ovaries. Other: No free fluid.  Patient declined transvaginal imaging. IMPRESSION: No ovarian or adnexal mass.  No evidence of torsion. Prior hysterectomy Electronically Signed   By: Charlett Nose M.D.   On: 07/04/2023 02:32    Procedures Procedures    Medications Ordered in ED Medications  metroNIDAZOLE (FLAGYL) tablet 2,000 mg (2,000 mg Oral Given 07/04/23 0127)  cefTRIAXone (ROCEPHIN) injection 500 mg (500 mg  Intramuscular Given 07/04/23 0227)  doxycycline (VIBRA-TABS) tablet 100 mg (100 mg Oral Given 07/04/23 0227)    ED Course/ Medical Decision Making/ A&P                                 Medical Decision Making Patient with vaginal discharge and unprotected encounter  Amount and/or Complexity of Data Reviewed External Data Reviewed: notes.    Details: Present notes reviewed  Labs: ordered.    Details: Pregnancy negative, wet prep negative for trichomonas, HIV negative  Radiology: ordered and independent interpretation performed.    Details: NEgative [place]  Risk Prescription drug management. Risk Details: Will treat for 2 weeks with Doxycycline, Rocephin and Flagyl and Doxy given in the ED.  No sexual activity until 7 days after all partners treated. They may be treated at the county health department.  Always use condoms.  Stable for discharge.      Final Clinical Impression(s) / ED Diagnoses Final diagnoses:  Trichomoniasis    The patient is nontoxic-appearing on exam and vital signs are within normal limits.  I have reviewed the triage vital signs and the nursing notes. Pertinent labs & imaging results that were  available during my care of the patient were reviewed by me and considered in my medical decision making (see chart for details). After history, exam, and medical workup I feel the patient has been appropriately medically screened and is safe for discharge home. Pertinent diagnoses were discussed with the patient. Patient was given return precautions.         Davonne Jarnigan, MD 07/04/23 [phone]

## 2023-07-05 LAB — GC/CHLAMYDIA PROBE AMP (~~LOC~~) NOT AT ARMC
Chlamydia: NEGATIVE
Comment: NEGATIVE
Comment: NORMAL
Neisseria Gonorrhea: NEGATIVE

## 2023-07-05 LAB — T.PALLIDUM AB, TOTAL: T Pallidum Abs: REACTIVE — AB

## 2023-07-08 ENCOUNTER — Telehealth (HOSPITAL_COMMUNITY): Payer: Self-pay

## 2023-08-23 ENCOUNTER — Other Ambulatory Visit: Payer: Self-pay

## 2023-08-23 ENCOUNTER — Ambulatory Visit: Payer: Self-pay

## 2023-08-23 ENCOUNTER — Ambulatory Visit
Admission: EM | Admit: 2023-08-23 | Discharge: 2023-08-23 | Disposition: A | Discharge status: Home / Self Care | Payer: Self-pay | Attending: Family Medicine | Admitting: Family Medicine

## 2023-08-23 ENCOUNTER — Encounter: Payer: Self-pay | Admitting: *Deleted

## 2023-08-23 DIAGNOSIS — S86911A Strain of unspecified muscle(s) and tendon(s) at lower leg level, right leg, initial encounter: Secondary | ICD-10-CM

## 2023-08-23 MED ORDER — TIZANIDINE HCL 4 MG PO TABS: 4.0000 mg | ORAL_TABLET | Freq: Every evening | ORAL | 0 refills | Status: AC | PRN

## 2023-08-23 MED ORDER — PREDNISONE 20 MG PO TABS: 40.0000 mg | ORAL_TABLET | Freq: Every day | ORAL | 0 refills | Status: AC

## 2023-08-23 NOTE — ED Provider Notes (Signed)
 Caitlin Maddox    CSN: [id number] Arrival date & time: 08/23/23  1745      History   Chief Complaint Chief Complaint  Patient presents with   Knee Pain    HPI Caitlin Maddox is a 43 y.o. female.    Knee Pain Patient here today with right knee pain.  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  He is not swollen however she endorses there is pain all around the knee including the posterior aspect of the knee.  Pain and he has been present for 2 weeks.  He has taken Tylenol, Aleve and ibuprofen along with applying heat hot and cold compresses without relief. Patient has multiple excoriated areas all over her skin.  Per patient she picks her skin.  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 C-section x 2.           Home Medications    Prior to Admission medications    Medication Sig Start Date End Date Taking? Authorizing Provider  predniSONE (DELTASONE) 20 MG tablet Take 2 tablets (40 mg total) by mouth daily with breakfast for 5 days. 08/24/23 08/29/23 Yes Buena Carmine, NP  tiZANidine (ZANAFLEX) 4 MG tablet Take 1 tablet (4 mg total) by mouth at bedtime as needed and may repeat dose one time if needed for muscle spasms (Knee pain). 08/23/23  Yes Buena Carmine, NP  busPIRone (BUSPAR) 5 MG tablet TAKE 1 TABLET BY MOUTH TWICE A DAY Patient not taking: Reported on 08/23/2023 11/13/22   Marius Siemens, NP  doxycycline (VIBRAMYCIN) 100 MG capsule Take 1 capsule (100 mg total) by mouth 2 (two) times daily. Patient not taking: Reported on 08/23/2023 07/04/23   Palumbo, April, MD  ibuprofen (ADVIL) 800 MG tablet Take 1 tablet (800 mg total) by mouth 3 (three) times daily. Patient not taking: Reported on 08/23/2023 10/07/22   Othelia Blinks, MD    Family History Family History  Problem Relation Age of Onset   Hypertension Mother     Social  History Social History   Tobacco Use   Smoking status: Former    Current packs/day: 0.00    Average packs/day: 1 pack/day for 33.0 years (33.0 ttl pk-yrs)    Types: Cigarettes    Start date: 10/10/1987    Quit date: 10/09/2020    Years since quitting: 2.8   Smokeless tobacco: Never  Vaping Use   Vaping status: Every Day   Substances: Nicotine, Flavoring  Substance Use Topics   Alcohol use: No   Drug use: No     Allergies   Clindamycin/lincomycin   Review of Systems Review of Systems Pertinent negatives listed in HPI   Physical Exam Triage Vital Signs ED Triage Vitals  Encounter Vitals Group     BP 08/23/23 1800 124/87     Systolic BP Percentile --      Diastolic BP Percentile --      Pulse Rate 08/23/23 1800 95     Resp 08/23/23 1800 18     Temp 08/23/23 1800 98.4 F (36.9 C)     Temp Source 08/23/23 1800 Oral     SpO2 08/23/23 1800 96 %     Weight --      Height --      Head  Circumference --      Peak Flow --      Pain Score 08/23/23 1758 10     Pain Loc --      Pain Education --      Exclude from Growth Chart --    No data found.  Updated Vital Signs BP 124/87 (BP Location: Left Arm)   Pulse 95   Temp 98.4 F (36.9 C) (Oral)   Resp 18   LMP 08/29/2022 (Exact Date)   SpO2 96%   Visual Acuity Right Eye Distance:   Left Eye Distance:   Bilateral Distance:    Right Eye Near:   Left Eye Near:    Bilateral Near:     Physical Exam Constitutional:      Appearance: Normal appearance.  HENT:     Head: Normocephalic and atraumatic.     Nose: No congestion or rhinorrhea.  Eyes:     Extraocular Movements: Extraocular movements intact.     Pupils: Pupils are equal, round, and reactive to light.  Cardiovascular:     Rate and Rhythm: Normal rate and regular rhythm.  Pulmonary:     Effort: Pulmonary effort is normal.     Breath sounds: Normal breath sounds.  Musculoskeletal:     Right knee: No swelling. Tenderness present over the medial joint line, lateral joint line and patellar tendon.  Skin:    Capillary Refill: Capillary refill takes less than 2 seconds.     Findings: Rash (Multiple excoriated areas on bilateral extremities neck and legs) present.  Neurological:     General: No focal deficit present.     Mental Status: She is alert.  Psychiatric:        Mood and Affect: Mood is anxious.     Comments: Skin picking disorder      UC Treatments / Results  Labs (all labs ordered are listed, but only abnormal results are displayed) Labs Reviewed - No data to display  EKG   Radiology DG Knee Complete 4 Views Right Result Date: 08/23/2023 CLINICAL DATA:  Right knee pain, no known injury. EXAM: RIGHT KNEE - COMPLETE 4+ VIEW COMPARISON:  None Available. FINDINGS: No evidence of fracture, dislocation, or joint effusion. No evidence of arthropathy or other  focal bone abnormality. Soft tissues are unremarkable. IMPRESSION: Negative.  Electronically Signed   By: Janeece Mechanic M.D.   On: 08/23/2023 20:54    Procedures Procedures (including critical care time)  Medications Ordered in UC Medications - No data to display  Initial Impression / Assessment and Plan / UC Course  I have reviewed the triage vital signs and the nursing notes.  Pertinent labs & imaging results that were available during my care of the patient were reviewed by me and considered in my medical decision making (see chart for details).    Patient here with right knee strain, unremarkable for any abnormal findings of degenerative changes. Given patient's level of discomfort with ambulation we will treat with prednisone 20 mg once daily for 5 days.  For acute pain tizanidine 4 mg at bedtime.  Final Clinical Impressions(s) / UC Diagnoses   Final diagnoses:  Knee strain, right, initial encounter     Discharge Instructions      Prednisone tomorrow will decrease the inflammation and improve your ability to stand and walk without pain.  Recommend wearing a knee sleeve and working this will prevent you from having recurrent knee pain.  Tizanidine can be taken at bedtime this is a muscle relaxer to help with pain.'s worsen or do not improve follow-up with one of the orthopedic providers listed.     ED Prescriptions     Medication Sig Dispense Auth. Provider   predniSONE (DELTASONE) 20 MG tablet Take 2 tablets (40 mg total) by mouth daily with breakfast for 5 days. 10 tablet Buena Carmine, NP   tiZANidine (ZANAFLEX) 4 MG tablet Take 1 tablet (4 mg total) by mouth at bedtime as needed and may repeat dose one time if needed for muscle spasms (Knee pain). 30 tablet Buena Carmine, NP      PDMP not reviewed this encounter.   Buena Carmine, NP 08/26/23 [phone]

## 2023-08-23 NOTE — Discharge Instructions (Addendum)
 Prednisone tomorrow will decrease the inflammation and improve your ability to stand and walk without pain.  Recommend wearing a knee sleeve and working this will prevent you from having recurrent knee pain.  Tizanidine can be taken at bedtime this is a muscle relaxer to help with pain.'s worsen or do not improve follow-up with one of the orthopedic providers listed.

## 2023-08-23 NOTE — ED Triage Notes (Addendum)
 Pt reports right knee pain x 2 weeks, getting worse. Denies injury. States she walks on concrete at work which makes the pain worse. She has tried tylenol, aleve, ibuprofen, hot/cold, and home remedies without relief. States she has trouble with her knees since childhood due to her height (6" tall in 6th grade). Rash noted to bilateral arms- pt states she "has a picking problem"
